# Patient Record
Sex: Female | Born: 1955 | Race: White | Hispanic: No | Marital: Married | Smoking: Never smoker
Health system: Southern US, Community
[De-identification: ages and names within clinical notes are randomized; demographics above are authoritative.]

## PROBLEM LIST (undated history)

## (undated) DIAGNOSIS — I10 Essential (primary) hypertension: Secondary | ICD-10-CM

## (undated) DIAGNOSIS — E785 Hyperlipidemia, unspecified: Secondary | ICD-10-CM

## (undated) DIAGNOSIS — M1990 Unspecified osteoarthritis, unspecified site: Secondary | ICD-10-CM

## (undated) DIAGNOSIS — E1169 Type 2 diabetes mellitus with other specified complication: Secondary | ICD-10-CM

## (undated) DIAGNOSIS — K219 Gastro-esophageal reflux disease without esophagitis: Secondary | ICD-10-CM

## (undated) DIAGNOSIS — F419 Anxiety disorder, unspecified: Secondary | ICD-10-CM

## (undated) DIAGNOSIS — C801 Malignant (primary) neoplasm, unspecified: Secondary | ICD-10-CM

## (undated) DIAGNOSIS — R51 Headache: Secondary | ICD-10-CM

## (undated) DIAGNOSIS — N390 Urinary tract infection, site not specified: Secondary | ICD-10-CM

## (undated) DIAGNOSIS — E119 Type 2 diabetes mellitus without complications: Secondary | ICD-10-CM

## (undated) HISTORY — PX: ABDOMINAL HYSTERECTOMY: SHX81

## (undated) HISTORY — PX: RECTOCELE REPAIR: SHX761

## (undated) HISTORY — PX: KNEE ARTHROSCOPY: SUR90

## (undated) HISTORY — PX: CHOLECYSTECTOMY: SHX55

## (undated) HISTORY — PX: TOOTH EXTRACTION: SUR596

## (undated) HISTORY — PX: ENTEROCELE REPAIR: SHX623

## (undated) HISTORY — PX: TONSILLECTOMY: SUR1361

---
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
## 1987-09-05 HISTORY — PX: CHOLECYSTECTOMY: SHX55

## 2004-06-21 ENCOUNTER — Other Ambulatory Visit: Admission: RE | Admit: 2004-06-21 | Discharge: 2004-06-21 | Payer: Self-pay | Admitting: Gynecology

## 2004-06-29 ENCOUNTER — Encounter: Admission: RE | Admit: 2004-06-29 | Discharge: 2004-06-29 | Payer: Self-pay | Admitting: Gynecology

## 2004-11-14 ENCOUNTER — Encounter: Admission: RE | Admit: 2004-11-14 | Discharge: 2004-11-14 | Payer: Self-pay | Admitting: Orthopedic Surgery

## 2004-11-28 ENCOUNTER — Encounter: Admission: RE | Admit: 2004-11-28 | Discharge: 2004-11-28 | Payer: Self-pay | Admitting: Orthopedic Surgery

## 2005-02-28 ENCOUNTER — Encounter: Admission: RE | Admit: 2005-02-28 | Discharge: 2005-02-28 | Payer: Self-pay | Admitting: Gynecology

## 2005-06-23 ENCOUNTER — Other Ambulatory Visit: Admission: RE | Admit: 2005-06-23 | Discharge: 2005-06-23 | Payer: Self-pay | Admitting: Gynecology

## 2005-07-13 ENCOUNTER — Encounter: Payer: Self-pay | Admitting: Gynecology

## 2005-07-18 ENCOUNTER — Inpatient Hospital Stay (HOSPITAL_COMMUNITY): Admission: AD | Admit: 2005-07-18 | Discharge: 2005-07-19 | Payer: Self-pay | Admitting: Gynecology

## 2005-08-09 ENCOUNTER — Encounter: Admission: RE | Admit: 2005-08-09 | Discharge: 2005-08-09 | Payer: Self-pay | Admitting: Gynecology

## 2008-09-04 HISTORY — PX: COLONOSCOPY: SHX174

## 2009-09-04 DIAGNOSIS — C801 Malignant (primary) neoplasm, unspecified: Secondary | ICD-10-CM

## 2009-09-04 HISTORY — DX: Malignant (primary) neoplasm, unspecified: C80.1

## 2009-09-04 HISTORY — PX: ABDOMINAL HYSTERECTOMY: SHX81

## 2010-06-02 ENCOUNTER — Ambulatory Visit: Admission: RE | Admit: 2010-06-02 | Discharge: 2010-06-02 | Payer: Self-pay | Admitting: Gynecologic Oncology

## 2011-01-20 NOTE — Op Note (Signed)
[name], Lindsey [name] NO.:  [id number]   MEDICAL RECORD NO.:  [id number]          PATIENT TYPE:  AMB   LOCATION:  DAY                          FACILITY:  [facility]   PHYSICIAN:  Gretta Cool, M.D. 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   PROCEDURE:  Enterocele, rectocele repair, colposuspension with levator plate  repair.   SURGEON:  Gretta Cool, M.D.   ASSISTANT:  Almedia Balls. [name], M.D.   ANESTHESIA:  General oral tracheal.   DESCRIPTION OF PROCEDURE:  Under excellent general anesthesia with the  patient prepped and draped in the lithotomy position in [place] stirrups, the  procedure was begun by an incision in the posterior fourchette and then the  vaginal mucosa was undermined from the introitus to the apex of the vagina.  The Allis clamps were placed on the cut edges of the vaginal mucosa. The  mucosa was then dissected by blunt and sharp dissection from the mucosa.  Once all of the defects were identified, the cardinal uterosacral complex  was used to secure the detached vaginal fascia. The fascia was secured with  mattress suture of 2-0 Novofil. The remaining interim between the two  ligaments fascial detachment was repaired with interrupted sutures of #0  Vicryl. The fascia was secured to the base of the cervix posteriorly from  whence it had been torn. At this point, all of the fascial defects were  individually site specific repaired. Next, a suture of #0 Vicryl was used to  plicate the endopelvic fascia from uterosacral complex on the right to the  left. The entire posterior compartment endopelvic fascia was then  plicated  in the midline from the introitus to the apex of the vagina. The perineal  body muscles were then individually plicated with 2-0 Vicryl. The mucosa was  then trimmed and the upper layers of endopelvic fascia and mucosa closed by  subcuticular closure using 2-0 Vicryl all the way to the introitus. The  perineal incision was then closed with a running suture of 2-0 Vicryl.  At  this point, the bladder was filled with approximately 400 mL of irrigation  fluid and [place] suprapubic Cystocath placed and secured with 2-0 Novofil.  At this point, the procedure was terminated without complication. The  patient returned to the recovery room in excellent condition. Her introitus  was generous by digital opening. All of pelvic floor seemed very well  supported.          ______________________________  Gretta Cool, M.D.    CWL/MEDQ  D:  07/18/2005  T:  07/18/2005  Job:  [id number]   cc:   [facility]

## 2011-01-20 NOTE — H&P (Signed)
Lindsey [name], Lindsey [name] NO.:  [id number]   MEDICAL RECORD NO.:  [id number]          PATIENT TYPE:  INP   LOCATION:  9305                          FACILITY:  [facility]   PHYSICIAN:  Gretta Cool, M.D. DATE OF BIRTH:  08/08/1956   DATE OF ADMISSION:  07/18/2005  DATE OF DISCHARGE:                                HISTORY & PHYSICAL   CHIEF COMPLAINT:  Pelvic support problems.   HISTORY OF PRESENT ILLNESS:  A 55 year old, gravida 2, para 2, with a  history of enterocele and rectocele, and fascial detachment with levator  plate weakness, and widening of the genital hiatus.  She reports obstetric  difficulty particularly with her first child, and progressive difficulty  with emptying her bowel, and with bulge of her rectum through the introitus.  She has had progressive worsening of her pelvic organ prolapse.  She has a  predominantly posterior compartment descent and weakness.  She has no  significant difficulties with incontinence.  She is now admitted for  definitive therapy of a grade 3 to 4 rectocele, enterocele, and severe  fascial detachment with levator diastasis.  She is now admitted for  definitive therapy by posterior and enterocele repair, colposuspension  repair of left levator plate tears.   PAST MEDICAL HISTORY:  Usual childhood diseases without sequelae.   MEDICAL ILLNESSES:  None.   PREVIOUS SURGERY:  1.  T&A [facility].  2.  Tubal ligation, 1981.  3.  Cholecystectomy, 1989   ALLERGIES:  ERYTHROMYCIN.   She denies ethanol, tobacco, substance abuse.   FAMILY HISTORY:  Father has type 2 diabetes.  Her maternal grandmother had  lung cancer.  Father has had skin cancers.  No other familial tendencies.  Two sisters living and well.   REVIEW OF SYSTEMS:  HEENT:  Denies symptoms.  CARDIORESPIRATORY:  Denies  asthma, cough, bronchitis, shortness of breath.  GI/GU:  Denies frequency,  urgency, dysuria, change in bowel habits, food intolerance.   She does have  to splint to achieve evacuation of her bowel   PHYSICAL EXAMINATION:  GENERAL:  A well-developed, well-nourished, white  female significantly over ideal weight with BMI of 33.  HEENT:  Pupils equal, react to light and accommodate.  Fundi benign.  Oropharynx clear.  NECK:  Supple without masses or enlargement.  CHEST:  Clear __________  .  BREASTS:  Without masses, nodes, nipple discharge.  Node bearing areas  negative.  HEART:  Regular rhythm without murmur or cardiac enlargement.  ABDOMEN:  Soft with a large panniculus but without mass or organomegaly  palpable.  PELVIC:  External genitalia normal female.  Vagina, widening of the genital  hiatus.  She has evidence of levator plate separation at level I.  Her upper  levators appear fine.  She has severe fascial detachment from the apex of  the vagina to the cervix and descent of the fascia to the lower third of the  vagina.  Her rectocele can be pulled out through the introitus and bulges  there with straining.  Cervix seems reasonably well supported.  Adnexa  clear.  Anterior compartment  is all well-supported.  Rectovaginal exam  confirms.  EXTREMITIES:  Negative.  NEUROLOGIC:  Physiologic.   IMPRESSION:  1.  Pelvic organ prolapse with enterocele/rectocele, grade 3.  2.  Fascial detachment and levator plate separation.  3.  Overweight with BMI 33.  4.  Perimenopausal.  5.  Hypertension on verapamil and Dyazide.   RECOMMENDATIONS:  Definitive therapy as above. The risks, benefits, and  alternatives discussed in detail.  Options and alternatives __________           ______________________________  Gretta Cool, M.D.     CWL/MEDQ  D:  07/19/2005  T:  07/19/2005  Job:  [id number]   cc:   [facility]

## 2012-09-04 HISTORY — PX: ABDOMINAL HYSTERECTOMY: SHX81

## 2013-03-04 DIAGNOSIS — D3910 Neoplasm of uncertain behavior of unspecified ovary: Secondary | ICD-10-CM

## 2013-03-04 DIAGNOSIS — C569 Malignant neoplasm of unspecified ovary: Secondary | ICD-10-CM | POA: Insufficient documentation

## 2013-03-04 HISTORY — DX: Neoplasm of uncertain behavior of unspecified ovary: D39.10

## 2013-03-25 ENCOUNTER — Other Ambulatory Visit: Payer: Self-pay | Admitting: Orthopedic Surgery

## 2013-03-25 MED ORDER — BUPIVACAINE LIPOSOME 1.3 % IJ SUSP: 20.0000 mL | Freq: Once | INTRAMUSCULAR | Status: DC

## 2013-03-25 MED ORDER — DEXAMETHASONE SODIUM PHOSPHATE 10 MG/ML IJ SOLN: 10.0000 mg | Freq: Once | INTRAMUSCULAR | Status: DC

## 2013-03-25 NOTE — Progress Notes (Signed)
Preoperative surgical orders have been place into the Epic hospital system for Lindsey [name] on 03/25/2013, 6:37 PM  by Patrica Duel for surgery on 04/07/2013.  Preop Total Knee orders including Experal, PO Tylenol, and IV Decadron as long as there are no contraindications to the above medications. [name], PA-C

## 2013-03-26 ENCOUNTER — Encounter (HOSPITAL_COMMUNITY): Payer: Self-pay | Admitting: Pharmacy Technician

## 2013-03-31 ENCOUNTER — Other Ambulatory Visit (HOSPITAL_COMMUNITY): Payer: Self-pay | Admitting: *Deleted

## 2013-04-01 ENCOUNTER — Encounter (HOSPITAL_COMMUNITY)
Admission: RE | Admit: 2013-04-01 | Discharge: 2013-04-01 | Disposition: A | Discharge status: Home / Self Care | Payer: BC Managed Care – PPO | Source: Ambulatory Visit | Attending: Orthopedic Surgery | Admitting: Orthopedic Surgery

## 2013-04-01 ENCOUNTER — Encounter (HOSPITAL_COMMUNITY): Payer: Self-pay

## 2013-04-01 ENCOUNTER — Other Ambulatory Visit: Payer: Self-pay | Admitting: Orthopedic Surgery

## 2013-04-01 DIAGNOSIS — Z01810 Encounter for preprocedural cardiovascular examination: Secondary | ICD-10-CM | POA: Insufficient documentation

## 2013-04-01 DIAGNOSIS — R9431 Abnormal electrocardiogram [ECG] [EKG]: Secondary | ICD-10-CM | POA: Insufficient documentation

## 2013-04-01 DIAGNOSIS — Z01812 Encounter for preprocedural laboratory examination: Secondary | ICD-10-CM | POA: Insufficient documentation

## 2013-04-01 HISTORY — DX: Gastro-esophageal reflux disease without esophagitis: K21.9

## 2013-04-01 HISTORY — DX: Unspecified osteoarthritis, unspecified site: M19.90

## 2013-04-01 HISTORY — DX: Type 2 diabetes mellitus without complications: E11.9

## 2013-04-01 HISTORY — DX: Essential (primary) hypertension: I10

## 2013-04-01 HISTORY — DX: Urinary tract infection, site not specified: N39.0

## 2013-04-01 HISTORY — DX: Malignant (primary) neoplasm, unspecified: C80.1

## 2013-04-01 HISTORY — DX: Headache: R51

## 2013-04-01 LAB — COMPREHENSIVE METABOLIC PANEL
ALT: 20 U/L (ref 0–35)
AST: 20 U/L (ref 0–37)
Albumin: 4 g/dL (ref 3.5–5.2)
Alkaline Phosphatase: 94 U/L (ref 39–117)
BUN: 14 mg/dL (ref 6–23)
CO2: 27 mEq/L (ref 19–32)
Calcium: 10.2 mg/dL (ref 8.4–10.5)
Chloride: 98 mEq/L (ref 96–112)
Creatinine, Ser: 0.73 mg/dL (ref 0.50–1.10)
GFR calc Af Amer: >90 mL/min (ref >90)
GFR calc non Af Amer: >90 mL/min (ref >90)
Glucose, Bld: 120 mg/dL — ABNORMAL HIGH (ref 70–99)
Potassium: 3.9 mEq/L (ref 3.5–5.1)
Sodium: 136 mEq/L (ref 135–145)
Total Bilirubin: 0.3 mg/dL (ref 0.3–1.2)
Total Protein: 7.5 g/dL (ref 6.0–8.3)

## 2013-04-01 LAB — CBC
HCT: 41 % (ref 36.0–46.0)
Hemoglobin: 13.6 g/dL (ref 12.0–15.0)
MCH: 27.6 pg (ref 26.0–34.0)
MCHC: 33.2 g/dL (ref 30.0–36.0)
MCV: 83.3 fL (ref 78.0–100.0)
Platelets: 436 10*3/uL — ABNORMAL HIGH (ref 150–400)
RBC: 4.92 MIL/uL (ref 3.87–5.11)
RDW: 13.6 % (ref 11.5–15.5)
WBC: 9.7 10*3/uL (ref 4.0–10.5)

## 2013-04-01 LAB — URINALYSIS, ROUTINE W REFLEX MICROSCOPIC
Bilirubin Urine: NEGATIVE
Glucose, UA: NEGATIVE mg/dL
Hgb urine dipstick: NEGATIVE
Ketones, ur: NEGATIVE mg/dL
Leukocytes, UA: NEGATIVE
Nitrite: NEGATIVE
Protein, ur: NEGATIVE mg/dL
Specific Gravity, Urine: 1.009 (ref 1.005–1.030)
Urobilinogen, UA: 0.2 mg/dL (ref 0.0–1.0)
pH: 6 (ref 5.0–8.0)

## 2013-04-01 LAB — APTT: aPTT: 31 seconds (ref 24–37)

## 2013-04-01 LAB — SURGICAL PCR SCREEN
MRSA, PCR: NEGATIVE
Staphylococcus aureus: NEGATIVE

## 2013-04-01 LAB — PROTIME-INR
INR: 0.92 (ref 0.00–1.49)
Prothrombin Time: 12.2 seconds (ref 11.6–15.2)

## 2013-04-01 NOTE — Progress Notes (Signed)
Chest x ray, EKG 11/13 chart

## 2013-04-01 NOTE — Patient Instructions (Addendum)
20 KARYS MECKLEY  04/01/2013   Your procedure is scheduled on:  04/07/13  MONDAY  Report to [facility] at    0530   AM.  Call this number if you have problems the morning of surgery: [phone]       Remember:   Do not eat food  Or drink :After Midnight. Sunday NIGHT   Take these medicines the morning of surgery with A SIP OF WATER:  PROLISEC,  VERAPAMIL                          DO NOT take any diabetes medicine morning of surgery  .  Contacts, dentures or partial plates can not be worn to surgery  Leave suitcase in the car. After surgery it may be brought to your room.  For patients admitted to the hospital, checkout time is 11:00 AM day of  discharge.             SPECIAL INSTRUCTIONS- SEE [place] PREPARING FOR SURGERY INSTRUCTION SHEET-     DO NOT WEAR JEWELRY, LOTIONS, POWDERS, OR PERFUMES.  WOMEN-- DO NOT SHAVE LEGS OR UNDERARMS FOR 12 HOURS BEFORE SHOWERS. MEN MAY SHAVE FACE.  Patients discharged the day of surgery will not be allowed to drive home. IF going home the day of surgery, you must have a driver and someone to stay with you for the first 24 hours  Name and phone number of your driver:       ADMISSION                                                                 Please read over the following fact sheets that you were given: MRSA Information, Incentive Spirometry Sheet, Blood Transfusion Sheet  Information                                                                                   Effa Yarrow  PST 336  [phone]                 FAILURE TO FOLLOW THESE INSTRUCTIONS MAY RESULT IN  CANCELLATION   OF YOUR SURGERY                                                  Patient Signature _____________________________

## 2013-04-01 NOTE — Progress Notes (Signed)
EKG 11/13 reviewed with report medical history by dr Rica Mast. Ordered repeat EKG today.  Attempted to obtain old EKG from [facility] for review- left message

## 2013-04-06 ENCOUNTER — Other Ambulatory Visit: Payer: Self-pay | Admitting: Orthopedic Surgery

## 2013-04-06 NOTE — H&P (Signed)
Lindsey [name]  DOB: 05-28-1956 Married / Language: English / Race: White Female  Date of Admission:  04/07/2013  Chief Complaint:  Right Knee Pain  History of Present Illness The patient is a 57 year old female who comes in for a preoperative History and Physical. The patient is scheduled for a right total knee arthroplasty to be performed by Dr. Gus Rankin. Aluisio, MD at [facility] on 05/12/2013. The patient is a 57 year old female who presents with knee complaints. The patient was seen for a second opinion. The patient reports right knee symptoms including: pain which began 6 year(s) (or more) ago without any known injury (although she has had multiple falls over the years). Prior to being seen, the patient was previously evaluated by a colleague (at [employer]) 1 year(s) ago. Previous work-up for this problem has included knee x-rays. Past treatment for this problem has included intra-articular injection of corticosteroids (and Supartz). Note for "Knee pain": She had surgery on the knee in 2008 in [place]. She was not able to obtain any records from that. She states the knee is very painful now, which is keeping her from doing the things she needs to do on a daily basis. The patient's knee has gotten progressively worse over time. It is now hurting her at all times. It is limited what she can and cannot do. She is concerned about it giving out. She has had cortisone injections as well as Visco supplement injections with no benefit. She is at a stage now where she needs to do something to get this knee better. She wants to be more active, but the knee is preventing her from doing so. She is ready to proceed with surgery. They have been treated conservatively in the past for the above stated problem and despite conservative measures, they continue to have progressive pain and severe functional limitations and dysfunction. They have failed non-operative management  including home exercise, medications, and injections. It is felt that they would benefit from undergoing total joint replacement. Risks and benefits of the procedure have been discussed with the patient and they elect to proceed with surgery. There are no active contraindications to surgery such as ongoing infection or rapidly progressive neurological disease.   Problem List Primary osteoarthritis of one knee (715.16)   Allergies Sulfanilamide *CHEMICALS*. Hives, Vomiting. Erythromycin *MACROLIDES*. Hives, Nausea. TraMADol HCl *ANALGESICS - OPIOID*. Nausea, Dizziness.   Family History Cerebrovascular Accident. grandfather fathers side Cancer. grandmother mothers side Congestive Heart Failure. grandmother mothers side Chronic Obstructive Lung Disease. grandmother mothers side Diabetes Mellitus. grandfather fathers side Heart Disease. grandmother mothers side, grandfather mothers side, grandmother fathers side and grandfather fathers side Drug / Alcohol Addiction. sister Hypertension. mother and sister Heart disease in female family member before age 74   Social History Illicit drug use. no Exercise. Exercises monthly; does individual sport Marital status. married Living situation. live with spouse Drug/Alcohol Rehab (Currently). no Alcohol use. current drinker; only occasionally per week Current work status. working full time Children. 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 1 Tobacco use. never smoker Previously in rehab. no   Medication History Ambien (10MG  Tablet, Oral) Active. Imitrex (100MG  Tablet, Oral) Active. (prn) Pravastatin Sodium (40MG  Tablet, Oral) Active. MetFORMIN HCl ER (500MG  Tablet ER 24HR, Oral) Active. Verapamil HCl ER (240MG  Tablet ER, Oral) Active. Lisinopril-Hydrochlorothiazide (20-25MG  Tablet, Oral) Active. Omeprazole (20MG   Tablet DR, Oral) Active. Fish Oil (1200MG  Capsule, Oral)  Active. Aspirin EC (81MG  Tablet DR, Oral) Active.   Past Surgical History Tubal Ligation. Date: 4. Gallbladder Surgery. Date: 32. laporoscopic Arthroscopy of Knee. Date: 2008. right Tonsillectomy. Date: 31. Hysterectomy. Date: 2011. complete (non-cancerous) Enterocele / Rectocele Repair. Date: 2006.   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 Past History   Review of Systems General:Present- Night Sweats. Not Present- Chills, Fever, Fatigue, Weight Gain, Weight Loss and Memory Loss. Skin:Not Present- Hives, Itching, Rash, Eczema and Lesions. HEENT:Present- Tinnitus. Not Present- Headache, Double Vision, Visual Loss, Hearing Loss and Dentures. Respiratory:Not Present- Shortness of breath with exertion, Shortness of breath at rest, Allergies, Coughing up blood and Chronic Cough. Cardiovascular:Not Present- Chest Pain, Racing/skipping heartbeats, Difficulty Breathing Lying Down, Murmur, Swelling and Palpitations. Gastrointestinal:Present- Heartburn. Not Present- Bloody Stool, Abdominal Pain, Vomiting, Nausea, Constipation, Diarrhea, Difficulty Swallowing, Jaundice and Loss of appetitie. Female Genitourinary:Present- Urinating at Night. Not Present- Blood in Urine, Urinary frequency, Weak urinary stream, Discharge, Flank Pain, Incontinence, Painful Urination, Urgency and Urinary Retention. Musculoskeletal:Present- Muscle Pain, Joint Pain, Back Pain, Morning Stiffness and Spasms. Not Present- Muscle Weakness and Joint Swelling. Neurological:Not Present- Tremor, Dizziness, Blackout spells, Paralysis, Difficulty with balance and Weakness. Psychiatric:Not Present- Insomnia.   Vitals Weight: 193  lb Height: 65 in Weight was reported by patient. Height was reported by patient. Body Surface Area: 2 m Body Mass Index: 32.12 kg/m Pulse: 92 (Regular) Resp.: 16 (Unlabored) BP: 134/78 (Sitting, Right Arm, Standard)    Physical Exam The physical exam findings are as follows:  Note: Patient is a 57 year old female with continued right knee pain.   General Mental Status - Alert, cooperative and good historian. General Appearance- pleasant. Not in acute distress. Orientation- Oriented X3. Build & Nutrition- Well nourished and Well developed.   Head and Neck Head- normocephalic, atraumatic . Neck Global Assessment- supple. no bruit auscultated on the right and no bruit auscultated on the left.   Eye Vision- Wears corrective lenses. Pupil- Bilateral- Regular and Round. Motion- Bilateral- EOMI.   Chest and Lung Exam Auscultation: Breath sounds:- clear at anterior chest wall and - clear at posterior chest wall. Adventitious sounds:- No Adventitious sounds.   Cardiovascular Auscultation:Rhythm- Regular rate and rhythm. Heart Sounds- S1 WNL and S2 WNL. Murmurs & Other Heart Sounds:Auscultation of the heart reveals - No Murmurs.   Abdomen Palpation/Percussion:Tenderness- Abdomen is non-tender to palpation. Rigidity (guarding)- Abdomen is soft. Auscultation:Auscultation of the abdomen reveals - Bowel sounds normal.   Female Genitourinary Not done, not pertinent to present illness  Musculoskeletal  On exam she is alert and oriented with no apparent distress. Her hips show normal range of motion with no discomfort. The left knee no effusion. Range about 0 to 125 with no tenderness or instability. Right knee no effusion. Varus deformity noted. Range 5 to 120 with moderate crepitus on range of motion. Tenderness medial greater than lateral with no instability noted.  RADIOGRAPHS: Reviewed AP both knees and lateral showing  significant bone on bone arthritis of the medial and patellofemoral compartments of that right knee.  Assessment & Plan Primary osteoarthritis of one knee (715.16) Impression: Right Knee  Note: Plan is for a Right Total Knee Replacement by Dr. Lequita Halt.  Plan is to go home following surgery.  PCP - Dr. Mickey Farber and Gus Height PA-C - Patient has been seen preoperatively and felt to be stable for surgery.  The patient does not have any contraindications and will recieve TXA (tranexamic acid) prior to surgery.  Time Spent ~ 20 minutes  Signed electronically by Lauraine Rinne, III PA-C

## 2013-04-07 ENCOUNTER — Encounter (HOSPITAL_COMMUNITY)
Admission: RE | Disposition: A | Discharge status: Home Health | Payer: Self-pay | Source: Ambulatory Visit | Attending: Orthopedic Surgery

## 2013-04-07 ENCOUNTER — Inpatient Hospital Stay (HOSPITAL_COMMUNITY): Payer: BC Managed Care – PPO | Admitting: Anesthesiology

## 2013-04-07 ENCOUNTER — Inpatient Hospital Stay (HOSPITAL_COMMUNITY)
Admission: RE | Admit: 2013-04-07 | Discharge: 2013-04-09 | DRG: 209 | Disposition: A | Discharge status: Home Health | Payer: BC Managed Care – PPO | Source: Ambulatory Visit | Attending: Orthopedic Surgery | Admitting: Orthopedic Surgery

## 2013-04-07 ENCOUNTER — Encounter (HOSPITAL_COMMUNITY): Payer: Self-pay | Admitting: *Deleted

## 2013-04-07 ENCOUNTER — Encounter (HOSPITAL_COMMUNITY): Payer: Self-pay | Admitting: Anesthesiology

## 2013-04-07 DIAGNOSIS — Z96651 Presence of right artificial knee joint: Secondary | ICD-10-CM

## 2013-04-07 DIAGNOSIS — K219 Gastro-esophageal reflux disease without esophagitis: Secondary | ICD-10-CM | POA: Diagnosis present

## 2013-04-07 DIAGNOSIS — Z6832 Body mass index (BMI) 32.0-32.9, adult: Secondary | ICD-10-CM

## 2013-04-07 DIAGNOSIS — E7800 Pure hypercholesterolemia, unspecified: Secondary | ICD-10-CM | POA: Diagnosis present

## 2013-04-07 DIAGNOSIS — E119 Type 2 diabetes mellitus without complications: Secondary | ICD-10-CM | POA: Diagnosis present

## 2013-04-07 DIAGNOSIS — M1710 Unilateral primary osteoarthritis, unspecified knee: Principal | ICD-10-CM | POA: Diagnosis present

## 2013-04-07 DIAGNOSIS — Z01812 Encounter for preprocedural laboratory examination: Secondary | ICD-10-CM

## 2013-04-07 DIAGNOSIS — I10 Essential (primary) hypertension: Secondary | ICD-10-CM | POA: Diagnosis present

## 2013-04-07 DIAGNOSIS — I152 Hypertension secondary to endocrine disorders: Secondary | ICD-10-CM | POA: Diagnosis present

## 2013-04-07 DIAGNOSIS — E669 Obesity, unspecified: Secondary | ICD-10-CM | POA: Diagnosis present

## 2013-04-07 DIAGNOSIS — D62 Acute posthemorrhagic anemia: Secondary | ICD-10-CM | POA: Diagnosis not present

## 2013-04-07 DIAGNOSIS — E871 Hypo-osmolality and hyponatremia: Secondary | ICD-10-CM | POA: Diagnosis not present

## 2013-04-07 HISTORY — PX: TOTAL KNEE ARTHROPLASTY: SHX125

## 2013-04-07 LAB — GLUCOSE, CAPILLARY
Glucose-Capillary: 125 mg/dL — ABNORMAL HIGH (ref 70–99)
Glucose-Capillary: 114 mg/dL — ABNORMAL HIGH (ref 70–99)
Glucose-Capillary: 115 mg/dL — ABNORMAL HIGH (ref 70–99)
Glucose-Capillary: 157 mg/dL — ABNORMAL HIGH (ref 70–99)
Glucose-Capillary: 195 mg/dL — ABNORMAL HIGH (ref 70–99)

## 2013-04-07 LAB — TYPE AND SCREEN
ABO/RH(D): O NEG
Antibody Screen: NEGATIVE

## 2013-04-07 LAB — ABO/RH: ABO/RH(D): O NEG

## 2013-04-07 SURGERY — ARTHROPLASTY, KNEE, TOTAL
Anesthesia: General | Site: Knee | Laterality: Right | Wound class: Clean

## 2013-04-07 MED ORDER — DOCUSATE SODIUM 100 MG PO CAPS
100.0000 mg | ORAL_CAPSULE | Freq: Two times a day (BID) | ORAL | Status: DC
  Administered 2013-04-07 – 2013-04-09 (×4): 100 mg via ORAL

## 2013-04-07 MED ORDER — MORPHINE SULFATE 2 MG/ML IJ SOLN
1.0000 mg | INTRAMUSCULAR | Status: DC | PRN
  Administered 2013-04-07 (×2): 2 mg via INTRAVENOUS
  Filled 2013-04-07 (×2): qty 1

## 2013-04-07 MED ORDER — PHENOL 1.4 % MT LIQD: 1.0000 | OROMUCOSAL | Status: DC | PRN

## 2013-04-07 MED ORDER — ONDANSETRON HCL 4 MG/2ML IJ SOLN: 4.0000 mg | Freq: Four times a day (QID) | INTRAMUSCULAR | Status: DC | PRN

## 2013-04-07 MED ORDER — METFORMIN HCL 500 MG PO TABS
500.0000 mg | ORAL_TABLET | Freq: Two times a day (BID) | ORAL | Status: DC
  Administered 2013-04-07 – 2013-04-08 (×2): 500 mg via ORAL
  Filled 2013-04-07 (×4): qty 1

## 2013-04-07 MED ORDER — SODIUM CHLORIDE 0.9 % IR SOLN
Status: DC | PRN
  Administered 2013-04-07: 1000 mL

## 2013-04-07 MED ORDER — MIDAZOLAM HCL 5 MG/5ML IJ SOLN
INTRAMUSCULAR | Status: DC | PRN
  Administered 2013-04-07: 2 mg via INTRAVENOUS

## 2013-04-07 MED ORDER — ZOLPIDEM TARTRATE 5 MG PO TABS
5.0000 mg | ORAL_TABLET | Freq: Every evening | ORAL | Status: DC | PRN
  Administered 2013-04-07 – 2013-04-08 (×2): 5 mg via ORAL
  Filled 2013-04-07: qty 1
  Filled 2013-04-07: qty 2

## 2013-04-07 MED ORDER — PROPOFOL INFUSION 10 MG/ML OPTIME
INTRAVENOUS | Status: DC | PRN
  Administered 2013-04-07: 75 ug/kg/min via INTRAVENOUS

## 2013-04-07 MED ORDER — BISACODYL 10 MG RE SUPP: 10.0000 mg | Freq: Every day | RECTAL | Status: DC | PRN

## 2013-04-07 MED ORDER — MENTHOL 3 MG MT LOZG: 1.0000 | LOZENGE | OROMUCOSAL | Status: DC | PRN

## 2013-04-07 MED ORDER — ONDANSETRON HCL 4 MG PO TABS: 4.0000 mg | ORAL_TABLET | Freq: Four times a day (QID) | ORAL | Status: DC | PRN

## 2013-04-07 MED ORDER — METHOCARBAMOL 500 MG PO TABS
500.0000 mg | ORAL_TABLET | Freq: Four times a day (QID) | ORAL | Status: DC | PRN
  Administered 2013-04-07 – 2013-04-09 (×5): 500 mg via ORAL
  Filled 2013-04-07 (×7): qty 1

## 2013-04-07 MED ORDER — METHOCARBAMOL 100 MG/ML IJ SOLN
500.0000 mg | Freq: Four times a day (QID) | INTRAVENOUS | Status: DC | PRN
  Filled 2013-04-07: qty 5

## 2013-04-07 MED ORDER — PROMETHAZINE HCL 25 MG/ML IJ SOLN: 6.2500 mg | INTRAMUSCULAR | Status: DC | PRN

## 2013-04-07 MED ORDER — DEXAMETHASONE 4 MG PO TABS
10.0000 mg | ORAL_TABLET | Freq: Every day | ORAL | Status: AC
  Administered 2013-04-08: 10 mg via ORAL
  Filled 2013-04-07: qty 1

## 2013-04-07 MED ORDER — DEXAMETHASONE SODIUM PHOSPHATE 10 MG/ML IJ SOLN
10.0000 mg | Freq: Once | INTRAMUSCULAR | Status: AC
  Administered 2013-04-07: 10 mg via INTRAVENOUS

## 2013-04-07 MED ORDER — METOCLOPRAMIDE HCL 5 MG PO TABS
5.0000 mg | ORAL_TABLET | Freq: Three times a day (TID) | ORAL | Status: DC | PRN
  Filled 2013-04-07: qty 2

## 2013-04-07 MED ORDER — METOCLOPRAMIDE HCL 5 MG/ML IJ SOLN: 5.0000 mg | Freq: Three times a day (TID) | INTRAMUSCULAR | Status: DC | PRN

## 2013-04-07 MED ORDER — BUPIVACAINE IN DEXTROSE 0.75-8.25 % IT SOLN
INTRATHECAL | Status: DC | PRN
  Administered 2013-04-07: 2 mL via INTRATHECAL

## 2013-04-07 MED ORDER — SIMVASTATIN 20 MG PO TABS: 20.0000 mg | ORAL_TABLET | Freq: Every day | ORAL | Status: DC

## 2013-04-07 MED ORDER — ELETRIPTAN HYDROBROMIDE 40 MG PO TABS
40.0000 mg | ORAL_TABLET | ORAL | Status: DC | PRN
  Filled 2013-04-07: qty 1

## 2013-04-07 MED ORDER — TRANEXAMIC ACID 100 MG/ML IV SOLN
1000.0000 mg | INTRAVENOUS | Status: AC
  Administered 2013-04-07: 1000 mg via INTRAVENOUS
  Filled 2013-04-07: qty 10

## 2013-04-07 MED ORDER — ONDANSETRON HCL 4 MG/2ML IJ SOLN
INTRAMUSCULAR | Status: DC | PRN
  Administered 2013-04-07: 4 mg via INTRAVENOUS

## 2013-04-07 MED ORDER — ACETAMINOPHEN 500 MG PO TABS
1000.0000 mg | ORAL_TABLET | Freq: Four times a day (QID) | ORAL | Status: AC
  Administered 2013-04-07 – 2013-04-08 (×4): 1000 mg via ORAL
  Filled 2013-04-07 (×4): qty 2

## 2013-04-07 MED ORDER — DEXAMETHASONE SODIUM PHOSPHATE 10 MG/ML IJ SOLN
10.0000 mg | Freq: Every day | INTRAMUSCULAR | Status: AC
  Filled 2013-04-07: qty 1

## 2013-04-07 MED ORDER — BUPIVACAINE LIPOSOME 1.3 % IJ SUSP
INTRAMUSCULAR | Status: DC | PRN
  Administered 2013-04-07: 20 mL

## 2013-04-07 MED ORDER — SODIUM CHLORIDE 0.9 % IJ SOLN
INTRAMUSCULAR | Status: DC | PRN
  Administered 2013-04-07: 30 mL

## 2013-04-07 MED ORDER — ATORVASTATIN CALCIUM 10 MG PO TABS
10.0000 mg | ORAL_TABLET | Freq: Every day | ORAL | Status: DC
  Administered 2013-04-07 – 2013-04-08 (×2): 10 mg via ORAL
  Filled 2013-04-07 (×3): qty 1

## 2013-04-07 MED ORDER — OXYCODONE HCL 5 MG PO TABS
5.0000 mg | ORAL_TABLET | ORAL | Status: DC | PRN
  Administered 2013-04-07 – 2013-04-09 (×12): 10 mg via ORAL
  Filled 2013-04-07 (×12): qty 2

## 2013-04-07 MED ORDER — ACETAMINOPHEN 500 MG PO TABS
1000.0000 mg | ORAL_TABLET | Freq: Once | ORAL | Status: AC
  Administered 2013-04-07: 1000 mg via ORAL
  Filled 2013-04-07: qty 2

## 2013-04-07 MED ORDER — SODIUM CHLORIDE 0.9 % IV SOLN: INTRAVENOUS | Status: DC

## 2013-04-07 MED ORDER — LIDOCAINE HCL (CARDIAC) 20 MG/ML IV SOLN
INTRAVENOUS | Status: DC | PRN
  Administered 2013-04-07: 100 mg via INTRAVENOUS

## 2013-04-07 MED ORDER — CEFAZOLIN SODIUM-DEXTROSE 2-3 GM-% IV SOLR
2.0000 g | INTRAVENOUS | Status: AC
  Administered 2013-04-07: 2 g via INTRAVENOUS

## 2013-04-07 MED ORDER — LACTATED RINGERS IV SOLN
INTRAVENOUS | Status: DC | PRN
  Administered 2013-04-07: 08:00:00 via INTRAVENOUS

## 2013-04-07 MED ORDER — BUPIVACAINE LIPOSOME 1.3 % IJ SUSP
20.0000 mL | Freq: Once | INTRAMUSCULAR | Status: DC
  Filled 2013-04-07: qty 20

## 2013-04-07 MED ORDER — STERILE WATER FOR IRRIGATION IR SOLN
Status: DC | PRN
  Administered 2013-04-07: 3000 mL

## 2013-04-07 MED ORDER — CEFAZOLIN SODIUM 1-5 GM-% IV SOLN
1.0000 g | Freq: Four times a day (QID) | INTRAVENOUS | Status: AC
  Administered 2013-04-07 (×2): 1 g via INTRAVENOUS
  Filled 2013-04-07 (×2): qty 50

## 2013-04-07 MED ORDER — POTASSIUM CHLORIDE IN NACL 20-0.9 MEQ/L-% IV SOLN
INTRAVENOUS | Status: DC
  Administered 2013-04-07: 1000 mL via INTRAVENOUS
  Filled 2013-04-07 (×3): qty 1000

## 2013-04-07 MED ORDER — FENTANYL CITRATE 0.05 MG/ML IJ SOLN
INTRAMUSCULAR | Status: DC | PRN
  Administered 2013-04-07: 50 ug via INTRAVENOUS

## 2013-04-07 MED ORDER — BUPIVACAINE HCL 0.25 % IJ SOLN
INTRAMUSCULAR | Status: DC | PRN
  Administered 2013-04-07: 20 mL

## 2013-04-07 MED ORDER — TRAMADOL HCL 50 MG PO TABS: 50.0000 mg | ORAL_TABLET | Freq: Four times a day (QID) | ORAL | Status: DC | PRN

## 2013-04-07 MED ORDER — KETOROLAC TROMETHAMINE 15 MG/ML IJ SOLN
15.0000 mg | Freq: Four times a day (QID) | INTRAMUSCULAR | Status: AC | PRN
  Administered 2013-04-07: 15 mg via INTRAVENOUS
  Filled 2013-04-07: qty 1

## 2013-04-07 MED ORDER — HYDROMORPHONE HCL PF 1 MG/ML IJ SOLN: 0.2500 mg | INTRAMUSCULAR | Status: DC | PRN

## 2013-04-07 MED ORDER — SUMATRIPTAN SUCCINATE 100 MG PO TABS
100.0000 mg | ORAL_TABLET | ORAL | Status: DC | PRN
  Filled 2013-04-07: qty 1

## 2013-04-07 MED ORDER — POLYETHYLENE GLYCOL 3350 17 G PO PACK: 17.0000 g | PACK | Freq: Every day | ORAL | Status: DC | PRN

## 2013-04-07 MED ORDER — VERAPAMIL HCL ER 240 MG PO TBCR
240.0000 mg | EXTENDED_RELEASE_TABLET | Freq: Every day | ORAL | Status: DC
  Administered 2013-04-08 – 2013-04-09 (×2): 240 mg via ORAL
  Filled 2013-04-07 (×4): qty 1

## 2013-04-07 MED ORDER — INSULIN ASPART 100 UNIT/ML ~~LOC~~ SOLN
0.0000 [IU] | Freq: Three times a day (TID) | SUBCUTANEOUS | Status: DC
  Administered 2013-04-08: 2 [IU] via SUBCUTANEOUS
  Administered 2013-04-08 (×2): 3 [IU] via SUBCUTANEOUS
  Administered 2013-04-08: 2 [IU] via SUBCUTANEOUS
  Administered 2013-04-09: 3 [IU] via SUBCUTANEOUS

## 2013-04-07 MED ORDER — RIVAROXABAN 10 MG PO TABS
10.0000 mg | ORAL_TABLET | Freq: Every day | ORAL | Status: DC
  Administered 2013-04-08 – 2013-04-09 (×2): 10 mg via ORAL
  Filled 2013-04-07 (×3): qty 1

## 2013-04-07 MED ORDER — FLEET ENEMA 7-19 GM/118ML RE ENEM: 1.0000 | ENEMA | Freq: Once | RECTAL | Status: AC | PRN

## 2013-04-07 MED ORDER — PANTOPRAZOLE SODIUM 40 MG PO TBEC
40.0000 mg | DELAYED_RELEASE_TABLET | Freq: Every day | ORAL | Status: DC
  Administered 2013-04-07: 40 mg via ORAL
  Filled 2013-04-07 (×2): qty 1

## 2013-04-07 MED ORDER — DIPHENHYDRAMINE HCL 12.5 MG/5ML PO ELIX: 12.5000 mg | ORAL_SOLUTION | ORAL | Status: DC | PRN

## 2013-04-07 MED ORDER — 0.9 % SODIUM CHLORIDE (POUR BTL) OPTIME
TOPICAL | Status: DC | PRN
  Administered 2013-04-07: 1000 mL

## 2013-04-07 SURGICAL SUPPLY — 58 items
BAG SPEC THK2 15X12 ZIP CLS (MISCELLANEOUS) ×1
BAG ZIPLOCK 12X15 (MISCELLANEOUS) ×2 IMPLANT
BANDAGE ELASTIC 6 VELCRO ST LF (GAUZE/BANDAGES/DRESSINGS) ×2 IMPLANT
BANDAGE ESMARK 6X9 LF (GAUZE/BANDAGES/DRESSINGS) ×1 IMPLANT
BLADE SAG 18X100X1.27 (BLADE) ×2 IMPLANT
BLADE SAW SGTL 11.0X1.19X90.0M (BLADE) ×2 IMPLANT
BNDG CMPR 9X6 STRL LF SNTH (GAUZE/BANDAGES/DRESSINGS) ×1
BNDG ESMARK 6X9 LF (GAUZE/BANDAGES/DRESSINGS) ×2
BOWL SMART MIX CTS (DISPOSABLE) ×2 IMPLANT
CEMENT HV SMART SET (Cement) ×4 IMPLANT
CLOTH BEACON ORANGE TIMEOUT ST (SAFETY) ×2 IMPLANT
CUFF TOURN SGL QUICK 34 (TOURNIQUET CUFF) ×2
CUFF TRNQT CYL 34X4X40X1 (TOURNIQUET CUFF) ×1 IMPLANT
DECANTER SPIKE VIAL GLASS SM (MISCELLANEOUS) ×2 IMPLANT
DRAPE EXTREMITY T 121X128X90 (DRAPE) ×2 IMPLANT
DRAPE POUCH INSTRU U-SHP 10X18 (DRAPES) ×2 IMPLANT
DRAPE U-SHAPE 47X51 STRL (DRAPES) ×2 IMPLANT
DRSG ADAPTIC 3X8 NADH LF (GAUZE/BANDAGES/DRESSINGS) ×2 IMPLANT
DRSG EMULSION OIL 3X16 NADH (GAUZE/BANDAGES/DRESSINGS) ×1 IMPLANT
DRSG PAD ABDOMINAL 8X10 ST (GAUZE/BANDAGES/DRESSINGS) ×2 IMPLANT
DURAPREP 26ML APPLICATOR (WOUND CARE) ×2 IMPLANT
ELECT REM PT RETURN 9FT ADLT (ELECTROSURGICAL) ×2
ELECTRODE REM PT RTRN 9FT ADLT (ELECTROSURGICAL) ×1 IMPLANT
EVACUATOR 1/8 PVC DRAIN (DRAIN) ×2 IMPLANT
FACESHIELD LNG OPTICON STERILE (SAFETY) ×10 IMPLANT
GLOVE BIO SURGEON STRL SZ7.5 (GLOVE) IMPLANT
GLOVE BIO SURGEON STRL SZ8 (GLOVE) ×2 IMPLANT
GLOVE BIOGEL PI IND STRL 8 (GLOVE) ×1 IMPLANT
GLOVE BIOGEL PI INDICATOR 8 (GLOVE) ×1
GLOVE SURG SS PI 6.5 STRL IVOR (GLOVE) IMPLANT
GOWN STRL NON-REIN LRG LVL3 (GOWN DISPOSABLE) ×2 IMPLANT
GOWN STRL REIN XL XLG (GOWN DISPOSABLE) IMPLANT
HANDPIECE INTERPULSE COAX TIP (DISPOSABLE) ×2
IMMOBILIZER KNEE 20 (SOFTGOODS) ×4
IMMOBILIZER KNEE 20 THIGH 36 (SOFTGOODS) ×1 IMPLANT
KIT BASIN OR (CUSTOM PROCEDURE TRAY) ×2 IMPLANT
MANIFOLD NEPTUNE II (INSTRUMENTS) ×2 IMPLANT
NDL SAFETY ECLIPSE 18X1.5 (NEEDLE) ×2 IMPLANT
NEEDLE HYPO 18GX1.5 SHARP (NEEDLE) ×4
NS IRRIG 1000ML POUR BTL (IV SOLUTION) ×2 IMPLANT
PACK ICE MAXI GEL EZY WRAP (MISCELLANEOUS) ×1 IMPLANT
PACK TOTAL JOINT (CUSTOM PROCEDURE TRAY) ×2 IMPLANT
PADDING CAST COTTON 6X4 STRL (CAST SUPPLIES) ×5 IMPLANT
POSITIONER SURGICAL ARM (MISCELLANEOUS) ×2 IMPLANT
SET HNDPC FAN SPRY TIP SCT (DISPOSABLE) ×1 IMPLANT
SPONGE GAUZE 4X4 12PLY (GAUZE/BANDAGES/DRESSINGS) ×2 IMPLANT
STRIP CLOSURE SKIN 1/2X4 (GAUZE/BANDAGES/DRESSINGS) ×4 IMPLANT
SUCTION FRAZIER 12FR DISP (SUCTIONS) ×2 IMPLANT
SUT MNCRL AB 4-0 PS2 18 (SUTURE) ×2 IMPLANT
SUT VIC AB 2-0 CT1 27 (SUTURE) ×6
SUT VIC AB 2-0 CT1 TAPERPNT 27 (SUTURE) ×3 IMPLANT
SUT VLOC 180 0 24IN GS25 (SUTURE) ×2 IMPLANT
SYR 20CC LL (SYRINGE) ×2 IMPLANT
SYR 50ML LL SCALE MARK (SYRINGE) ×2 IMPLANT
TOWEL OR 17X26 10 PK STRL BLUE (TOWEL DISPOSABLE) ×4 IMPLANT
TRAY FOLEY CATH 14FRSI W/METER (CATHETERS) ×2 IMPLANT
WATER STERILE IRR 1500ML POUR (IV SOLUTION) ×2 IMPLANT
WRAP KNEE MAXI GEL POST OP (GAUZE/BANDAGES/DRESSINGS) ×2 IMPLANT

## 2013-04-07 NOTE — Op Note (Signed)
Pre-operative diagnosis- Osteoarthritis  Right knee(s)  Post-operative diagnosis- Osteoarthritis Right knee(s)  Procedure-  Right  Total Knee Arthroplasty  Surgeon- Gus Rankin. Troyce Gieske, MD  Assistant- [name], PA-C   Anesthesia-  Spinal EBL-* No blood loss amount entered *  Drains Hemovac  Tourniquet time-  Total Tourniquet Time Documented: Thigh (Right) - 34 minutes Total: Thigh (Right) - 34 minutes    Complications- None  Condition-PACU - hemodynamically stable.   Brief Clinical Note  Lindsey [name] is a 57 y.o. year old female with end stage OA of her right knee with progressively worsening pain and dysfunction. She has constant pain, with activity and at rest and significant functional deficits with difficulties even with ADLs. She has had extensive non-op management including analgesics, injections of cortisone and viscosupplements, and home exercise program, but remains in significant pain with significant dysfunction.Radiographs show bone on bone arthritis medial and patellofemoral. She presents now for right Total Knee Arthroplasty.    Procedure in detail---   The patient is brought into the operating room and positioned supine on the operating table. After successful administration of  Spinal,   a tourniquet is placed high on the  Right thigh(s) and the lower extremity is prepped and draped in the usual sterile fashion. Time out is performed by the operating team and then the  Right lower extremity is wrapped in Esmarch, knee flexed and the tourniquet inflated to 300 mmHg.       A midline incision is made with a ten blade through the subcutaneous tissue to the level of the extensor mechanism. A fresh blade is used to make a medial parapatellar arthrotomy. Soft tissue over the proximal medial tibia is subperiosteally elevated to the joint line with a knife and into the semimembranosus bursa with a Cobb elevator. Soft tissue over the proximal lateral tibia is elevated with  attention being paid to avoiding the patellar tendon on the tibial tubercle. The patella is everted, knee flexed 90 degrees and the ACL and PCL are removed. Findings are bone on bone medial and patellofemoral with large medial osteophytes.        The drill is used to create a starting hole in the distal femur and the canal is thoroughly irrigated with sterile saline to remove the fatty contents. The 5 degree Right  valgus alignment guide is placed into the femoral canal and the distal femoral cutting block is pinned to remove 10 mm off the distal femur. Resection is made with an oscillating saw.      The tibia is subluxed forward and the menisci are removed. The extramedullary alignment guide is placed referencing proximally at the medial aspect of the tibial tubercle and distally along the second metatarsal axis and tibial crest. The block is pinned to remove 2mm off the more deficient medial  side. Resection is made with an oscillating saw. Size 3is the most appropriate size for the tibia and the proximal tibia is prepared with the modular drill and keel punch for that size.      The femoral sizing guide is placed and size 3 is most appropriate. Rotation is marked off the epicondylar axis and confirmed by creating a rectangular flexion gap at 90 degrees. The size 3 cutting block is pinned in this rotation and the anterior, posterior and chamfer cuts are made with the oscillating saw. The intercondylar block is then placed and that cut is made.      Trial size 3 tibial component, trial size 3 posterior  stabilized femur and a 12.5  mm posterior stabilized rotating platform insert trial is placed. Full extension is achieved with excellent varus/valgus and anterior/posterior balance throughout full range of motion. The patella is everted and thickness measured to be 22  mm. Free hand resection is taken to 12 mm, a 35 template is placed, lug holes are drilled, trial patella is placed, and it tracks normally.  Osteophytes are removed off the posterior femur with the trial in place. All trials are removed and the cut bone surfaces prepared with pulsatile lavage. Cement is mixed and once ready for implantation, the size 3 tibial implant, size  3 posterior stabilized femoral component, and the size 35 patella are cemented in place and the patella is held with the clamp. The trial insert is placed and the knee held in full extension. The Exparel (20 ml mixed with 30 ml saline) and .25% Bupivicaine, are injected into the extensor mechanism, posterior capsule, medial and lateral gutters and subcutaneous tissues.  All extruded cement is removed and once the cement is hard the permanent 12.5 mm posterior stabilized rotating platform insert is placed into the tibial tray.      The wound is copiously irrigated with saline solution and the extensor mechanism closed over a hemovac drain with #1 PDS suture. The tourniquet is released for a total tourniquet time of 33  minutes. Flexion against gravity is 140 degrees and the patella tracks normally. Subcutaneous tissue is closed with 2.0 vicryl and subcuticular with running 4.0 Monocryl. The incision is cleaned and dried and steri-strips and a bulky sterile dressing are applied. The limb is placed into a knee immobilizer and the patient is awakened and transported to recovery in stable condition.      Please note that a surgical assistant was a medical necessity for this procedure in order to perform it in a safe and expeditious manner. Surgical assistant was necessary to retract the ligaments and vital neurovascular structures to prevent injury to them and also necessary for proper positioning of the limb to allow for anatomic placement of the prosthesis.   Gus Rankin Lindsey Mellone, MD    04/07/2013, 9:17 AM

## 2013-04-07 NOTE — Evaluation (Signed)
Physical Therapy Evaluation Patient Details Name: Lindsey [name] MRN: [id number] DOB: Jan 04, 1956 Today's Date: 04/07/2013 Time: 4098-1191 PT Time Calculation (min): 13 min  PT Assessment / Plan / Recommendation History of Present Illness  S/P R TKA 8/4  Clinical Impression  On eval POD 0, pt required Min assist for mobility-able to ambulate ~50 feet with RW. Anticipate pt will progress well during stay. Recommend HHPT, RW. Husband will assist at home.     PT Assessment  Patient needs continued PT services    Follow Up Recommendations  Home health PT    Does the patient have the potential to tolerate intense rehabilitation      Barriers to Discharge        Equipment Recommendations  Rolling walker with 5" wheels    Recommendations for Other Services OT consult   Frequency 7X/week    Precautions / Restrictions Precautions Precautions: Knee;Fall Restrictions Weight Bearing Restrictions: No RLE Weight Bearing: Weight bearing as tolerated   Pertinent Vitals/Pain 4/10 R knee.       Mobility  Bed Mobility Bed Mobility: Supine to Sit;Sit to Supine Supine to Sit: 4: Min assist Sit to Supine: 4: Min assist Details for Bed Mobility Assistance: assist for R LE Transfers Transfers: Sit to Stand;Stand to Sit Sit to Stand: 4: Min assist Stand to Sit: 4: Min guard Details for Transfer Assistance: Assist to rise, stabilize. VCs safety, technique, hand placement Ambulation/Gait Ambulation/Gait Assistance: 4: Min guard Ambulation Distance (Feet): 50 Feet Assistive device: Rolling walker Ambulation/Gait Assistance Details: VCs safety, technique, sequence. Gait Pattern: Step-to pattern;Decreased stride length;Antalgic;Decreased step length - right    Exercises     PT Diagnosis: Difficulty walking;Abnormality of gait;Acute pain  PT Problem List: Decreased strength;Decreased range of motion;Decreased activity tolerance;Decreased mobility;Decreased knowledge of use of  DME;Decreased knowledge of precautions;Pain PT Treatment Interventions: DME instruction;Gait training;Stair training;Functional mobility training;Therapeutic activities;Therapeutic exercise;Patient/family education     PT Goals(Current goals can be found in the care plan section) Acute Rehab PT Goals Patient Stated Goal: regain independence. home PT Goal Formulation: With patient/family Time For Goal Achievement: 04/14/13 Potential to Achieve Goals: Good  Visit Information  Last PT Received On: 04/07/13 Assistance Needed: +1 History of Present Illness: S/P R TKA 8/4       Prior Functioning  Home Living Family/patient expects to be discharged to:: Private residence Living Arrangements: Spouse/significant other Available Help at Discharge: Family Type of Home: House Home Access: Stairs to enter Secretary/administrator of Steps: 3-garage Entrance Stairs-Rails: Left Home Layout: One level Home Equipment: None Prior Function Level of Independence: Independent Communication Communication: No difficulties    Cognition  Cognition Arousal/Alertness: Awake/alert Behavior During Therapy: WFL for tasks assessed/performed Overall Cognitive Status: Within Functional Limits for tasks assessed    Extremity/Trunk Assessment Upper Extremity Assessment Upper Extremity Assessment: Overall WFL for tasks assessed Lower Extremity Assessment Lower Extremity Assessment: RLE deficits/detail RLE Deficits / Details: hip flex 3/5, moves ankle well Cervical / Trunk Assessment Cervical / Trunk Assessment: Normal   Balance    End of Session PT - End of Session Activity Tolerance: Patient tolerated treatment well Patient left: in bed;with call bell/phone within reach;with family/visitor present CPM Right Knee CPM Right Knee: Off  GP     Rebeca Alert, MPT Pager: [phone]

## 2013-04-07 NOTE — Anesthesia Procedure Notes (Signed)
Spinal  Patient location during procedure: OR Start time: 04/07/2013 8:20 AM Staffing Anesthesiologist: Azell Der Performed by: anesthesiologist  Preanesthetic Checklist Completed: patient identified, site marked, surgical consent, pre-op evaluation, timeout performed, IV checked, risks and benefits discussed and monitors and equipment checked Spinal Block Patient position: sitting Prep: Betadine Patient monitoring: heart rate, continuous pulse ox and blood pressure Approach: midline Location: L3-4 Injection technique: single-shot Needle Needle type: Sprotte  Needle gauge: 24 G Needle length: 9 cm Additional Notes Expiration date of kit checked and confirmed. Patient tolerated procedure well, without complications. Clear CSF. No paresthesias.

## 2013-04-07 NOTE — Anesthesia Postprocedure Evaluation (Signed)
  Anesthesia Post-op Note  Patient: Lindsey [name]  Procedure(s) Performed: Procedure(s) (LRB): RIGHT TOTAL KNEE ARTHROPLASTY (Right)  Patient Location: PACU  Anesthesia Type: Spinal  Level of Consciousness: awake and alert   Airway and Oxygen Therapy: Patient Spontanous Breathing  Post-op Pain: mild  Post-op Assessment: Post-op Vital signs reviewed, Patient's Cardiovascular Status Stable, Respiratory Function Stable, Patent Airway and No signs of Nausea or vomiting  Last Vitals:  Filed Vitals:   04/07/13 1240  BP: 124/77  Pulse: 82  Temp: 36.6 C  Resp: 14    Post-op Vital Signs: stable   Complications: No apparent anesthesia complications. Spinal receding normally.

## 2013-04-07 NOTE — Transfer of Care (Signed)
Immediate Anesthesia Transfer of Care Note  Patient: Lindsey [name]  Procedure(s) Performed: Procedure(s): RIGHT TOTAL KNEE ARTHROPLASTY (Right)  Patient Location: PACU  Anesthesia Type:Spinal  Level of Consciousness: awake, alert , oriented and patient cooperative  Airway & Oxygen Therapy: Patient Spontanous Breathing and Patient connected to face mask oxygen  Post-op Assessment: Report given to PACU RN and Post -op Vital signs reviewed and stable  Post vital signs: Reviewed and stable  Complications: No apparent anesthesia complications

## 2013-04-07 NOTE — H&P (View-Only) (Signed)
Lindsey [name]  DOB: 06/17/1956 Married / Language: English / Race: White Female  Date of Admission:  04/07/2013  Chief Complaint:  Right Knee Pain  History of Present Illness The patient is a 57 year old female who comes in for a preoperative History and Physical. The patient is scheduled for a right total knee arthroplasty to be performed by Dr. Frank V. Aluisio, MD at [facility] on 05/12/2013. The patient is a 57 year old female who presents with knee complaints. The patient was seen for a second opinion. The patient reports right knee symptoms including: pain which began 6 year(s) (or more) ago without any known injury (although she has had multiple falls over the years). Prior to being seen, the patient was previously evaluated by a colleague (at [facility]) 1 year(s) ago. Previous work-up for this problem has included knee x-rays. Past treatment for this problem has included intra-articular injection of corticosteroids (and Supartz). Note for "Knee pain": She had surgery on the knee in 2008 in [place]. She was not able to obtain any records from that. She states the knee is very painful now, which is keeping her from doing the things she needs to do on a daily basis. The patient's knee has gotten progressively worse over time. It is now hurting her at all times. It is limited what she can and cannot do. She is concerned about it giving out. She has had cortisone injections as well as Visco supplement injections with no benefit. She is at a stage now where she needs to do something to get this knee better. She wants to be more active, but the knee is preventing her from doing so. She is ready to proceed with surgery. They have been treated conservatively in the past for the above stated problem and despite conservative measures, they continue to have progressive pain and severe functional limitations and dysfunction. They have failed non-operative management  including home exercise, medications, and injections. It is felt that they would benefit from undergoing total joint replacement. Risks and benefits of the procedure have been discussed with the patient and they elect to proceed with surgery. There are no active contraindications to surgery such as ongoing infection or rapidly progressive neurological disease.   Problem List Primary osteoarthritis of one knee (715.16)   Allergies Sulfanilamide *CHEMICALS*. Hives, Vomiting. Erythromycin *MACROLIDES*. Hives, Nausea. TraMADol HCl *ANALGESICS - OPIOID*. Nausea, Dizziness.   Family History Cerebrovascular Accident. grandfather fathers side Cancer. grandmother mothers side Congestive Heart Failure. grandmother mothers side Chronic Obstructive Lung Disease. grandmother mothers side Diabetes Mellitus. grandfather fathers side Heart Disease. grandmother mothers side, grandfather mothers side, grandmother fathers side and grandfather fathers side Drug / Alcohol Addiction. sister Hypertension. mother and sister Heart disease in female family member before age 65   Social History Illicit drug use. no Exercise. Exercises monthly; does individual sport Marital status. married Living situation. live with spouse Drug/Alcohol Rehab (Currently). no Alcohol use. current drinker; only occasionally per week Current work status. working full time Children. 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 no Number of flights of stairs before winded. 1 Tobacco use. never smoker Previously in rehab. no   Medication History Ambien (10MG Tablet, Oral) Active. Imitrex (100MG Tablet, Oral) Active. (prn) Pravastatin Sodium (40MG Tablet, Oral) Active. MetFORMIN HCl ER (500MG Tablet ER 24HR, Oral) Active. Verapamil HCl ER (240MG Tablet ER, Oral) Active. Lisinopril-Hydrochlorothiazide (20-25MG Tablet, Oral) Active. Omeprazole (20MG  Tablet DR, Oral) Active. Fish Oil (1200MG Capsule, Oral)   Active. Aspirin EC (81MG Tablet DR, Oral) Active.   Past Surgical History Tubal Ligation. Date: 1991. Gallbladder Surgery. Date: 1989. laporoscopic Arthroscopy of Knee. Date: 2008. right Tonsillectomy. Date: 1975. Hysterectomy. Date: 2011. complete (non-cancerous) Enterocele / Rectocele Repair. Date: 2006.   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 Past History   Review of Systems General:Present- Night Sweats. Not Present- Chills, Fever, Fatigue, Weight Gain, Weight Loss and Memory Loss. Skin:Not Present- Hives, Itching, Rash, Eczema and Lesions. HEENT:Present- Tinnitus. Not Present- Headache, Double Vision, Visual Loss, Hearing Loss and Dentures. Respiratory:Not Present- Shortness of breath with exertion, Shortness of breath at rest, Allergies, Coughing up blood and Chronic Cough. Cardiovascular:Not Present- Chest Pain, Racing/skipping heartbeats, Difficulty Breathing Lying Down, Murmur, Swelling and Palpitations. Gastrointestinal:Present- Heartburn. Not Present- Bloody Stool, Abdominal Pain, Vomiting, Nausea, Constipation, Diarrhea, Difficulty Swallowing, Jaundice and Loss of appetitie. Female Genitourinary:Present- Urinating at Night. Not Present- Blood in Urine, Urinary frequency, Weak urinary stream, Discharge, Flank Pain, Incontinence, Painful Urination, Urgency and Urinary Retention. Musculoskeletal:Present- Muscle Pain, Joint Pain, Back Pain, Morning Stiffness and Spasms. Not Present- Muscle Weakness and Joint Swelling. Neurological:Not Present- Tremor, Dizziness, Blackout spells, Paralysis, Difficulty with balance and Weakness. Psychiatric:Not Present- Insomnia.   Vitals Weight: 193  lb Height: 65 in Weight was reported by patient. Height was reported by patient. Body Surface Area: 2 m Body Mass Index: 32.12 kg/m Pulse: 92 (Regular) Resp.: 16 (Unlabored) BP: 134/78 (Sitting, Right Arm, Standard)    Physical Exam The physical exam findings are as follows:  Note: Patient is a 57 year old female with continued right knee pain.   General Mental Status - Alert, cooperative and good historian. General Appearance- pleasant. Not in acute distress. Orientation- Oriented X3. Build & Nutrition- Well nourished and Well developed.   Head and Neck Head- normocephalic, atraumatic . Neck Global Assessment- supple. no bruit auscultated on the right and no bruit auscultated on the left.   Eye Vision- Wears corrective lenses. Pupil- Bilateral- Regular and Round. Motion- Bilateral- EOMI.   Chest and Lung Exam Auscultation: Breath sounds:- clear at anterior chest wall and - clear at posterior chest wall. Adventitious sounds:- No Adventitious sounds.   Cardiovascular Auscultation:Rhythm- Regular rate and rhythm. Heart Sounds- S1 WNL and S2 WNL. Murmurs & Other Heart Sounds:Auscultation of the heart reveals - No Murmurs.   Abdomen Palpation/Percussion:Tenderness- Abdomen is non-tender to palpation. Rigidity (guarding)- Abdomen is soft. Auscultation:Auscultation of the abdomen reveals - Bowel sounds normal.   Female Genitourinary Not done, not pertinent to present illness  Musculoskeletal  On exam she is alert and oriented with no apparent distress. Her hips show normal range of motion with no discomfort. The left knee no effusion. Range about 0 to 125 with no tenderness or instability. Right knee no effusion. Varus deformity noted. Range 5 to 120 with moderate crepitus on range of motion. Tenderness medial greater than lateral with no instability noted.  RADIOGRAPHS: Reviewed AP both knees and lateral showing  significant bone on bone arthritis of the medial and patellofemoral compartments of that right knee.  Assessment & Plan Primary osteoarthritis of one knee (715.16) Impression: Right Knee  Note: Plan is for a Right Total Knee Replacement by Dr. Aluisio.  Plan is to go home following surgery.  PCP - Dr. Kristen Cox and Andrea Johnson PA-C - Patient has been seen preoperatively and felt to be stable for surgery.    The patient does not have any contraindications and will recieve TXA (tranexamic acid) prior to surgery.  Time Spent ~ 20 minutes  Signed electronically by Carisma Troupe L Job Holtsclaw, III PA-C  

## 2013-04-07 NOTE — Interval H&P Note (Signed)
History and Physical Interval Note:  04/07/2013 6:55 AM  Lindsey [name]  has presented today for surgery, with the diagnosis of OSTEOARTHRITIS RIGHT KNEE   The various methods of treatment have been discussed with the patient and family. After consideration of risks, benefits and other options for treatment, the patient has consented to  Procedure(s): RIGHT TOTAL KNEE ARTHROPLASTY (Right) as a surgical intervention .  The patient's history has been reviewed, patient examined, no change in status, stable for surgery.  I have reviewed the patient's chart and labs.  Questions were answered to the patient's satisfaction.     Loanne Drilling

## 2013-04-07 NOTE — Preoperative (Signed)
Beta Blockers   Reason not to administer Beta Blockers:Not Applicable 

## 2013-04-07 NOTE — Anesthesia Preprocedure Evaluation (Addendum)
Anesthesia Evaluation  Patient identified by MRN, date of birth, ID band Patient awake    Reviewed: Allergy & Precautions, H&P , NPO status , Patient's Chart, lab work & pertinent test results  History of Anesthesia Complications (+) MALIGNANT HYPERTHERMIA  Airway Mallampati: II TM Distance: >3 FB Neck ROM: Full    Dental no notable dental hx.    Pulmonary neg pulmonary ROS,  breath sounds clear to auscultation  Pulmonary exam normal       Cardiovascular Exercise Tolerance: Good hypertension, Pt. on medications negative cardio ROS  Rhythm:Regular Rate:Normal     Neuro/Psych  Headaches, negative psych ROS   GI/Hepatic negative GI ROS, Neg liver ROS, GERD-  Medicated,  Endo/Other  diabetes, Type 2, Oral Hypoglycemic Agents  Renal/GU negative Renal ROS  negative genitourinary   Musculoskeletal negative musculoskeletal ROS (+)   Abdominal (+) + obese,   Peds negative pediatric ROS (+)  Hematology negative hematology ROS (+)   Anesthesia Other Findings   Reproductive/Obstetrics negative OB ROS                          Anesthesia Physical Anesthesia Plan  ASA: III  Anesthesia Plan: General   Post-op Pain Management:    Induction: Intravenous  Airway Management Planned:   Additional Equipment:   Intra-op Plan:   Post-operative Plan: Extubation in OR  Informed Consent: I have reviewed the patients History and Physical, chart, labs and discussed the procedure including the risks, benefits and alternatives for the proposed anesthesia with the patient or authorized representative who has indicated his/her understanding and acceptance.   Dental advisory given  Plan Discussed with: CRNA  Anesthesia Plan Comments: (Discussed general versus spinal. Discussed risks/benefits of spinal including headache, backache, failure, bleeding, infection, and nerve damage. Patient consents to spinal.  Questions answered. Coagulation studies and platelet count acceptable.)        Anesthesia Quick Evaluation

## 2013-04-08 ENCOUNTER — Encounter (HOSPITAL_COMMUNITY): Payer: Self-pay | Admitting: Orthopedic Surgery

## 2013-04-08 DIAGNOSIS — I10 Essential (primary) hypertension: Secondary | ICD-10-CM | POA: Diagnosis present

## 2013-04-08 DIAGNOSIS — K219 Gastro-esophageal reflux disease without esophagitis: Secondary | ICD-10-CM | POA: Diagnosis present

## 2013-04-08 DIAGNOSIS — I152 Hypertension secondary to endocrine disorders: Secondary | ICD-10-CM | POA: Diagnosis present

## 2013-04-08 DIAGNOSIS — D62 Acute posthemorrhagic anemia: Secondary | ICD-10-CM | POA: Diagnosis not present

## 2013-04-08 DIAGNOSIS — E119 Type 2 diabetes mellitus without complications: Secondary | ICD-10-CM | POA: Diagnosis present

## 2013-04-08 LAB — CBC
HCT: 32.5 % — ABNORMAL LOW (ref 36.0–46.0)
Hemoglobin: 10.8 g/dL — ABNORMAL LOW (ref 12.0–15.0)
MCH: 27.6 pg (ref 26.0–34.0)
MCHC: 33.2 g/dL (ref 30.0–36.0)
MCV: 82.9 fL (ref 78.0–100.0)
Platelets: 360 10*3/uL (ref 150–400)
RBC: 3.92 MIL/uL (ref 3.87–5.11)
RDW: 13.7 % (ref 11.5–15.5)
WBC: 14.6 10*3/uL — ABNORMAL HIGH (ref 4.0–10.5)

## 2013-04-08 LAB — BASIC METABOLIC PANEL
BUN: 14 mg/dL (ref 6–23)
CO2: 26 mEq/L (ref 19–32)
Calcium: 8.8 mg/dL (ref 8.4–10.5)
Chloride: 100 mEq/L (ref 96–112)
Creatinine, Ser: 0.77 mg/dL (ref 0.50–1.10)
GFR calc Af Amer: >90 mL/min (ref >90)
GFR calc non Af Amer: >90 mL/min (ref >90)
Glucose, Bld: 156 mg/dL — ABNORMAL HIGH (ref 70–99)
Potassium: 4.1 mEq/L (ref 3.5–5.1)
Sodium: 135 mEq/L (ref 135–145)

## 2013-04-08 LAB — GLUCOSE, CAPILLARY
Glucose-Capillary: 139 mg/dL — ABNORMAL HIGH (ref 70–99)
Glucose-Capillary: 132 mg/dL — ABNORMAL HIGH (ref 70–99)
Glucose-Capillary: 169 mg/dL — ABNORMAL HIGH (ref 70–99)
Glucose-Capillary: 205 mg/dL — ABNORMAL HIGH (ref 70–99)

## 2013-04-08 MED ORDER — OMEPRAZOLE 20 MG PO CPDR
20.0000 mg | DELAYED_RELEASE_CAPSULE | Freq: Every day | ORAL | Status: DC
  Administered 2013-04-08 – 2013-04-09 (×2): 20 mg via ORAL
  Filled 2013-04-08 (×2): qty 1

## 2013-04-08 MED ORDER — NON FORMULARY: 20.0000 mg | Freq: Every day | Status: DC

## 2013-04-08 NOTE — Progress Notes (Signed)
Utilization review completed.  

## 2013-04-08 NOTE — Progress Notes (Signed)
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  Pt OOB in recliner.  Amb in hallway, assisted to BR then performed some TE's due to increased pain level.  Applied ICE.   Follow Up Recommendations  Home health PT     Does the patient have the potential to tolerate intense rehabilitation     Barriers to Discharge        Equipment Recommendations  Rolling walker with 5" wheels    Recommendations for Other Services    Frequency 7X/week   Progress towards PT Goals Progress towards PT goals: Progressing toward goals  Plan      Precautions / Restrictions Precautions Precautions: Knee Precaution Comments: Pt able to perform SLR so no KI needed Restrictions Weight Bearing Restrictions: No RLE Weight Bearing: Weight bearing as tolerated    Pertinent Vitals/Pain C/o 7/10 during knee flex AAROM Pre medicated ICE applied TE's ceased     Mobility  Bed Mobility Bed Mobility: Not assessed Supine to Sit: 5: Supervision;HOB elevated Details for Bed Mobility Assistance: Pt OOB inrecliner Transfers Transfers: Sit to Stand;Stand to Sit Sit to Stand: 5: Supervision;With upper extremity assist;From chair/3-in-1;4: Min guard;From toilet Stand to Sit: 5: Supervision;4: Min guard;To toilet;To chair/3-in-1 Details for Transfer Assistance: increased time and <25% VC's on safety with turns and R LE extension Ambulation/Gait Ambulation/Gait Assistance: 4: Min guard Ambulation Distance (Feet): 84 Feet Assistive device: Rolling walker Ambulation/Gait Assistance Details: <25% VC's on safetywith turns and increased time Gait Pattern: Step-to pattern;Decreased stride length;Antalgic;Decreased step length - right    Exercises   Total Knee Replacement  TE's 10 reps B LE ankle pumps 10 reps knee presses 10 reps heel slides  10 reps SAQ's 10 reps SLR's 10 reps ABD Followed by ICE    PT Goals (current goals can now be found in the care plan section) Acute Rehab PT Goals Patient Stated Goal: regain independence. home  Visit Information  Last PT Received On: 04/08/13 Assistance Needed: +1 History of Present Illness: S/P R TKA 8/4    Subjective Data  Patient Stated Goal: regain independence. home   Cognition  Cognition Arousal/Alertness: Awake/alert Behavior During Therapy: WFL for tasks assessed/performed Overall Cognitive Status: Within Functional Limits for tasks assessed    Balance  Balance Balance Assessed: Yes Dynamic Standing Balance Dynamic Standing - Level of Assistance: 5: Stand by assistance  End of Session PT - End of Session Equipment Utilized During Treatment: Gait belt Activity Tolerance: Patient tolerated treatment well Patient left: in chair;with call bell/phone within reach;with family/visitor present   Felecia Shelling  PTA [facility]  Acute  Rehab Pager      [phone]

## 2013-04-08 NOTE — Progress Notes (Signed)
   Subjective: 1 Day Post-Op Procedure(s) (LRB): RIGHT TOTAL KNEE ARTHROPLASTY (Right) Patient reports pain as mild.   Patient seen in rounds with Dr. Lequita Halt. Husband in room. Patient is well, but has had some minor complaints of pain in the knee, requiring pain medications We will start therapy today.  Plan is to go Home after hospital stay.  Objective: Vital signs in last 24 hours: Temp:  [97.6 F (36.4 C)-98.6 F (37 C)] 97.9 F (36.6 C) (08/05 0920) Pulse Rate:  [57-87] 80 (08/05 0920) Resp:  [9-18] 18 (08/05 0920) BP: (102-127)/(45-78) 123/77 mmHg (08/05 0920) SpO2:  [93 %-100 %] 97 % (08/05 0920) Weight:  [88.905 kg (196 lb)] 88.905 kg (196 lb) (08/04 1145)  Intake/Output from previous day:  Intake/Output Summary (Last 24 hours) at 04/08/13 0951 Last data filed at 04/08/13 0555  Gross per 24 hour  Intake   1390 ml  Output   2490 ml  Net  -1100 ml    Intake/Output this shift:    Labs:  Recent Labs  04/08/13 0443  HGB 10.8*    Recent Labs  04/08/13 0443  WBC 14.6*  RBC 3.92  HCT 32.5*  PLT 360    Recent Labs  04/08/13 0443  NA 135  K 4.1  CL 100  CO2 26  BUN 14  CREATININE 0.77  GLUCOSE 156*  CALCIUM 8.8   No results found for this basename: LABPT, INR,  in the last 72 hours  EXAM General - Patient is Alert, Appropriate and Oriented Extremity - Neurovascular intact Sensation intact distally Dorsiflexion/Plantar flexion intact Dressing - dressing C/D/I Motor Function - intact, moving foot and toes well on exam.  Hemovac pulled without difficulty.  Past Medical History  Diagnosis Date  . Hypertension   . Diabetes mellitus without complication   . Frequent UTI      h/o since hysterectomy/04/01/13 asymptomatic  . GERD (gastroesophageal reflux disease)   . Headache(784.0)     migraines  . 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 Arthritis     Assessment/Plan: 1 Day Post-Op Procedure(s) (LRB): RIGHT TOTAL KNEE ARTHROPLASTY  (Right) Principal Problem:   OA (osteoarthritis) of knee Active Problems:   Postoperative anemia due to acute blood loss   Unspecified essential hypertension - resumed home medication   Diabetes - Glucophage is held temporarily, sliding scale insulin   GERD (gastroesophageal reflux disease) - resumed home medication  Estimated body mass index is 32.62 kg/(m^2) as calculated from the following:   Height as of this encounter: 5\' 5"  (1.651 m).   Weight as of this encounter: 88.905 kg (196 lb). Advance diet Up with therapy Plan for discharge tomorrow Discharge home with home health  DVT Prophylaxis - Xarelto Weight-Bearing as tolerated to right leg No vaccines.  Lindsey [name] 04/08/2013, 9:51 AM

## 2013-04-08 NOTE — Progress Notes (Signed)
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  Pt c/o increased "soreness"  "from the exercises" stated pt.  Assisted OOB to amb in hallway second time then to BR to vois then back to bed.   Follow Up Recommendations  Home health PT     Does the patient have the potential to tolerate intense rehabilitation     Barriers to Discharge        Equipment Recommendations  Rolling walker with 5" wheels    Recommendations for Other Services    Frequency 7X/week   Progress towards PT Goals Progress towards PT goals: Progressing toward goals  Plan      Precautions / Restrictions Precautions Precautions: Knee Precaution Comments: Pt able to perform SLR so no KI needed Restrictions Weight Bearing Restrictions: No RLE Weight Bearing: Weight bearing as tolerated    Pertinent Vitals/Pain C/o "soreness' ICE applied    Mobility  Bed Mobility Bed Mobility: Supine to Sit;Sit to Supine Supine to Sit: 4: Min guard Sit to Supine: 4: Min guard Details for Bed Mobility Assistance: MinGuard assist for R LE in/OOB with increased time Transfers Transfers: Sit to Stand;Stand to Sit Sit to Stand: 5: Supervision;With upper extremity assist;4: Min guard;From toilet;From bed Stand to Sit: 5: Supervision;4: Min guard;To toilet;To bed Details for Transfer Assistance: increased time and <25% VC's on safety with turns and R LE extension Ambulation/Gait Ambulation/Gait Assistance: 4: Min guard;5: Supervision Ambulation Distance (Feet): 95 Feet Assistive device: Rolling walker Ambulation/Gait Assistance Details: increased time with MOD c/o "soreness" Gait Pattern: Step-to pattern;Decreased stride length;Antalgic;Decreased step length - right    PT Goals  (current goals can now be found in the care plan section)    Visit Information  Last PT Received On: 04/08/13 Assistance Needed: +1 History of Present Illness: 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

## 2013-04-08 NOTE — Evaluation (Signed)
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 All education completed and no further OT needs.    OT Assessment  Patient does not need any further OT services    Follow Up Recommendations  No OT follow up;Supervision - Intermittent    Barriers to Discharge      Equipment Recommendations  3 in 1 bedside comode    Recommendations for Other Services    Frequency       Precautions / Restrictions Precautions Precautions: Knee Restrictions Weight Bearing Restrictions: No RLE Weight Bearing: Weight bearing as tolerated   Pertinent Vitals/Pain 3/10 R knee. Reposition; ice    ADL  Eating/Feeding: Independent Where Assessed - Eating/Feeding: Chair Grooming: Wash/dry hands;Supervision/safety Where Assessed - Grooming: Unsupported standing Upper Body Bathing: Chest;Right arm;Left arm;Abdomen;Set up Where Assessed - Upper Body Bathing: Unsupported sitting Lower Body Bathing: Supervision/safety Where Assessed - Lower Body Bathing: Supported sit to stand Upper Body Dressing: Set up Where Assessed - Upper Body Dressing: Unsupported sitting Lower Body Dressing: Supervision/safety Where Assessed - Lower Body Dressing: Supported sit to stand Toilet Transfer: Banker: Raised toilet seat with arms (or 3-in-1 over toilet) Toileting - Clothing Manipulation and Hygiene: Supervision/safety Where Assessed - Engineer, mining and Hygiene: Sit to stand from 3-in-1 or toilet Tub/Shower Transfer: Min guard Equipment Used: Rolling walker ADL Comments: Pt doing very well with all tasks and husband can assist PRN at d/c. Pt agreeable to 3in1 needed for safety. Pt has a built in  shower seat. Discussed placement of 3in1 versus built in seat and if husband needs to help boost up from seat (corner seat).    OT Diagnosis:    OT Problem List:   OT Treatment Interventions:     OT Goals(Current goals can be found in the care plan section) Acute Rehab OT Goals Patient Stated Goal: regain independence. 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       Prior Functioning     Home Living Family/patient expects to be discharged to:: Private residence Living Arrangements: Spouse/significant other Available Help at Discharge: Family Type of Home: House Home Access: Stairs to enter Secretary/administrator of Steps: 3-garage Entrance Stairs-Rails: Left Home Layout: One level Home Equipment: None Prior Function Level of Independence: Independent Communication Communication: No difficulties         Vision/Perception     Cognition  Cognition Arousal/Alertness: Awake/alert Behavior During Therapy: WFL for tasks assessed/performed Overall Cognitive Status: Within Functional Limits for tasks assessed    Extremity/Trunk Assessment Upper Extremity Assessment Upper Extremity Assessment: Overall WFL for tasks assessed     Mobility Bed Mobility Bed Mobility: Supine to Sit Supine to Sit: 5: Supervision;HOB elevated Transfers Transfers: Sit to Stand;Stand to Sit Sit to Stand: 5: Supervision;With upper extremity assist;From bed;From chair/3-in-1 Stand to Sit: 5: Supervision;With upper extremity assist;To chair/3-in-1 Details for Transfer Assistance: verbal cues for hand placement and LE management     Exercise     Balance Balance Balance Assessed: Yes Dynamic Standing Balance Dynamic Standing - Level of Assistance: 5: Stand by assistance   End of Session OT - End of Session Activity Tolerance: Patient tolerated treatment well Patient  left: in chair;with call bell/phone within  reach  GO     Lennox Laity [phone] 04/08/2013, 10:06 AM

## 2013-04-09 DIAGNOSIS — E871 Hypo-osmolality and hyponatremia: Secondary | ICD-10-CM | POA: Diagnosis not present

## 2013-04-09 LAB — CBC
HCT: 33.5 % — ABNORMAL LOW (ref 36.0–46.0)
Hemoglobin: 11 g/dL — ABNORMAL LOW (ref 12.0–15.0)
MCH: 27.5 pg (ref 26.0–34.0)
MCHC: 32.8 g/dL (ref 30.0–36.0)
MCV: 83.8 fL (ref 78.0–100.0)
Platelets: 386 10*3/uL (ref 150–400)
RBC: 4 MIL/uL (ref 3.87–5.11)
RDW: 13.9 % (ref 11.5–15.5)
WBC: 14.3 10*3/uL — ABNORMAL HIGH (ref 4.0–10.5)

## 2013-04-09 LAB — BASIC METABOLIC PANEL
BUN: 12 mg/dL (ref 6–23)
CO2: 27 mEq/L (ref 19–32)
Calcium: 9.2 mg/dL (ref 8.4–10.5)
Chloride: 97 mEq/L (ref 96–112)
Creatinine, Ser: 0.69 mg/dL (ref 0.50–1.10)
GFR calc Af Amer: >90 mL/min (ref >90)
GFR calc non Af Amer: >90 mL/min (ref >90)
Glucose, Bld: 183 mg/dL — ABNORMAL HIGH (ref 70–99)
Potassium: 4.3 mEq/L (ref 3.5–5.1)
Sodium: 132 mEq/L — ABNORMAL LOW (ref 135–145)

## 2013-04-09 LAB — GLUCOSE, CAPILLARY
Glucose-Capillary: 162 mg/dL — ABNORMAL HIGH (ref 70–99)
Glucose-Capillary: 165 mg/dL — ABNORMAL HIGH (ref 70–99)

## 2013-04-09 MED ORDER — TRAMADOL HCL 50 MG PO TABS: 50.0000 mg | ORAL_TABLET | Freq: Four times a day (QID) | ORAL | Status: DC | PRN

## 2013-04-09 MED ORDER — METHOCARBAMOL 500 MG PO TABS: 500.0000 mg | ORAL_TABLET | Freq: Four times a day (QID) | ORAL | Status: DC | PRN

## 2013-04-09 MED ORDER — RIVAROXABAN 10 MG PO TABS: 10.0000 mg | ORAL_TABLET | Freq: Every day | ORAL | Status: DC

## 2013-04-09 MED ORDER — OXYCODONE HCL 5 MG PO TABS: 5.0000 mg | ORAL_TABLET | ORAL | Status: DC | PRN

## 2013-04-09 NOTE — Progress Notes (Signed)
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  Practiced staeps with spouse and amb in hallway.  Performed TKR TE's then applied ICE.    Follow Up Recommendations  Home health PT     Does the patient have the potential to tolerate intense rehabilitation     Barriers to Discharge        Equipment Recommendations  Rolling walker with 5" wheels    Recommendations for Other Services    Frequency 7X/week   Progress towards PT Goals Progress towards PT goals: Progressing toward goals  Plan      Precautions / Restrictions Precautions Precautions: Knee Precaution Comments: Pt able to perform SLR so no KI needed Restrictions Weight Bearing Restrictions: No RLE Weight Bearing: Weight bearing as tolerated    Pertinent Vitals/Pain C/o 6/10 with TE's Applied ICE    Mobility  Bed Mobility Bed Mobility: Not assessed Details for Bed Mobility Assistance: Pt OOB in recliner Transfers Transfers: Sit to Stand;Stand to Sit Sit to Stand: 5: Supervision;From chair/3-in-1 Stand to Sit: 5: Supervision;To chair/3-in-1 Details for Transfer Assistance: increased time and <25% VC's on safety with turns and R LE extension Ambulation/Gait Ambulation/Gait Assistance: 5: Supervision Ambulation Distance (Feet): 84 Feet Assistive device: Rolling walker Ambulation/Gait Assistance Details: increased time and one VC on safety with backward gait Gait Pattern: Step-to pattern;Decreased stride length;Antalgic;Decreased step length - right Gait velocity: decreased Stairs: Yes Stairs Assistance: 4: Min assist Stairs Assistance Details (indicate cue type and reason): performed with spouse Stair Management Technique: One rail  Left;Forwards Number of Stairs: 4     PT Goals (current goals can now be found in the care plan section)    Visit Information  Last PT Received On: 04/09/13 Assistance Needed: +1 History of Present Illness: 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

## 2013-04-09 NOTE — Progress Notes (Signed)
   Subjective: 2 Days Post-Op Procedure(s) (LRB): RIGHT TOTAL KNEE ARTHROPLASTY (Right) Patient reports pain as mild.   Patient seen in rounds with Dr. Lequita Halt. Patient is well, and has had no acute complaints or problems Patient is ready to go home  Objective: Vital signs in last 24 hours: Temp:  [97.6 F (36.4 C)-98.8 F (37.1 C)] 97.7 F (36.5 C) (08/06 0551) Pulse Rate:  [69-80] 69 (08/06 0551) Resp:  [14-18] 16 (08/06 0551) BP: (123-144)/(77-87) 144/87 mmHg (08/06 0551) SpO2:  [94 %-97 %] 94 % (08/06 0551)  Intake/Output from previous day:  Intake/Output Summary (Last 24 hours) at 04/09/13 0758 Last data filed at 04/09/13 4540  Gross per 24 hour  Intake   1200 ml  Output   1500 ml  Net   -300 ml    Intake/Output this shift:    Labs:  Recent Labs  04/08/13 0443 04/09/13 0435  HGB 10.8* 11.0*    Recent Labs  04/08/13 0443 04/09/13 0435  WBC 14.6* 14.3*  RBC 3.92 4.00  HCT 32.5* 33.5*  PLT 360 386    Recent Labs  04/08/13 0443 04/09/13 0435  NA 135 132*  K 4.1 4.3  CL 100 97  CO2 26 27  BUN 14 12  CREATININE 0.77 0.69  GLUCOSE 156* 183*  CALCIUM 8.8 9.2   No results found for this basename: LABPT, INR,  in the last 72 hours  EXAM: General - Patient is Alert, Appropriate and Oriented Extremity - Neurovascular intact Sensation intact distally Dorsiflexion/Plantar flexion intact No cellulitis present Incision - clean, dry, no drainage, healing Motor Function - intact, moving foot and toes well on exam.   Assessment/Plan: 2 Days Post-Op Procedure(s) (LRB): RIGHT TOTAL KNEE ARTHROPLASTY (Right) Procedure(s) (LRB): RIGHT TOTAL KNEE ARTHROPLASTY (Right) Past Medical History  Diagnosis Date  . Hypertension   . Diabetes mellitus without complication   . Frequent UTI      h/o since hysterectomy/04/01/13 asymptomatic  . GERD (gastroesophageal reflux disease)   . Headache(784.0)     migraines  . 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  Arthritis    Principal Problem:   OA (osteoarthritis) of knee Active Problems:   Postoperative anemia due to acute blood loss   Unspecified essential hypertension   Diabetes   GERD (gastroesophageal reflux disease)   Hyponatremia  Estimated body mass index is 32.62 kg/(m^2) as calculated from the following:   Height as of this encounter: 5\' 5"  (1.651 m).   Weight as of this encounter: 88.905 kg (196 lb). Up with therapy Discharge home with home health Diet - Cardiac diet and Diabetic diet Follow up - in 2 weeks Activity - WBAT Disposition - Home Condition Upon Discharge - Good D/C Meds - See DC Summary DVT Prophylaxis - Xarelto  Philemon Riedesel 04/09/2013, 7:58 AM

## 2013-04-09 NOTE — Care Management Note (Signed)
    Page 1 of 2   04/09/2013     11:14:33 AM   CARE MANAGEMENT NOTE 04/09/2013  Patient:  Lindsey [name], Lindsey [name]   Account Number:  [id number]  Date Initiated:  04/08/2013  Documentation initiated by:  Colleen Can  Subjective/Objective Assessment:   dx total rt knee replacemnt    PRE-ARRANGED WITH [facility] FOR hh SERVICES. hhpt WILL START 04/10/2013     Action/Plan:   CM spoke with patient and spouse. Plans are for her to return to [facility] where spouse will be caqregiver. She will need RW and 3n1.   Anticipated DC Date:  04/09/2013   Anticipated DC Plan:  HOME W HOME HEALTH SERVICES      DC Planning Services  CM consult      PAC Choice  DURABLE MEDICAL EQUIPMENT  HOME HEALTH   Choice offered to / List presented to:  C-1 Patient   DME arranged  3-N-1  Levan Hurst      DME agency  [facility]     HH arranged  HH-2 PT      [facility] agency  [facility]   Status of service:  Completed, signed off Medicare Important Message given?   (If response is "NO", the following Medicare IM given date fields will be blank) Date Medicare IM given:   Date Additional Medicare IM given:    Discharge Disposition:  HOME W HOME HEALTH SERVICES  Per UR Regulation:  Reviewed for med. necessity/level of care/duration of stay  If discussed at Long Length of Stay Meetings, dates discussed:    Comments:

## 2013-04-09 NOTE — Discharge Summary (Signed)
Physician Discharge Summary   Patient ID: Lindsey [name] MRN: [id number] DOB/AGE: 01/22/1956 57 y.o.  Admit date: 04/07/2013 Discharge date: 04/09/2013  Primary Diagnosis:  Osteoarthritis Right knee  Admission Diagnoses:  Past Medical History  Diagnosis Date  . Hypertension   . Diabetes mellitus without complication   . Frequent UTI      h/o since hysterectomy/04/01/13 asymptomatic  . GERD (gastroesophageal reflux disease)   . Headache(784.0)     migraines  . 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 Arthritis    Discharge Diagnoses:   Principal Problem:   OA (osteoarthritis) of knee Active Problems:   Postoperative anemia due to acute blood loss   Unspecified essential hypertension   Diabetes   GERD (gastroesophageal reflux disease)   Hyponatremia  Estimated body mass index is 32.62 kg/(m^2) as calculated from the following:   Height as of this encounter: 5\' 5"  (1.651 m).   Weight as of this encounter: 88.905 kg (196 lb).  Procedure:  Procedure(s) (LRB): RIGHT TOTAL KNEE ARTHROPLASTY (Right)   Consults: None  HPI: Lindsey [name] is a 57 y.o. year old female with end stage OA of her right knee with progressively worsening pain and dysfunction. She has constant pain, with activity and at rest and significant functional deficits with difficulties even with ADLs. She has had extensive non-op management including analgesics, injections of cortisone and viscosupplements, and home exercise program, but remains in significant pain with significant dysfunction.Radiographs show bone on bone arthritis medial and patellofemoral. She presents now for right Total Knee Arthroplasty.   Laboratory Data: Admission on 04/07/2013, Discharged on 04/09/2013  Component Date Value Range Status  . ABO/RH(D) 04/07/2013 O NEG   Final  . Antibody Screen 04/07/2013 NEG   Final  . Sample Expiration 04/07/2013 04/10/2013   Final  . Glucose-Capillary 04/07/2013 125* 70 - 99 mg/dL Final  . Comment 1  62/13/0865 Documented in Chart   Final  . ABO/RH(D) 04/07/2013 O NEG   Final  . Glucose-Capillary 04/07/2013 114* 70 - 99 mg/dL Final  . Comment 1 78/46/9629 Documented in Chart   Final  . Comment 2 04/07/2013 Notify RN   Final  . Glucose-Capillary 04/07/2013 115* 70 - 99 mg/dL Final  . Glucose-Capillary 04/07/2013 157* 70 - 99 mg/dL Final  . WBC 52/84/1324 14.6* 4.0 - 10.5 K/uL Final  . RBC 04/08/2013 3.92  3.87 - 5.11 MIL/uL Final  . Hemoglobin 04/08/2013 10.8* 12.0 - 15.0 g/dL Final  . HCT 40/06/2724 32.5* 36.0 - 46.0 % Final  . MCV 04/08/2013 82.9  78.0 - 100.0 fL Final  . MCH 04/08/2013 27.6  26.0 - 34.0 pg Final  . MCHC 04/08/2013 33.2  30.0 - 36.0 g/dL Final  . RDW 36/64/4034 13.7  11.5 - 15.5 % Final  . Platelets 04/08/2013 360  150 - 400 K/uL Final  . Sodium 04/08/2013 135  135 - 145 mEq/L Final  . Potassium 04/08/2013 4.1  3.5 - 5.1 mEq/L Final  . Chloride 04/08/2013 100  96 - 112 mEq/L Final  . CO2 04/08/2013 26  19 - 32 mEq/L Final  . Glucose, Bld 04/08/2013 156* 70 - 99 mg/dL Final  . BUN 74/25/9563 14  6 - 23 mg/dL Final  . Creatinine, Ser 04/08/2013 0.77  0.50 - 1.10 mg/dL Final  . Calcium 87/56/4332 8.8  8.4 - 10.5 mg/dL Final  . GFR calc non Af Amer 04/08/2013 >90  >90 mL/min Final  . GFR calc Af Amer 04/08/2013 >90  >  90 mL/min Final   Comment:                                 The eGFR has been calculated                          using the CKD EPI equation.                          This calculation has not been                          validated in all clinical                          situations.                          eGFR's persistently                          <90 mL/min signify                          possible Chronic Kidney Disease.  . Glucose-Capillary 04/07/2013 195* 70 - 99 mg/dL Final  . Glucose-Capillary 04/08/2013 139* 70 - 99 mg/dL Final  . Comment 1 16/06/9603 Notify RN   Final  . Comment 2 04/08/2013 Documented in Chart   Final  .  Glucose-Capillary 04/08/2013 132* 70 - 99 mg/dL Final  . Comment 1 54/05/8118 Documented in Chart   Final  . Comment 2 04/08/2013 Notify RN   Final  . WBC 04/09/2013 14.3* 4.0 - 10.5 K/uL Final  . RBC 04/09/2013 4.00  3.87 - 5.11 MIL/uL Final  . Hemoglobin 04/09/2013 11.0* 12.0 - 15.0 g/dL Final  . HCT 14/78/2956 33.5* 36.0 - 46.0 % Final  . MCV 04/09/2013 83.8  78.0 - 100.0 fL Final  . MCH 04/09/2013 27.5  26.0 - 34.0 pg Final  . MCHC 04/09/2013 32.8  30.0 - 36.0 g/dL Final  . RDW 21/30/8657 13.9  11.5 - 15.5 % Final  . Platelets 04/09/2013 386  150 - 400 K/uL Final  . Sodium 04/09/2013 132* 135 - 145 mEq/L Final  . Potassium 04/09/2013 4.3  3.5 - 5.1 mEq/L Final  . Chloride 04/09/2013 97  96 - 112 mEq/L Final  . CO2 04/09/2013 27  19 - 32 mEq/L Final  . Glucose, Bld 04/09/2013 183* 70 - 99 mg/dL Final  . BUN 84/69/6295 12  6 - 23 mg/dL Final  . Creatinine, Ser 04/09/2013 0.69  0.50 - 1.10 mg/dL Final  . Calcium 28/41/3244 9.2  8.4 - 10.5 mg/dL Final  . GFR calc non Af Amer 04/09/2013 >90  >90 mL/min Final  . GFR calc Af Amer 04/09/2013 >90  >90 mL/min Final   Comment:                                 The eGFR has been calculated                          using the CKD EPI equation.  This calculation has not been                          validated in all clinical                          situations.                          eGFR's persistently                          <90 mL/min signify                          possible Chronic Kidney Disease.  . Glucose-Capillary 04/08/2013 169* 70 - 99 mg/dL Final  . Comment 1 16/06/9603 Notify RN   Final  . Comment 2 04/08/2013 Documented in Chart   Final  . Glucose-Capillary 04/08/2013 205* 70 - 99 mg/dL Final  . Comment 1 54/05/8118 Notify RN   Final  . Glucose-Capillary 04/09/2013 162* 70 - 99 mg/dL Final  . Comment 1 14/78/2956 Notify RN   Final  . Comment 2 04/09/2013 Documented in Chart   Final  .  Glucose-Capillary 04/09/2013 165* 70 - 99 mg/dL Final  . Comment 1 21/30/8657 Notify RN   Final  . Comment 2 04/09/2013 Documented in Chart   Final  Hospital Outpatient Visit on 04/01/2013  Component Date Value Range Status  . aPTT 04/01/2013 31  24 - 37 seconds Final  . WBC 04/01/2013 9.7  4.0 - 10.5 K/uL Final  . RBC 04/01/2013 4.92  3.87 - 5.11 MIL/uL Final  . Hemoglobin 04/01/2013 13.6  12.0 - 15.0 g/dL Final  . HCT 84/69/6295 41.0  36.0 - 46.0 % Final  . MCV 04/01/2013 83.3  78.0 - 100.0 fL Final  . MCH 04/01/2013 27.6  26.0 - 34.0 pg Final  . MCHC 04/01/2013 33.2  30.0 - 36.0 g/dL Final  . RDW 28/41/3244 13.6  11.5 - 15.5 % Final  . Platelets 04/01/2013 436* 150 - 400 K/uL Final  . Sodium 04/01/2013 136  135 - 145 mEq/L Final  . Potassium 04/01/2013 3.9  3.5 - 5.1 mEq/L Final  . Chloride 04/01/2013 98  96 - 112 mEq/L Final  . CO2 04/01/2013 27  19 - 32 mEq/L Final  . Glucose, Bld 04/01/2013 120* 70 - 99 mg/dL Final  . BUN 09/06/7251 14  6 - 23 mg/dL Final  . Creatinine, Ser 04/01/2013 0.73  0.50 - 1.10 mg/dL Final  . Calcium 66/44/0347 10.2  8.4 - 10.5 mg/dL Final  . Total Protein 04/01/2013 7.5  6.0 - 8.3 g/dL Final  . Albumin 42/59/5638 4.0  3.5 - 5.2 g/dL Final  . AST 75/64/3329 20  0 - 37 U/L Final  . ALT 04/01/2013 20  0 - 35 U/L Final  . Alkaline Phosphatase 04/01/2013 94  39 - 117 U/L Final  . Total Bilirubin 04/01/2013 0.3  0.3 - 1.2 mg/dL Final  . GFR calc non Af Amer 04/01/2013 >90  >90 mL/min Final  . GFR calc Af Amer 04/01/2013 >90  >90 mL/min Final   Comment:                                 The eGFR has  been calculated                          using the CKD EPI equation.                          This calculation has not been                          validated in all clinical                          situations.                          eGFR's persistently                          <90 mL/min signify                          possible Chronic Kidney Disease.  Marland Kitchen  Prothrombin Time 04/01/2013 12.2  11.6 - 15.2 seconds Final  . INR 04/01/2013 0.92  0.00 - 1.49 Final  . Color, Urine 04/01/2013 YELLOW  YELLOW Final  . APPearance 04/01/2013 CLEAR  CLEAR Final  . Specific Gravity, Urine 04/01/2013 1.009  1.005 - 1.030 Final  . pH 04/01/2013 6.0  5.0 - 8.0 Final  . Glucose, UA 04/01/2013 NEGATIVE  NEGATIVE mg/dL Final  . Hgb urine dipstick 04/01/2013 NEGATIVE  NEGATIVE Final  . Bilirubin Urine 04/01/2013 NEGATIVE  NEGATIVE Final  . Ketones, ur 04/01/2013 NEGATIVE  NEGATIVE mg/dL Final  . Protein, ur 78/29/5621 NEGATIVE  NEGATIVE mg/dL Final  . Urobilinogen, UA 04/01/2013 0.2  0.0 - 1.0 mg/dL Final  . Nitrite 30/86/5784 NEGATIVE  NEGATIVE Final  . Leukocytes, UA 04/01/2013 NEGATIVE  NEGATIVE Final   MICROSCOPIC NOT DONE ON URINES WITH NEGATIVE PROTEIN, BLOOD, LEUKOCYTES, NITRITE, OR GLUCOSE <1000 mg/dL.  Marland Kitchen MRSA, PCR 04/01/2013 NEGATIVE  NEGATIVE Final  . Staphylococcus aureus 04/01/2013 NEGATIVE  NEGATIVE Final   Comment:                                 The Xpert SA Assay (FDA                          approved for NASAL specimens                          in patients over 68 years of age),                          is one component of                          a comprehensive surveillance                          program.  Test performance has                          been validated by [employer]  [facility] for patients greater                          than or equal to 14 year old.                          It is not intended                          to diagnose infection nor to                          guide or monitor treatment.     X-Rays:No results found.  EKG: Orders placed during the hospital encounter of 04/01/13  . EKG 12-LEAD  . EKG 12-LEAD     Hospital Course: Lindsey [name] is a 57 y.o. who was admitted to [facility]. They were brought to the operating room on 04/07/2013 and underwent Procedure(s): RIGHT TOTAL  KNEE ARTHROPLASTY.  Patient tolerated the procedure well and was later transferred to the recovery room and then to the orthopaedic floor for postoperative care.  They were given PO and IV analgesics for pain control following their surgery.  They were given 24 hours of postoperative antibiotics of  Anti-infectives   Start     Dose/Rate Route Frequency Ordered Stop   04/07/13 1400  ceFAZolin (ANCEF) IVPB 1 g/50 mL premix     1 g 100 mL/hr over 30 Minutes Intravenous Every 6 hours 04/07/13 1149 04/07/13 2047   04/07/13 0600  ceFAZolin (ANCEF) IVPB 2 g/50 mL premix     2 g 100 mL/hr over 30 Minutes Intravenous On call to O.R. 04/07/13 1610 04/07/13 9604     and started on DVT prophylaxis in the form of Xarelto.   PT and OT were ordered for total joint protocol.  Discharge planning consulted to help with postop disposition and equipment needs.  Patient had a decent night on the evening of surgery.  They started to get up OOB with therapy on day one. Hemovac drain was pulled without difficulty.  Continued to work with therapy into day two.  Dressing was changed on day two and the incision was healing well.   Patient was seen in rounds and was ready to go home.   Discharge Medications: Prior to Admission medications   Medication Sig Start Date End Date Taking? Authorizing Provider  lisinopril-hydrochlorothiazide (PRINZIDE,ZESTORETIC) 20-25 MG per tablet Take 1 tablet by mouth at bedtime.   Yes Historical Provider, MD  metFORMIN (GLUCOPHAGE) 500 MG tablet Take 500 mg by mouth 2 (two) times daily with a meal.   Yes Historical Provider, MD  omeprazole (PRILOSEC) 20 MG capsule Take 20 mg by mouth daily.   Yes Historical Provider, MD  pravastatin (PRAVACHOL) 40 MG tablet Take 40 mg by mouth at bedtime.   Yes Historical Provider, MD  SUMAtriptan (IMITREX) 100 MG tablet Take 100 mg by mouth every 2 (two) hours as needed for migraine.   Yes Historical Provider, MD  verapamil (CALAN-SR) 240 MG CR tablet Take  240 mg by mouth daily with breakfast.    Yes Historical Provider, MD  zolpidem (AMBIEN) 10 MG tablet Take 5-10 mg by mouth at bedtime as needed for sleep.   Yes Historical Provider, MD  eletriptan (RELPAX) 40 MG tablet Take 40 mg by mouth as needed for migraine. One tablet by  mouth at onset of headache. May repeat in 2 hours if headache persists or recurs.    Historical Provider, MD  methocarbamol (ROBAXIN) 500 MG tablet Take 1 tablet (500 mg total) by mouth every 6 (six) hours as needed. 04/09/13   Niylah Hassan, PA-C  oxyCODONE (OXY IR/ROXICODONE) 5 MG immediate release tablet Take 1-2 tablets (5-10 mg total) by mouth every 3 (three) hours as needed. 04/09/13   Rhealynn Myhre Julien Girt, PA-C  rivaroxaban (XARELTO) 10 MG TABS tablet Take 1 tablet (10 mg total) by mouth daily with breakfast. Take Xarelto for two and a half more weeks, then discontinue Xarelto. Once the patient has completed the Xarelto, they may resume the 81 mg Aspirin. 04/09/13   Analysia Dungee, PA-C  traMADol (ULTRAM) 50 MG tablet Take 1-2 tablets (50-100 mg total) by mouth every 6 (six) hours as needed (mild pain). 04/09/13   Mihir Flanigan Julien Girt, PA-C    Diet: Cardiac diet and Diabetic diet Activity:WBAT Follow-up:in 2 weeks Disposition - Home Discharged Condition: good       Discharge Orders   Future Orders Complete By Expires   Call MD / Call 911  As directed    Comments:     If you experience chest pain or shortness of breath, CALL 911 and be transported to the hospital emergency room.  If you develope a fever above 101 F, pus (white drainage) or increased drainage or redness at the wound, or calf pain, call your surgeon's office.   Change dressing  As directed    Comments:     Change dressing daily with sterile 4 x 4 inch gauze dressing and apply TED hose. Do not submerge the incision under water.   Constipation Prevention  As directed    Comments:     Drink plenty of fluids.  Prune juice may be helpful.  You may  use a stool softener, such as Colace (over the counter) 100 mg twice a day.  Use MiraLax (over the counter) for constipation as needed.   Diet - low sodium heart healthy  As directed    Diet Carb Modified  As directed    Discharge instructions  As directed    Comments:     Pick up stool softner and laxative for home. Do not submerge incision under water. May shower. Continue to use ice for pain and swelling from surgery.  Take Xarelto for two and a half more weeks, then discontinue Xarelto. Once the patient has completed the Xarelto, they may resume the 81 mg Aspirin.   Do not put a pillow under the knee. Place it under the heel.  As directed    Do not sit on low chairs, stoools or toilet seats, as it may be difficult to get up from low surfaces  As directed    Driving restrictions  As directed    Comments:     No driving until released by the physician.   Increase activity slowly as tolerated  As directed    Lifting restrictions  As directed    Comments:     No lifting until released by the physician.   Patient may shower  As directed    Comments:     You may shower without a dressing once there is no drainage.  Do not wash over the wound.  If drainage remains, do not shower until drainage stops.   TED hose  As directed    Comments:     Use stockings (TED hose) for 3 weeks on  both leg(s).  You may remove them at night for sleeping.   Weight bearing as tolerated  As directed        Medication List    STOP taking these medications       aspirin EC 81 MG tablet     CALCIUM + D3 PO     Fish Oil 1200 MG Caps      TAKE these medications       eletriptan 40 MG tablet  Commonly known as:  RELPAX  Take 40 mg by mouth as needed for migraine. One tablet by mouth at onset of headache. May repeat in 2 hours if headache persists or recurs.     lisinopril-hydrochlorothiazide 20-25 MG per tablet  Commonly known as:  PRINZIDE,ZESTORETIC  Take 1 tablet by mouth at bedtime.      metFORMIN 500 MG tablet  Commonly known as:  GLUCOPHAGE  Take 500 mg by mouth 2 (two) times daily with a meal.     methocarbamol 500 MG tablet  Commonly known as:  ROBAXIN  Take 1 tablet (500 mg total) by mouth every 6 (six) hours as needed.     omeprazole 20 MG capsule  Commonly known as:  PRILOSEC  Take 20 mg by mouth daily.     oxyCODONE 5 MG immediate release tablet  Commonly known as:  Oxy IR/ROXICODONE  Take 1-2 tablets (5-10 mg total) by mouth every 3 (three) hours as needed.     pravastatin 40 MG tablet  Commonly known as:  PRAVACHOL  Take 40 mg by mouth at bedtime.     rivaroxaban 10 MG Tabs tablet  Commonly known as:  XARELTO  - Take 1 tablet (10 mg total) by mouth daily with breakfast. Take Xarelto for two and a half more weeks, then discontinue Xarelto.  - Once the patient has completed the Xarelto, they may resume the 81 mg Aspirin.     SUMAtriptan 100 MG tablet  Commonly known as:  IMITREX  Take 100 mg by mouth every 2 (two) hours as needed for migraine.     traMADol 50 MG tablet  Commonly known as:  ULTRAM  Take 1-2 tablets (50-100 mg total) by mouth every 6 (six) hours as needed (mild pain).     verapamil 240 MG CR tablet  Commonly known as:  CALAN-SR  Take 240 mg by mouth daily with breakfast.     zolpidem 10 MG tablet  Commonly known as:  AMBIEN  Take 5-10 mg by mouth at bedtime as needed for sleep.       Follow-up Information   Follow up with Loanne Drilling, MD. Schedule an appointment as soon as possible for a visit on 04/22/2013.   Specialty:  Orthopedic Surgery   Contact information:   [address] [place] [address] [phone]       Signed: Patrica Duel 04/17/2013, 9:07 AM

## 2013-04-09 NOTE — Progress Notes (Signed)
[facility] is providing the following services: RW and Commode  If patient discharges after hours, please call [phone].   Renard Hamper [phone] 04/09/2013, 11:23 AM

## 2013-04-09 NOTE — Progress Notes (Signed)
Utilization review completed.  

## 2014-06-18 ENCOUNTER — Other Ambulatory Visit: Payer: Self-pay | Admitting: Orthopedic Surgery

## 2014-06-18 ENCOUNTER — Encounter (HOSPITAL_COMMUNITY): Payer: Self-pay | Admitting: Pharmacy Technician

## 2014-06-18 NOTE — Progress Notes (Signed)
Preoperative surgical orders have been place into the Epic hospital system for Lindsey [name] on 06/18/2014, 12:29 PM  by Mickel Crow for surgery on 07/01/2014.  Preop Knee Scope orders including IV Tylenol and IV Decadron as long as there are no contraindications to the above medications. Lindsey Muslim, PA-C

## 2014-06-18 NOTE — Progress Notes (Signed)
Please put orders in Epic surgery 07-01-14 pre op 06-25-14 Thanks

## 2014-06-25 ENCOUNTER — Encounter (HOSPITAL_COMMUNITY): Payer: Self-pay

## 2014-06-25 ENCOUNTER — Encounter (HOSPITAL_COMMUNITY)
Admission: RE | Admit: 2014-06-25 | Discharge: 2014-06-25 | Disposition: A | Discharge status: Home / Self Care | Payer: BC Managed Care – PPO | Source: Ambulatory Visit | Attending: Orthopedic Surgery | Admitting: Orthopedic Surgery

## 2014-06-25 ENCOUNTER — Ambulatory Visit (HOSPITAL_COMMUNITY)
Admission: RE | Admit: 2014-06-25 | Discharge: 2014-06-25 | Disposition: A | Discharge status: Home / Self Care | Payer: BC Managed Care – PPO | Source: Ambulatory Visit | Attending: Orthopedic Surgery | Admitting: Orthopedic Surgery

## 2014-06-25 DIAGNOSIS — I10 Essential (primary) hypertension: Secondary | ICD-10-CM | POA: Insufficient documentation

## 2014-06-25 DIAGNOSIS — Z01818 Encounter for other preprocedural examination: Secondary | ICD-10-CM | POA: Diagnosis present

## 2014-06-25 LAB — CBC
HCT: 40.2 % (ref 36.0–46.0)
Hemoglobin: 13.4 g/dL (ref 12.0–15.0)
MCH: 27.9 pg (ref 26.0–34.0)
MCHC: 33.3 g/dL (ref 30.0–36.0)
MCV: 83.8 fL (ref 78.0–100.0)
Platelets: 441 10*3/uL — ABNORMAL HIGH (ref 150–400)
RBC: 4.8 MIL/uL (ref 3.87–5.11)
RDW: 14 % (ref 11.5–15.5)
WBC: 7.1 10*3/uL (ref 4.0–10.5)

## 2014-06-25 LAB — BASIC METABOLIC PANEL
Anion gap: 15 (ref 5–15)
BUN: 18 mg/dL (ref 6–23)
CO2: 26 mEq/L (ref 19–32)
Calcium: 10 mg/dL (ref 8.4–10.5)
Chloride: 96 mEq/L (ref 96–112)
Creatinine, Ser: 0.88 mg/dL (ref 0.50–1.10)
GFR calc Af Amer: 83 mL/min — ABNORMAL LOW (ref >90)
GFR calc non Af Amer: 72 mL/min — ABNORMAL LOW (ref >90)
Glucose, Bld: 131 mg/dL — ABNORMAL HIGH (ref 70–99)
Potassium: 4.4 mEq/L (ref 3.7–5.3)
Sodium: 137 mEq/L (ref 137–147)

## 2014-06-25 NOTE — Patient Instructions (Signed)
LEANE LORING  06/25/2014   Your procedure is scheduled on:  07/01/2014    Report to [facility].  Follow the Signs to [place] at  0630      am  Call this number if you have problems the morning of surgery: [phone]   Remember: Eat a good healthy snack prior to bedtime    Do not eat food or drink liquids after midnight.   Take these medicines the morning of surgery with A SIP OF WATER: Prilosec, Calan SR    Do not wear jewelry, make-up or nail polish.  Do not wear lotions, powders, or perfumes.  deodorant.  Do not shave 48 hours prior to surgery.  Do not bring valuables to the hospital.  Contacts, dentures or bridgework may not be worn into surgery.      Patients discharged the day of surgery will not be allowed to drive  home.  Name and phone number of your driver: husband   [facility] - Preparing for Surgery Before surgery, you can play an important role.  Because skin is not sterile, your skin needs to be as free of germs as possible.  You can reduce the number of germs on your skin by washing with CHG (chlorahexidine gluconate) soap before surgery.  CHG is an antiseptic cleaner which kills germs and bonds with the skin to continue killing germs even after washing. Please DO NOT use if you have an allergy to CHG or antibacterial soaps.  If your skin becomes reddened/irritated stop using the CHG and inform your nurse when you arrive at Short Stay. Do not shave (including legs and underarms) for at least 48 hours prior to the first CHG shower.  You may shave your face/neck. Please follow these instructions carefully:  1.  Shower with CHG Soap the night before surgery and the  morning of Surgery.  2.  If you choose to wash your hair, wash your hair first as usual with your  normal  shampoo.  3.  After you shampoo, rinse your hair and body thoroughly to remove the  shampoo.                           4.  Use CHG as you would any other liquid soap.  You can apply  chg directly  to the skin and wash                       Gently with a scrungie or clean washcloth.  5.  Apply the CHG Soap to your body ONLY FROM THE NECK DOWN.   Do not use on face/ open                           Wound or open sores. Avoid contact with eyes, ears mouth and genitals (private parts).                       Wash face,  Genitals (private parts) with your normal soap.             6.  Wash thoroughly, paying special attention to the area where your surgery  will be performed.  7.  Thoroughly rinse your body with warm water from the neck down.  8.  DO NOT shower/wash with your normal soap after using and rinsing off  the CHG Soap.  9.  Pat yourself dry with a clean towel.            10.  Wear clean pajamas.            11.  Place clean sheets on your bed the night of your first shower and do not  sleep with pets. Day of Surgery : Do not apply any lotions/deodorants the morning of surgery.  Please wear clean clothes to the hospital/surgery center.  FAILURE TO FOLLOW THESE INSTRUCTIONS MAY RESULT IN THE CANCELLATION OF YOUR SURGERY PATIENT SIGNATURE_________________________________  NURSE SIGNATURE__________________________________  ________________________________________________________________________   Adam Phenix  An incentive spirometer is a tool that can help keep your lungs clear and active. This tool measures how well you are filling your lungs with each breath. Taking long deep breaths may help reverse or decrease the chance of developing breathing (pulmonary) problems (especially infection) following:  A long period of time when you are unable to move or be active. BEFORE THE PROCEDURE   If the spirometer includes an indicator to show your best effort, your nurse or respiratory therapist will set it to a desired goal.  If possible, sit up straight or lean slightly forward. Try not to slouch.  Hold the incentive spirometer in an upright  position. INSTRUCTIONS FOR USE  1. Sit on the edge of your bed if possible, or sit up as far as you can in bed or on a chair. 2. Hold the incentive spirometer in an upright position. 3. Breathe out normally. 4. Place the mouthpiece in your mouth and seal your lips tightly around it. 5. Breathe in slowly and as deeply as possible, raising the piston or the ball toward the top of the column. 6. Hold your breath for 3-5 seconds or for as long as possible. Allow the piston or ball to fall to the bottom of the column. 7. Remove the mouthpiece from your mouth and breathe out normally. 8. Rest for a few seconds and repeat Steps 1 through 7 at least 10 times every 1-2 hours when you are awake. Take your time and take a few normal breaths between deep breaths. 9. The spirometer may include an indicator to show your best effort. Use the indicator as a goal to work toward during each repetition. 10. After each set of 10 deep breaths, practice coughing to be sure your lungs are clear. If you have an incision (the cut made at the time of surgery), support your incision when coughing by placing a pillow or rolled up towels firmly against it. Once you are able to get out of bed, walk around indoors and cough well. You may stop using the incentive spirometer when instructed by your caregiver.  RISKS AND COMPLICATIONS  Take your time so you do not get dizzy or light-headed.  If you are in pain, you may need to take or ask for pain medication before doing incentive spirometry. It is harder to take a deep breath if you are having pain. AFTER USE  Rest and breathe slowly and easily.  It can be helpful to keep track of a log of your progress. Your caregiver can provide you with a simple table to help with this. If you are using the spirometer at home, follow these instructions: [place] IF:   You are having difficultly using the spirometer.  You have trouble using the spirometer as often as  instructed.  Your pain medication is not giving enough relief while using the spirometer.  You  develop fever of 100.5 F (38.1 C) or higher. SEEK IMMEDIATE MEDICAL CARE IF:   You cough up bloody sputum that had not been present before.  You develop fever of 102 F (38.9 C) or greater.  You develop worsening pain at or near the incision site. MAKE SURE YOU:   Understand these instructions.  Will watch your condition.  Will get help right away if you are not doing well or get worse. Document Released: 01/01/2007 Document Revised: 11/13/2011 Document Reviewed: 03/04/2007 ExitCare Patient Information 2014 Exit[employer], [place].   ________________________________________________________________________    Please read over the following fact sheets that you were given: , coughing and deep breathing exercises, leg exercises

## 2014-06-25 NOTE — Progress Notes (Signed)
CXR and EKG done 06/25/14 in [facility].

## 2014-06-30 DIAGNOSIS — Z96659 Presence of unspecified artificial knee joint: Secondary | ICD-10-CM

## 2014-06-30 DIAGNOSIS — M25869 Other specified joint disorders, unspecified knee: Secondary | ICD-10-CM

## 2014-06-30 NOTE — H&P (Signed)
CC- Lindsey [name] is a 58 y.o. female who presents with right knee pain.  HPI- . Knee Pain: Patient presents with knee pain involving the  right knee. Onset of the symptoms was several months ago. Inciting event: none known. Current symptoms include crepitus sensation and foreign body sensation. Pain is aggravated by going up and down stairs and rising after sitting.  Patient has had prior knee problems. She had a right TKA last year (04/07/13) and did very well until recently when she developed painful popping in the knee consistent with patellar clunk syndrome. Evaluation to date: plain films: normal. Treatment to date: PT which was not very effective.  Past Medical History  Diagnosis Date  . Hypertension   . Diabetes mellitus without complication   . Frequent UTI      h/o since hysterectomy/04/01/13 asymptomatic  . GERD (gastroesophageal reflux disease)   . Headache(784.0)     migraines  . 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 Arthritis     Past Surgical History  Procedure Laterality Date  . Abdominal hysterectomy    . Tonsillectomy    . Rectocele repair    . Enterocele repair    . Cholecystectomy    . Knee arthroscopy Right   . Total knee arthroplasty Right 04/07/2013    Procedure: RIGHT TOTAL KNEE ARTHROPLASTY;  Surgeon: [name], MD;  Location: [facility];  Service: Orthopedics;  Laterality: Right;    Prior to Admission medications   Medication Sig Start Date End Date Taking? Authorizing Provider  aspirin EC 81 MG tablet Take 81 mg by mouth every morning.    Historical Provider, MD  Calcium Carb-Cholecalciferol (CALCIUM 600 + D PO) Take 1 tablet by mouth at bedtime.    Historical Provider, MD  ibuprofen (ADVIL,MOTRIN) 200 MG tablet Take 400 mg by mouth every 8 (eight) hours as needed for mild pain.    Historical Provider, MD  lisinopril-hydrochlorothiazide (PRINZIDE,ZESTORETIC) 20-25 MG per tablet Take 1 tablet by mouth at bedtime.    Historical Provider, MD  metFORMIN  (GLUCOPHAGE) 500 MG tablet Take 500 mg by mouth 2 (two) times daily with a meal.    Historical Provider, MD  Multiple Vitamin (MULTIVITAMIN WITH MINERALS) TABS tablet Take 1 tablet by mouth every morning.    Historical Provider, MD  Omega-3 Fatty Acids (FISH OIL) 1200 MG CAPS Take 1 capsule by mouth 2 (two) times daily.    Historical Provider, MD  omeprazole (PRILOSEC) 20 MG capsule Take 20 mg by mouth every morning.     Historical Provider, MD  pravastatin (PRAVACHOL) 40 MG tablet Take 40 mg by mouth at bedtime.    Historical Provider, MD  SUMAtriptan (IMITREX) 100 MG tablet Take 100 mg by mouth every 2 (two) hours as needed for migraine.    Historical Provider, MD  verapamil (CALAN-SR) 240 MG CR tablet Take 240 mg by mouth daily with breakfast.     Historical Provider, MD  zolpidem (AMBIEN) 10 MG tablet Take 5-10 mg by mouth at bedtime as needed for sleep.    Historical Provider, MD   KNEE EXAM antalgic gait,No  effusion, range of motion 0-125 with crepitus on range of motion  Physical Examination: General appearance - alert, well appearing, and in no distress Mental status - alert, oriented to person, place, and time Chest - clear to auscultation, no wheezes, rales or rhonchi, symmetric air entry Heart - normal rate, regular rhythm, normal S1, S2, no murmurs, rubs, clicks or gallops Abdomen -  soft, nontender, nondistended, no masses or organomegaly Neurological - alert, oriented, normal speech, no focal findings or movement disorder noted   Asessment/Plan--- Right knee hypertrophic synovitis- - Plan right knee arthroscopy with synovectomy. Procedure risks and potential comps discussed with patient who elects to proceed. Goals are decreased pain and increased function with a high likelihood of achieving both

## 2014-07-01 ENCOUNTER — Encounter (HOSPITAL_COMMUNITY)
Admission: RE | Disposition: A | Discharge status: Home / Self Care | Payer: Self-pay | Source: Ambulatory Visit | Attending: Orthopedic Surgery

## 2014-07-01 ENCOUNTER — Encounter (HOSPITAL_COMMUNITY): Payer: Self-pay | Admitting: *Deleted

## 2014-07-01 ENCOUNTER — Encounter (HOSPITAL_COMMUNITY): Payer: BC Managed Care – PPO | Admitting: Certified Registered Nurse Anesthetist

## 2014-07-01 ENCOUNTER — Ambulatory Visit (HOSPITAL_COMMUNITY): Payer: BC Managed Care – PPO | Admitting: Certified Registered Nurse Anesthetist

## 2014-07-01 ENCOUNTER — Ambulatory Visit (HOSPITAL_COMMUNITY)
Admission: RE | Admit: 2014-07-01 | Discharge: 2014-07-01 | Disposition: A | Discharge status: Home / Self Care | Payer: BC Managed Care – PPO | Source: Ambulatory Visit | Attending: Orthopedic Surgery | Admitting: Orthopedic Surgery

## 2014-07-01 DIAGNOSIS — K219 Gastro-esophageal reflux disease without esophagitis: Secondary | ICD-10-CM | POA: Insufficient documentation

## 2014-07-01 DIAGNOSIS — Z96659 Presence of unspecified artificial knee joint: Secondary | ICD-10-CM

## 2014-07-01 DIAGNOSIS — M1990 Unspecified osteoarthritis, unspecified site: Secondary | ICD-10-CM | POA: Insufficient documentation

## 2014-07-01 DIAGNOSIS — M25861 Other specified joint disorders, right knee: Secondary | ICD-10-CM | POA: Insufficient documentation

## 2014-07-01 DIAGNOSIS — M25869 Other specified joint disorders, unspecified knee: Secondary | ICD-10-CM

## 2014-07-01 DIAGNOSIS — Z8543 Personal history of malignant neoplasm of ovary: Secondary | ICD-10-CM | POA: Insufficient documentation

## 2014-07-01 DIAGNOSIS — M67261 Synovial hypertrophy, not elsewhere classified, right lower leg: Secondary | ICD-10-CM | POA: Diagnosis not present

## 2014-07-01 DIAGNOSIS — IMO0001 Reserved for inherently not codable concepts without codable children: Secondary | ICD-10-CM

## 2014-07-01 DIAGNOSIS — I10 Essential (primary) hypertension: Secondary | ICD-10-CM | POA: Insufficient documentation

## 2014-07-01 DIAGNOSIS — E119 Type 2 diabetes mellitus without complications: Secondary | ICD-10-CM | POA: Insufficient documentation

## 2014-07-01 DIAGNOSIS — T8482XD Fibrosis due to internal orthopedic prosthetic devices, implants and grafts, subsequent encounter: Secondary | ICD-10-CM

## 2014-07-01 HISTORY — PX: KNEE ARTHROSCOPY: SHX127

## 2014-07-01 LAB — GLUCOSE, CAPILLARY
Glucose-Capillary: 131 mg/dL — ABNORMAL HIGH (ref 70–99)
Glucose-Capillary: 109 mg/dL — ABNORMAL HIGH (ref 70–99)

## 2014-07-01 SURGERY — ARTHROSCOPY, KNEE
Anesthesia: General | Site: Knee | Laterality: Right

## 2014-07-01 MED ORDER — LACTATED RINGERS IV SOLN
INTRAVENOUS | Status: DC
  Administered 2014-07-01: 08:00:00 via INTRAVENOUS
  Administered 2014-07-01: 1000 mL via INTRAVENOUS

## 2014-07-01 MED ORDER — CEFAZOLIN SODIUM-DEXTROSE 2-3 GM-% IV SOLR
2.0000 g | INTRAVENOUS | Status: AC
  Administered 2014-07-01: 2 g via INTRAVENOUS

## 2014-07-01 MED ORDER — FENTANYL CITRATE 0.05 MG/ML IJ SOLN
INTRAMUSCULAR | Status: AC
  Filled 2014-07-01: qty 5

## 2014-07-01 MED ORDER — MIDAZOLAM HCL 2 MG/2ML IJ SOLN
INTRAMUSCULAR | Status: AC
  Filled 2014-07-01: qty 2

## 2014-07-01 MED ORDER — CHLORHEXIDINE GLUCONATE 4 % EX LIQD
60.0000 mL | Freq: Once | CUTANEOUS | Status: DC
Start: 2014-07-01 — End: 2014-07-01

## 2014-07-01 MED ORDER — METOCLOPRAMIDE HCL 5 MG/ML IJ SOLN
INTRAMUSCULAR | Status: AC
  Filled 2014-07-01: qty 2

## 2014-07-01 MED ORDER — PROPOFOL 10 MG/ML IV BOLUS
INTRAVENOUS | Status: DC | PRN
  Administered 2014-07-01: 200 mg via INTRAVENOUS

## 2014-07-01 MED ORDER — LIDOCAINE HCL (CARDIAC) 20 MG/ML IV SOLN
INTRAVENOUS | Status: DC | PRN
Start: 2014-07-01 — End: 2014-07-01
  Administered 2014-07-01: 75 mg via INTRAVENOUS

## 2014-07-01 MED ORDER — SODIUM CHLORIDE 0.9 % IJ SOLN
INTRAMUSCULAR | Status: AC
  Filled 2014-07-01: qty 10

## 2014-07-01 MED ORDER — ATROPINE SULFATE 0.4 MG/ML IJ SOLN
INTRAMUSCULAR | Status: AC
  Filled 2014-07-01: qty 1

## 2014-07-01 MED ORDER — FENTANYL CITRATE 0.05 MG/ML IJ SOLN
INTRAMUSCULAR | Status: DC | PRN
  Administered 2014-07-01 (×3): 50 ug via INTRAVENOUS

## 2014-07-01 MED ORDER — HYDROMORPHONE HCL 2 MG PO TABS: 2.0000 mg | ORAL_TABLET | ORAL | Status: DC | PRN

## 2014-07-01 MED ORDER — BUPIVACAINE-EPINEPHRINE (PF) 0.25% -1:200000 IJ SOLN
INTRAMUSCULAR | Status: AC
  Filled 2014-07-01: qty 30

## 2014-07-01 MED ORDER — ACETAMINOPHEN 10 MG/ML IV SOLN
INTRAVENOUS | Status: DC | PRN
  Administered 2014-07-01: 1000 mg via INTRAVENOUS

## 2014-07-01 MED ORDER — FENTANYL CITRATE 0.05 MG/ML IJ SOLN
INTRAMUSCULAR | Status: DC
Start: 2014-07-01 — End: 2014-07-01
  Filled 2014-07-01: qty 2

## 2014-07-01 MED ORDER — ONDANSETRON HCL 4 MG/2ML IJ SOLN
INTRAMUSCULAR | Status: AC
  Filled 2014-07-01: qty 2

## 2014-07-01 MED ORDER — MIDAZOLAM HCL 5 MG/5ML IJ SOLN
INTRAMUSCULAR | Status: DC | PRN
  Administered 2014-07-01 (×2): 1 mg via INTRAVENOUS

## 2014-07-01 MED ORDER — FENTANYL CITRATE 0.05 MG/ML IJ SOLN
25.0000 ug | INTRAMUSCULAR | Status: DC | PRN
  Administered 2014-07-01 (×2): 50 ug via INTRAVENOUS

## 2014-07-01 MED ORDER — PROMETHAZINE HCL 25 MG/ML IJ SOLN: 6.2500 mg | INTRAMUSCULAR | Status: DC | PRN

## 2014-07-01 MED ORDER — LACTATED RINGERS IR SOLN
Status: DC | PRN
  Administered 2014-07-01: 9000 mL

## 2014-07-01 MED ORDER — ACETAMINOPHEN 10 MG/ML IV SOLN
1000.0000 mg | Freq: Once | INTRAVENOUS | Status: DC
  Filled 2014-07-01 (×2): qty 100

## 2014-07-01 MED ORDER — SODIUM CHLORIDE 0.9 % IV SOLN: INTRAVENOUS | Status: DC

## 2014-07-01 MED ORDER — LIDOCAINE HCL (CARDIAC) 20 MG/ML IV SOLN
INTRAVENOUS | Status: AC
  Filled 2014-07-01: qty 5

## 2014-07-01 MED ORDER — BUPIVACAINE-EPINEPHRINE 0.25% -1:200000 IJ SOLN
INTRAMUSCULAR | Status: DC | PRN
  Administered 2014-07-01: 20 mL

## 2014-07-01 MED ORDER — EPHEDRINE SULFATE 50 MG/ML IJ SOLN
INTRAMUSCULAR | Status: AC
  Filled 2014-07-01: qty 1

## 2014-07-01 MED ORDER — DEXAMETHASONE SODIUM PHOSPHATE 10 MG/ML IJ SOLN
INTRAMUSCULAR | Status: AC
  Filled 2014-07-01: qty 1

## 2014-07-01 MED ORDER — ONDANSETRON HCL 4 MG/2ML IJ SOLN
INTRAMUSCULAR | Status: DC | PRN
  Administered 2014-07-01: 4 mg via INTRAVENOUS

## 2014-07-01 MED ORDER — DEXAMETHASONE SODIUM PHOSPHATE 10 MG/ML IJ SOLN
10.0000 mg | Freq: Once | INTRAMUSCULAR | Status: AC
  Administered 2014-07-01: 10 mg via INTRAVENOUS

## 2014-07-01 MED ORDER — PROPOFOL 10 MG/ML IV BOLUS
INTRAVENOUS | Status: AC
  Filled 2014-07-01: qty 20

## 2014-07-01 MED ORDER — CEFAZOLIN SODIUM-DEXTROSE 2-3 GM-% IV SOLR
INTRAVENOUS | Status: AC
  Filled 2014-07-01: qty 50

## 2014-07-01 MED ORDER — METHOCARBAMOL 500 MG PO TABS: 500.0000 mg | ORAL_TABLET | Freq: Four times a day (QID) | ORAL | Status: DC

## 2014-07-01 MED ORDER — METOCLOPRAMIDE HCL 5 MG/ML IJ SOLN
INTRAMUSCULAR | Status: DC | PRN
  Administered 2014-07-01: 5 mg via INTRAVENOUS

## 2014-07-01 MED ORDER — HYDROMORPHONE HCL 2 MG PO TABS
2.0000 mg | ORAL_TABLET | Freq: Once | ORAL | Status: AC
Start: 2014-07-01 — End: 2014-07-01
  Administered 2014-07-01: 2 mg via ORAL
  Filled 2014-07-01: qty 1

## 2014-07-01 SURGICAL SUPPLY — 27 items
BANDAGE ELASTIC 6 VELCRO ST LF (GAUZE/BANDAGES/DRESSINGS) ×2 IMPLANT
BLADE 4.2CUDA (BLADE) ×2 IMPLANT
BLADE SURG SZ11 CARB STEEL (BLADE) IMPLANT
CUFF TOURN SGL QUICK 34 (TOURNIQUET CUFF) ×2
CUFF TRNQT CYL 34X4X40X1 (TOURNIQUET CUFF) ×1 IMPLANT
DRAPE U-SHAPE 47X51 STRL (DRAPES) ×2 IMPLANT
DRSG EMULSION OIL 3X3 NADH (GAUZE/BANDAGES/DRESSINGS) ×2 IMPLANT
DRSG PAD ABDOMINAL 8X10 ST (GAUZE/BANDAGES/DRESSINGS) ×2 IMPLANT
DURAPREP 26ML APPLICATOR (WOUND CARE) ×2 IMPLANT
GAUZE SPONGE 4X4 12PLY STRL (GAUZE/BANDAGES/DRESSINGS) ×2 IMPLANT
GLOVE BIO SURGEON STRL SZ8 (GLOVE) ×2 IMPLANT
GLOVE BIOGEL PI IND STRL 8 (GLOVE) ×1 IMPLANT
GLOVE BIOGEL PI INDICATOR 8 (GLOVE) ×1
GOWN STRL REUS W/TWL LRG LVL3 (GOWN DISPOSABLE) ×2 IMPLANT
KIT BASIN OR (CUSTOM PROCEDURE TRAY) ×2 IMPLANT
MANIFOLD NEPTUNE II (INSTRUMENTS) ×2 IMPLANT
PACK ARTHROSCOPY WL (CUSTOM PROCEDURE TRAY) ×2 IMPLANT
PACK ICE MAXI GEL EZY WRAP (MISCELLANEOUS) ×6 IMPLANT
PAD ABD 8X10 STRL (GAUZE/BANDAGES/DRESSINGS) ×1 IMPLANT
PADDING CAST COTTON 6X4 STRL (CAST SUPPLIES) ×3 IMPLANT
POSITIONER SURGICAL ARM (MISCELLANEOUS) ×2 IMPLANT
SET ARTHROSCOPY TUBING (MISCELLANEOUS) ×2
SET ARTHROSCOPY TUBING LN (MISCELLANEOUS) ×1 IMPLANT
SUT ETHILON 4 0 PS 2 18 (SUTURE) ×2 IMPLANT
TOWEL OR 17X26 10 PK STRL BLUE (TOWEL DISPOSABLE) ×2 IMPLANT
WAND 90 DEG TURBOVAC W/CORD (SURGICAL WAND) ×1 IMPLANT
WRAP KNEE MAXI GEL POST OP (GAUZE/BANDAGES/DRESSINGS) ×2 IMPLANT

## 2014-07-01 NOTE — Op Note (Signed)
NAMEADALY, PUDER NO.:  [id number]  MEDICAL RECORD NO.:  [id number]  LOCATION:  [facility]                         FACILITY:  [facility]  PHYSICIAN:  Gaynelle Arabian, M.D.    DATE OF BIRTH:  06-05-1956  DATE OF PROCEDURE:  07/01/2014 DATE OF DISCHARGE:  07/01/2014                              OPERATIVE REPORT   POSTOPERATIVE DIAGNOSIS:  Right knee patellar clunk syndrome.  POSTOPERATIVE DIAGNOSIS:  Right knee patellar clunk syndrome.  PROCEDURE:  Right knee arthroscopy with synovectomy.  SURGEON:  Gaynelle Arabian, MD  ASSISTANT:  No assistant.  ANESTHESIA:  General.  ESTIMATED BLOOD LOSS:  Minimal.  DRAINS:  None.  COMPLICATIONS:  None.  CONDITION:  Stable to recovery.  BRIEF CLINICAL NOTE:  Belissa is a 58 year old female, who had a right total knee arthroplasty done approximately a year ago.  She has done well with the exception over the past few months, developing painful popping in the knee consistent with patellar clunk syndrome.  She presents now for arthroscopy and synovectomy.  PROCEDURE IN DETAIL:  After successful administration of general anesthetic, tourniquet was placed high on the right thigh.  Right lower extremity prepped and draped in usual sterile fashion.  A standard superomedial, inferolateral incisions made.  Inflow cannula passed superomedial.  Camera passed inferolateral.  Arthroscopic visualization proceeds.  There was a large amount of hypertrophic synovial tissue at the junction of the quadriceps tendon in the superior aspect of the patella.  A superolateral portal was created with an 11 blade and then using combination of shaver and the ArthroCare device, this was debrided back to normal tissue.  The medial and lateral gutters were visualized and they appeared normal.  The joint was again inspected.  No other abnormal tissue was noted.  Once completed with the debridement, the suprapatellar pouch was restored.  The arthroscopic  equipment was removed from the lateral portals, which were closed with interrupted 4-0 nylon.  A 20 mL of 0.25% Marcaine with epinephrine was injected through the inflow cannula and now to remove the__________portal, closed with nylon.  Incisions were cleaned and dried and a bulky sterile dressing applied.  She was then awakened and transported to recovery in stable condition.     Gaynelle Arabian, M.D.     FA/MEDQ  D:  07/01/2014  T:  07/01/2014  Job:  [id number]

## 2014-07-01 NOTE — Interval H&P Note (Signed)
History and Physical Interval Note:  07/01/2014 8:23 AM  Lindsey [name]  has presented today for surgery, with the diagnosis of RIGHT KNEE PATELLA CLUNK SYNDROME  The various methods of treatment have been discussed with the patient and family. After consideration of risks, benefits and other options for treatment, the patient has consented to  Procedure(s): RIGHT ARTHROSCOPY KNEE WITH SYNOVECTOMY (Right) as a surgical intervention .  The patient's history has been reviewed, patient examined, no change in status, stable for surgery.  I have reviewed the patient's chart and labs.  Questions were answered to the patient's satisfaction.     [name]

## 2014-07-01 NOTE — Brief Op Note (Signed)
07/01/2014  9:29 AM  PATIENT:  Lindsey [name]  58 y.o. female  PRE-OPERATIVE DIAGNOSIS:  RIGHT KNEE PATELLA CLUNK SYNDROME  POST-OPERATIVE DIAGNOSIS:  RIGHT KNEE PATELLA CLUNK SYNDROME  PROCEDURE:  Procedure(s): RIGHT ARTHROSCOPY KNEE WITH SYNOVECTOMY (Right)  SURGEON:  Surgeon(s) and Role:    * [name], MD - Primary  PHYSICIAN ASSISTANT:   ASSISTANTS: none   ANESTHESIA:   general  EBL:     BLOOD ADMINISTERED:none  DRAINS: none   LOCAL MEDICATIONS USED:  MARCAINE     COUNTS:  YES  TOURNIQUET:   Total Tourniquet Time Documented: Thigh (Right) - 0 minutes Total: Thigh (Right) - 0 minutes   DICTATION: .Other Dictation: Dictation Number [id number]  PLAN OF CARE: Discharge to home after PACU  PATIENT DISPOSITION:  PACU - hemodynamically stable.

## 2014-07-01 NOTE — Discharge Instructions (Signed)

## 2014-07-01 NOTE — Transfer of Care (Signed)
Immediate Anesthesia Transfer of Care Note  Patient: Lindsey [name]  Procedure(s) Performed: Procedure(s): RIGHT ARTHROSCOPY KNEE WITH SYNOVECTOMY (Right)  Patient Location: PACU  Anesthesia Type:General  Level of Consciousness: awake, oriented, patient cooperative, lethargic and responds to stimulation  Airway & Oxygen Therapy: Patient Spontanous Breathing and Patient connected to face mask oxygen  Post-op Assessment: Report given to PACU RN, Post -op Vital signs reviewed and stable and Patient moving all extremities  Post vital signs: Reviewed and stable  Complications: No apparent anesthesia complications

## 2014-07-01 NOTE — Anesthesia Procedure Notes (Signed)
Procedure Name: LMA Insertion Date/Time: 07/01/2014 8:45 AM Performed by: Ofilia Neas Pre-anesthesia Checklist: Patient identified, Timeout performed, Emergency Drugs available, Suction available and Patient being monitored Patient Re-evaluated:Patient Re-evaluated prior to inductionOxygen Delivery Method: Circle system utilized Preoxygenation: Pre-oxygenation with 100% oxygen Intubation Type: IV induction LMA: LMA inserted LMA Size: 4.0 Number of attempts: 1 Placement Confirmation: positive ETCO2 and breath sounds checked- equal and bilateral Tube secured with: Tape Dental Injury: Teeth and Oropharynx as per pre-operative assessment

## 2014-07-01 NOTE — Anesthesia Postprocedure Evaluation (Signed)
  Anesthesia Post-op Note  Patient: Lindsey [name]  Procedure(s) Performed: Procedure(s) (LRB): RIGHT ARTHROSCOPY KNEE WITH SYNOVECTOMY (Right)  Patient Location: PACU  Anesthesia Type: General  Level of Consciousness: awake and alert   Airway and Oxygen Therapy: Patient Spontanous Breathing  Post-op Pain: mild  Post-op Assessment: Post-op Vital signs reviewed, Patient's Cardiovascular Status Stable, Respiratory Function Stable, Patent Airway and No signs of Nausea or vomiting  Last Vitals:  Filed Vitals:   07/01/14 1030  BP: 126/58  Pulse: 65  Temp:   Resp: 13    Post-op Vital Signs: stable   Complications: No apparent anesthesia complications

## 2014-07-01 NOTE — Anesthesia Preprocedure Evaluation (Signed)
Anesthesia Evaluation  Patient identified by MRN, date of birth, ID band Patient awake    Reviewed: Allergy & Precautions, H&P , NPO status , Patient's Chart, lab work & pertinent test results  Airway Mallampati: II  TM Distance: >3 FB Neck ROM: Full    Dental no notable dental hx.    Pulmonary neg pulmonary ROS,  breath sounds clear to auscultation  Pulmonary exam normal       Cardiovascular Exercise Tolerance: Good hypertension, Pt. on medications Rhythm:Regular Rate:Normal     Neuro/Psych  Headaches, negative psych ROS   GI/Hepatic Neg liver ROS, GERD-  Medicated,  Endo/Other  diabetes, Type 2, Oral Hypoglycemic Agents  Renal/GU negative Renal ROS  negative genitourinary   Musculoskeletal  (+) Arthritis -,   Abdominal (+) + obese,   Peds negative pediatric ROS (+)  Hematology  (+) anemia ,   Anesthesia Other Findings   Reproductive/Obstetrics negative OB ROS                             Anesthesia Physical Anesthesia Plan  ASA: III  Anesthesia Plan: General   Post-op Pain Management:    Induction: Intravenous  Airway Management Planned: LMA  Additional Equipment:   Intra-op Plan:   Post-operative Plan: Extubation in OR  Informed Consent: I have reviewed the patients History and Physical, chart, labs and discussed the procedure including the risks, benefits and alternatives for the proposed anesthesia with the patient or authorized representative who has indicated his/her understanding and acceptance.   Dental advisory given  Plan Discussed with: CRNA  Anesthesia Plan Comments:         Anesthesia Quick Evaluation

## 2014-07-02 ENCOUNTER — Encounter (HOSPITAL_COMMUNITY): Payer: Self-pay | Admitting: Orthopedic Surgery

## 2016-02-08 DIAGNOSIS — E119 Type 2 diabetes mellitus without complications: Secondary | ICD-10-CM | POA: Diagnosis not present

## 2016-02-08 DIAGNOSIS — M791 Myalgia: Secondary | ICD-10-CM | POA: Diagnosis not present

## 2016-02-08 DIAGNOSIS — I10 Essential (primary) hypertension: Secondary | ICD-10-CM | POA: Diagnosis not present

## 2016-02-08 DIAGNOSIS — E782 Mixed hyperlipidemia: Secondary | ICD-10-CM | POA: Diagnosis not present

## 2016-02-11 DIAGNOSIS — F5101 Primary insomnia: Secondary | ICD-10-CM | POA: Diagnosis not present

## 2016-02-11 DIAGNOSIS — E782 Mixed hyperlipidemia: Secondary | ICD-10-CM | POA: Diagnosis not present

## 2016-02-11 DIAGNOSIS — E1142 Type 2 diabetes mellitus with diabetic polyneuropathy: Secondary | ICD-10-CM | POA: Diagnosis not present

## 2016-02-11 DIAGNOSIS — I10 Essential (primary) hypertension: Secondary | ICD-10-CM | POA: Diagnosis not present

## 2016-02-14 DIAGNOSIS — M5136 Other intervertebral disc degeneration, lumbar region: Secondary | ICD-10-CM | POA: Diagnosis not present

## 2016-02-14 DIAGNOSIS — M5416 Radiculopathy, lumbar region: Secondary | ICD-10-CM | POA: Diagnosis not present

## 2016-02-14 DIAGNOSIS — M5412 Radiculopathy, cervical region: Secondary | ICD-10-CM | POA: Diagnosis not present

## 2016-02-14 DIAGNOSIS — M7582 Other shoulder lesions, left shoulder: Secondary | ICD-10-CM | POA: Diagnosis not present

## 2016-03-03 DIAGNOSIS — D473 Essential (hemorrhagic) thrombocythemia: Secondary | ICD-10-CM | POA: Diagnosis not present

## 2016-05-17 DIAGNOSIS — E1142 Type 2 diabetes mellitus with diabetic polyneuropathy: Secondary | ICD-10-CM | POA: Diagnosis not present

## 2016-05-17 DIAGNOSIS — I10 Essential (primary) hypertension: Secondary | ICD-10-CM | POA: Diagnosis not present

## 2016-05-17 DIAGNOSIS — D500 Iron deficiency anemia secondary to blood loss (chronic): Secondary | ICD-10-CM | POA: Diagnosis not present

## 2016-05-17 DIAGNOSIS — E782 Mixed hyperlipidemia: Secondary | ICD-10-CM | POA: Diagnosis not present

## 2016-05-23 DIAGNOSIS — E1142 Type 2 diabetes mellitus with diabetic polyneuropathy: Secondary | ICD-10-CM | POA: Diagnosis not present

## 2016-05-23 DIAGNOSIS — F5101 Primary insomnia: Secondary | ICD-10-CM | POA: Diagnosis not present

## 2016-05-23 DIAGNOSIS — E782 Mixed hyperlipidemia: Secondary | ICD-10-CM | POA: Diagnosis not present

## 2016-05-23 DIAGNOSIS — I10 Essential (primary) hypertension: Secondary | ICD-10-CM | POA: Diagnosis not present

## 2016-05-23 DIAGNOSIS — Z23 Encounter for immunization: Secondary | ICD-10-CM | POA: Diagnosis not present

## 2016-06-26 DIAGNOSIS — Z885 Allergy status to narcotic agent status: Secondary | ICD-10-CM | POA: Diagnosis not present

## 2016-06-26 DIAGNOSIS — Z6825 Body mass index (BMI) 25.0-25.9, adult: Secondary | ICD-10-CM | POA: Diagnosis not present

## 2016-06-26 DIAGNOSIS — Z90710 Acquired absence of both cervix and uterus: Secondary | ICD-10-CM | POA: Diagnosis not present

## 2016-06-26 DIAGNOSIS — Z881 Allergy status to other antibiotic agents status: Secondary | ICD-10-CM | POA: Diagnosis not present

## 2016-06-26 DIAGNOSIS — C569 Malignant neoplasm of unspecified ovary: Secondary | ICD-10-CM | POA: Diagnosis not present

## 2016-06-26 DIAGNOSIS — Z882 Allergy status to sulfonamides status: Secondary | ICD-10-CM | POA: Diagnosis not present

## 2016-07-12 IMAGING — CR DG CHEST 2V
2 series · 2 of 2 positions shown · non-contrast
Comparison: Portable chest x-ray of 08/01/2012

CLINICAL DATA: Preop, on medication for hypertension

EXAM:
CHEST  2 VIEW

[w chest pa]
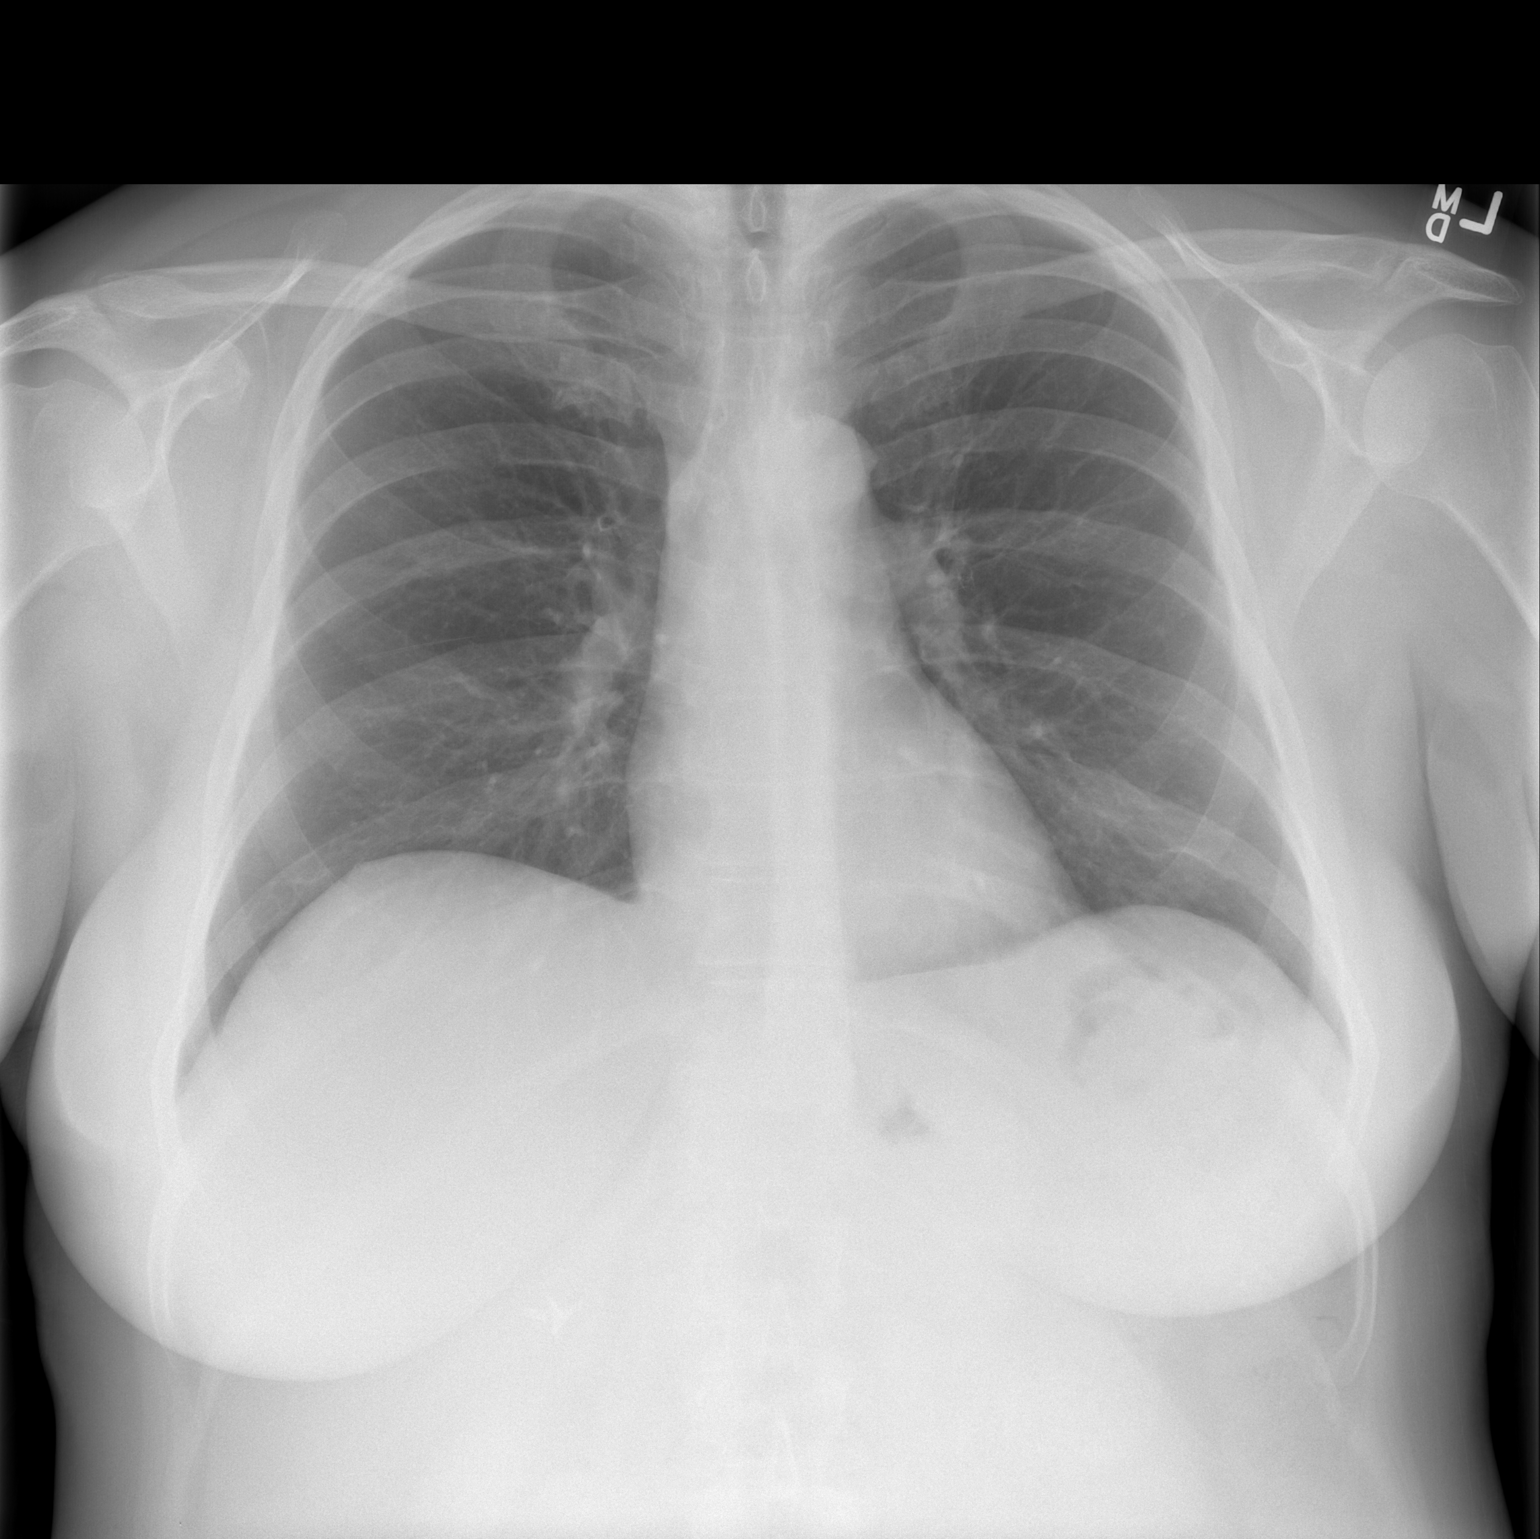

[w chest lat]
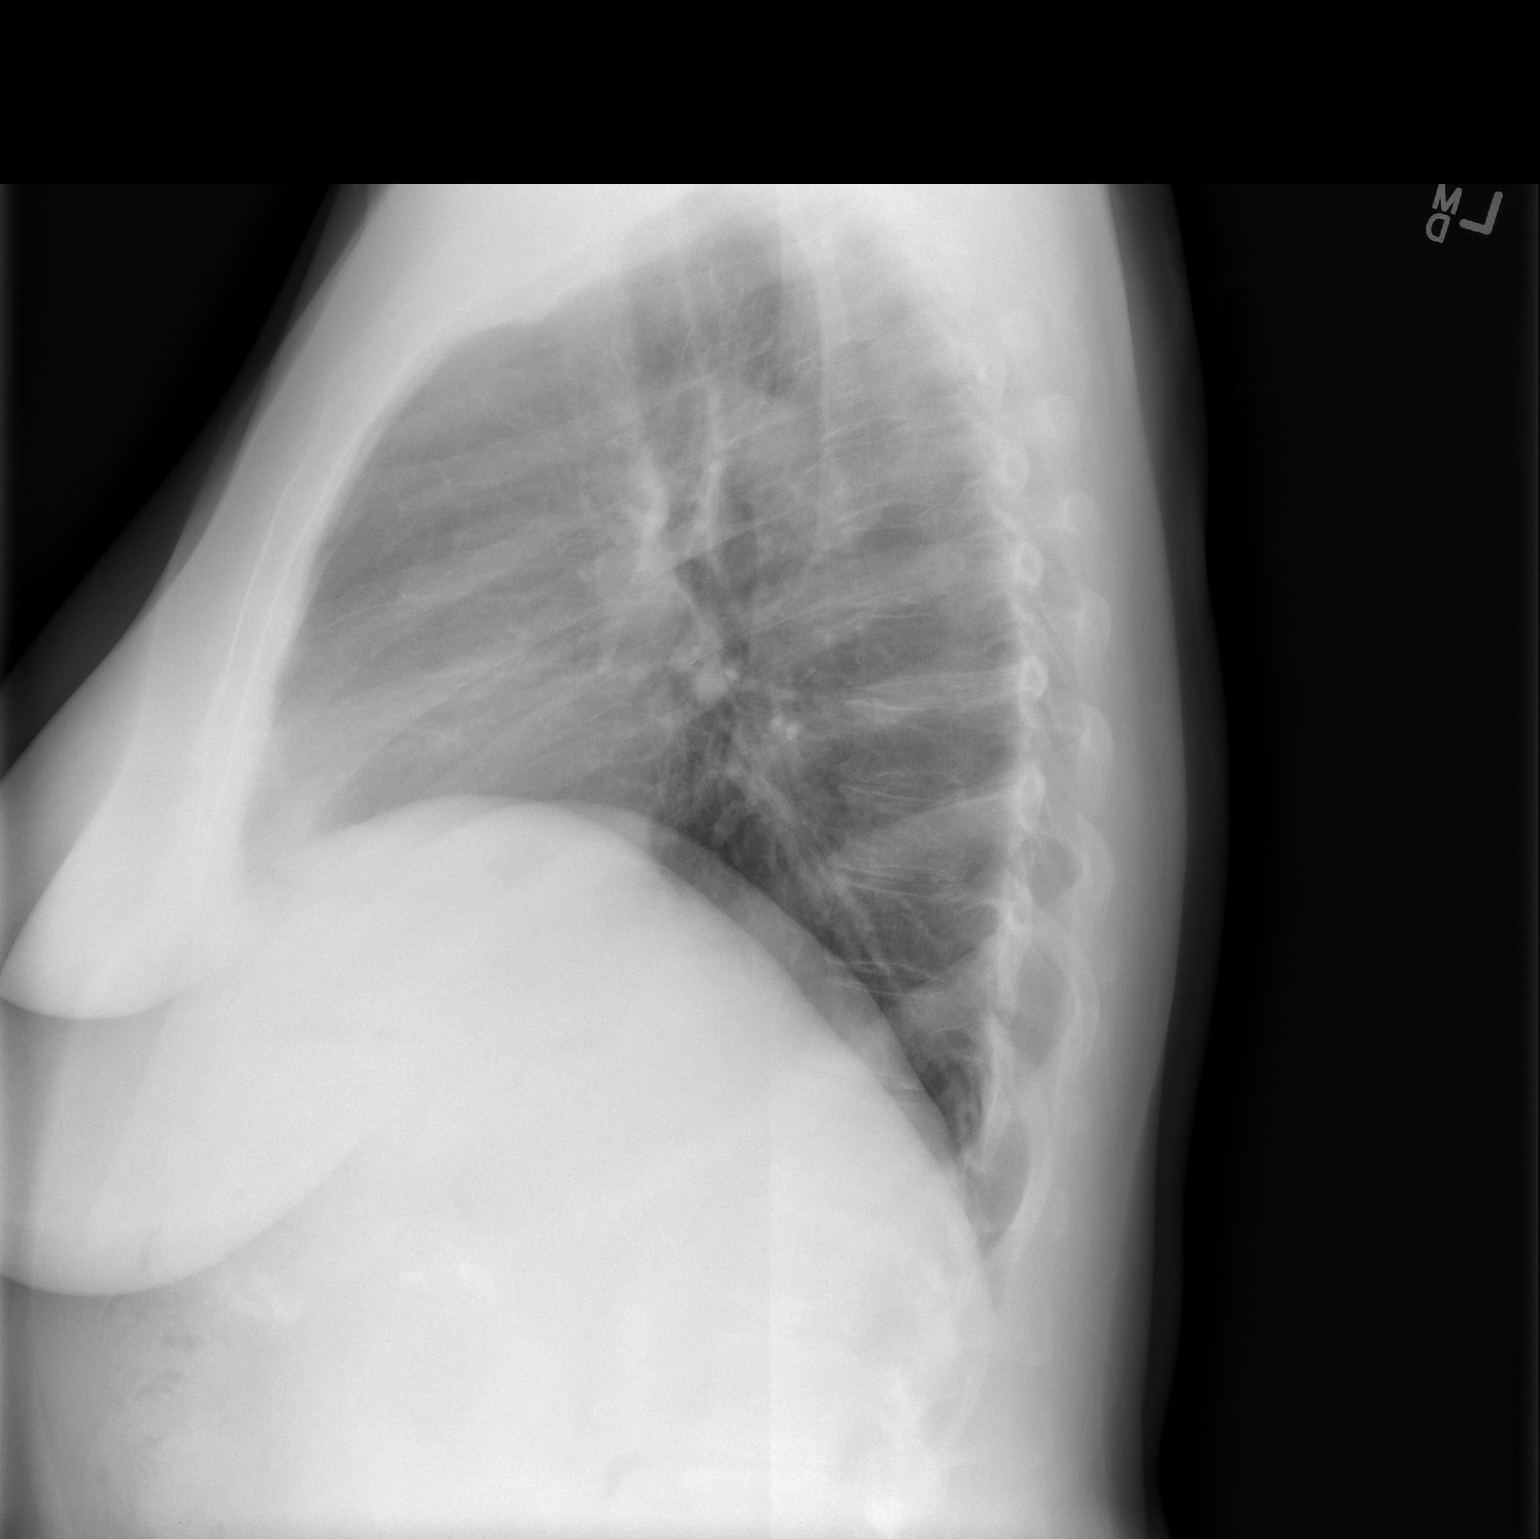

[2 of 2 positions shown; findings below may reference images not displayed]

FINDINGS: No active infiltrate or effusion is seen. Mediastinal and hilar
contours are unremarkable. The heart is within normal limits in
size. No bony abnormality is seen. Surgical clips are noted in the
right upper quadrant from prior cholecystectomy.
IMPRESSION: No active cardiopulmonary disease.

## 2016-07-21 DIAGNOSIS — Z1231 Encounter for screening mammogram for malignant neoplasm of breast: Secondary | ICD-10-CM | POA: Diagnosis not present

## 2016-08-08 DIAGNOSIS — R928 Other abnormal and inconclusive findings on diagnostic imaging of breast: Secondary | ICD-10-CM | POA: Diagnosis not present

## 2016-08-08 DIAGNOSIS — R922 Inconclusive mammogram: Secondary | ICD-10-CM | POA: Diagnosis not present

## 2016-08-21 DIAGNOSIS — D473 Essential (hemorrhagic) thrombocythemia: Secondary | ICD-10-CM | POA: Diagnosis not present

## 2016-09-07 DIAGNOSIS — E782 Mixed hyperlipidemia: Secondary | ICD-10-CM | POA: Diagnosis not present

## 2016-09-07 DIAGNOSIS — Z79899 Other long term (current) drug therapy: Secondary | ICD-10-CM | POA: Diagnosis not present

## 2016-09-07 DIAGNOSIS — E119 Type 2 diabetes mellitus without complications: Secondary | ICD-10-CM | POA: Diagnosis not present

## 2016-09-12 DIAGNOSIS — E1142 Type 2 diabetes mellitus with diabetic polyneuropathy: Secondary | ICD-10-CM | POA: Diagnosis not present

## 2016-09-12 DIAGNOSIS — I10 Essential (primary) hypertension: Secondary | ICD-10-CM | POA: Diagnosis not present

## 2016-09-12 DIAGNOSIS — F5101 Primary insomnia: Secondary | ICD-10-CM | POA: Diagnosis not present

## 2016-09-12 DIAGNOSIS — E782 Mixed hyperlipidemia: Secondary | ICD-10-CM | POA: Diagnosis not present

## 2016-10-02 DIAGNOSIS — J111 Influenza due to unidentified influenza virus with other respiratory manifestations: Secondary | ICD-10-CM | POA: Diagnosis not present

## 2017-01-24 DIAGNOSIS — R300 Dysuria: Secondary | ICD-10-CM | POA: Diagnosis not present

## 2017-01-24 DIAGNOSIS — D3910 Neoplasm of uncertain behavior of unspecified ovary: Secondary | ICD-10-CM | POA: Diagnosis not present

## 2017-01-24 DIAGNOSIS — Z01419 Encounter for gynecological examination (general) (routine) without abnormal findings: Secondary | ICD-10-CM | POA: Diagnosis not present

## 2017-02-27 DIAGNOSIS — D473 Essential (hemorrhagic) thrombocythemia: Secondary | ICD-10-CM | POA: Diagnosis not present

## 2017-02-27 DIAGNOSIS — E119 Type 2 diabetes mellitus without complications: Secondary | ICD-10-CM | POA: Diagnosis not present

## 2017-02-27 DIAGNOSIS — E782 Mixed hyperlipidemia: Secondary | ICD-10-CM | POA: Diagnosis not present

## 2017-02-27 DIAGNOSIS — I10 Essential (primary) hypertension: Secondary | ICD-10-CM | POA: Diagnosis not present

## 2017-03-01 DIAGNOSIS — E782 Mixed hyperlipidemia: Secondary | ICD-10-CM | POA: Diagnosis not present

## 2017-03-01 DIAGNOSIS — I10 Essential (primary) hypertension: Secondary | ICD-10-CM | POA: Diagnosis not present

## 2017-03-01 DIAGNOSIS — G43009 Migraine without aura, not intractable, without status migrainosus: Secondary | ICD-10-CM | POA: Diagnosis not present

## 2017-03-01 DIAGNOSIS — E119 Type 2 diabetes mellitus without complications: Secondary | ICD-10-CM | POA: Diagnosis not present

## 2017-07-17 DIAGNOSIS — M25561 Pain in right knee: Secondary | ICD-10-CM | POA: Diagnosis not present

## 2017-07-17 DIAGNOSIS — M61461 Other calcification of muscle, right lower leg: Secondary | ICD-10-CM | POA: Diagnosis not present

## 2017-07-17 DIAGNOSIS — Z96651 Presence of right artificial knee joint: Secondary | ICD-10-CM | POA: Diagnosis not present

## 2017-08-15 DIAGNOSIS — Z0001 Encounter for general adult medical examination with abnormal findings: Secondary | ICD-10-CM | POA: Diagnosis not present

## 2017-08-15 DIAGNOSIS — F320 Major depressive disorder, single episode, mild: Secondary | ICD-10-CM | POA: Diagnosis not present

## 2017-08-15 DIAGNOSIS — Z6827 Body mass index (BMI) 27.0-27.9, adult: Secondary | ICD-10-CM | POA: Diagnosis not present

## 2017-08-22 DIAGNOSIS — E119 Type 2 diabetes mellitus without complications: Secondary | ICD-10-CM | POA: Diagnosis not present

## 2017-08-22 DIAGNOSIS — I10 Essential (primary) hypertension: Secondary | ICD-10-CM | POA: Diagnosis not present

## 2017-08-22 DIAGNOSIS — E782 Mixed hyperlipidemia: Secondary | ICD-10-CM | POA: Diagnosis not present

## 2017-08-24 DIAGNOSIS — I10 Essential (primary) hypertension: Secondary | ICD-10-CM | POA: Diagnosis not present

## 2017-08-24 DIAGNOSIS — G43009 Migraine without aura, not intractable, without status migrainosus: Secondary | ICD-10-CM | POA: Diagnosis not present

## 2017-08-24 DIAGNOSIS — E782 Mixed hyperlipidemia: Secondary | ICD-10-CM | POA: Diagnosis not present

## 2017-08-24 DIAGNOSIS — E119 Type 2 diabetes mellitus without complications: Secondary | ICD-10-CM | POA: Diagnosis not present

## 2017-09-11 DIAGNOSIS — Z1231 Encounter for screening mammogram for malignant neoplasm of breast: Secondary | ICD-10-CM | POA: Diagnosis not present

## 2017-09-27 DIAGNOSIS — R922 Inconclusive mammogram: Secondary | ICD-10-CM | POA: Diagnosis not present

## 2017-09-27 DIAGNOSIS — R928 Other abnormal and inconclusive findings on diagnostic imaging of breast: Secondary | ICD-10-CM | POA: Diagnosis not present

## 2017-09-28 DIAGNOSIS — L03019 Cellulitis of unspecified finger: Secondary | ICD-10-CM | POA: Diagnosis not present

## 2017-10-16 DIAGNOSIS — L601 Onycholysis: Secondary | ICD-10-CM | POA: Diagnosis not present

## 2017-10-16 DIAGNOSIS — B351 Tinea unguium: Secondary | ICD-10-CM | POA: Diagnosis not present

## 2017-10-16 DIAGNOSIS — L820 Inflamed seborrheic keratosis: Secondary | ICD-10-CM | POA: Diagnosis not present

## 2017-11-14 DIAGNOSIS — N3001 Acute cystitis with hematuria: Secondary | ICD-10-CM | POA: Diagnosis not present

## 2017-11-27 DIAGNOSIS — B351 Tinea unguium: Secondary | ICD-10-CM | POA: Diagnosis not present

## 2018-01-10 DIAGNOSIS — E782 Mixed hyperlipidemia: Secondary | ICD-10-CM | POA: Diagnosis not present

## 2018-01-10 DIAGNOSIS — C561 Malignant neoplasm of right ovary: Secondary | ICD-10-CM | POA: Diagnosis not present

## 2018-01-10 DIAGNOSIS — D751 Secondary polycythemia: Secondary | ICD-10-CM | POA: Diagnosis not present

## 2018-01-10 DIAGNOSIS — I10 Essential (primary) hypertension: Secondary | ICD-10-CM | POA: Diagnosis not present

## 2018-01-10 DIAGNOSIS — E119 Type 2 diabetes mellitus without complications: Secondary | ICD-10-CM | POA: Diagnosis not present

## 2018-01-15 DIAGNOSIS — G43009 Migraine without aura, not intractable, without status migrainosus: Secondary | ICD-10-CM | POA: Diagnosis not present

## 2018-01-15 DIAGNOSIS — I10 Essential (primary) hypertension: Secondary | ICD-10-CM | POA: Diagnosis not present

## 2018-01-15 DIAGNOSIS — E782 Mixed hyperlipidemia: Secondary | ICD-10-CM | POA: Diagnosis not present

## 2018-01-15 DIAGNOSIS — E119 Type 2 diabetes mellitus without complications: Secondary | ICD-10-CM | POA: Diagnosis not present

## 2018-03-19 DIAGNOSIS — B351 Tinea unguium: Secondary | ICD-10-CM | POA: Diagnosis not present

## 2018-03-25 DIAGNOSIS — J189 Pneumonia, unspecified organism: Secondary | ICD-10-CM | POA: Diagnosis not present

## 2018-03-25 DIAGNOSIS — N3001 Acute cystitis with hematuria: Secondary | ICD-10-CM | POA: Diagnosis not present

## 2018-05-16 DIAGNOSIS — E782 Mixed hyperlipidemia: Secondary | ICD-10-CM | POA: Diagnosis not present

## 2018-05-16 DIAGNOSIS — E119 Type 2 diabetes mellitus without complications: Secondary | ICD-10-CM | POA: Diagnosis not present

## 2018-05-16 DIAGNOSIS — I10 Essential (primary) hypertension: Secondary | ICD-10-CM | POA: Diagnosis not present

## 2018-05-21 DIAGNOSIS — Z23 Encounter for immunization: Secondary | ICD-10-CM | POA: Diagnosis not present

## 2018-05-21 DIAGNOSIS — G43009 Migraine without aura, not intractable, without status migrainosus: Secondary | ICD-10-CM | POA: Diagnosis not present

## 2018-05-21 DIAGNOSIS — E782 Mixed hyperlipidemia: Secondary | ICD-10-CM | POA: Diagnosis not present

## 2018-05-21 DIAGNOSIS — I10 Essential (primary) hypertension: Secondary | ICD-10-CM | POA: Diagnosis not present

## 2018-05-21 DIAGNOSIS — E1169 Type 2 diabetes mellitus with other specified complication: Secondary | ICD-10-CM | POA: Diagnosis not present

## 2018-07-23 DIAGNOSIS — B351 Tinea unguium: Secondary | ICD-10-CM | POA: Diagnosis not present

## 2018-08-20 DIAGNOSIS — Z1231 Encounter for screening mammogram for malignant neoplasm of breast: Secondary | ICD-10-CM | POA: Diagnosis not present

## 2018-08-20 DIAGNOSIS — Z1211 Encounter for screening for malignant neoplasm of colon: Secondary | ICD-10-CM | POA: Diagnosis not present

## 2018-08-20 DIAGNOSIS — E663 Overweight: Secondary | ICD-10-CM | POA: Diagnosis not present

## 2018-08-20 DIAGNOSIS — Z0000 Encounter for general adult medical examination without abnormal findings: Secondary | ICD-10-CM | POA: Diagnosis not present

## 2018-09-08 DIAGNOSIS — M542 Cervicalgia: Secondary | ICD-10-CM | POA: Diagnosis not present

## 2018-09-09 DIAGNOSIS — Z1212 Encounter for screening for malignant neoplasm of rectum: Secondary | ICD-10-CM | POA: Diagnosis not present

## 2018-09-09 DIAGNOSIS — Z1211 Encounter for screening for malignant neoplasm of colon: Secondary | ICD-10-CM | POA: Diagnosis not present

## 2018-09-10 LAB — COLOGUARD: Cologuard: NEGATIVE

## 2018-09-23 DIAGNOSIS — E1169 Type 2 diabetes mellitus with other specified complication: Secondary | ICD-10-CM | POA: Diagnosis not present

## 2018-09-23 DIAGNOSIS — E782 Mixed hyperlipidemia: Secondary | ICD-10-CM | POA: Diagnosis not present

## 2018-09-25 DIAGNOSIS — Z1231 Encounter for screening mammogram for malignant neoplasm of breast: Secondary | ICD-10-CM | POA: Diagnosis not present

## 2018-09-27 DIAGNOSIS — I10 Essential (primary) hypertension: Secondary | ICD-10-CM | POA: Diagnosis not present

## 2018-09-27 DIAGNOSIS — K5730 Diverticulosis of large intestine without perforation or abscess without bleeding: Secondary | ICD-10-CM | POA: Diagnosis not present

## 2018-09-27 DIAGNOSIS — G43009 Migraine without aura, not intractable, without status migrainosus: Secondary | ICD-10-CM | POA: Diagnosis not present

## 2018-09-27 DIAGNOSIS — R1906 Epigastric swelling, mass or lump: Secondary | ICD-10-CM | POA: Diagnosis not present

## 2018-09-27 DIAGNOSIS — E1121 Type 2 diabetes mellitus with diabetic nephropathy: Secondary | ICD-10-CM | POA: Diagnosis not present

## 2018-09-27 DIAGNOSIS — E782 Mixed hyperlipidemia: Secondary | ICD-10-CM | POA: Diagnosis not present

## 2019-01-07 DIAGNOSIS — E782 Mixed hyperlipidemia: Secondary | ICD-10-CM | POA: Diagnosis not present

## 2019-01-07 DIAGNOSIS — E1121 Type 2 diabetes mellitus with diabetic nephropathy: Secondary | ICD-10-CM | POA: Diagnosis not present

## 2019-01-07 DIAGNOSIS — I10 Essential (primary) hypertension: Secondary | ICD-10-CM | POA: Diagnosis not present

## 2019-01-07 DIAGNOSIS — F411 Generalized anxiety disorder: Secondary | ICD-10-CM | POA: Diagnosis not present

## 2019-01-21 DIAGNOSIS — N3000 Acute cystitis without hematuria: Secondary | ICD-10-CM | POA: Diagnosis not present

## 2019-01-29 DIAGNOSIS — F411 Generalized anxiety disorder: Secondary | ICD-10-CM | POA: Diagnosis not present

## 2019-01-29 DIAGNOSIS — G43009 Migraine without aura, not intractable, without status migrainosus: Secondary | ICD-10-CM | POA: Diagnosis not present

## 2019-03-24 DIAGNOSIS — Z5321 Procedure and treatment not carried out due to patient leaving prior to being seen by health care provider: Secondary | ICD-10-CM | POA: Diagnosis not present

## 2019-03-24 DIAGNOSIS — X58XXXA Exposure to other specified factors, initial encounter: Secondary | ICD-10-CM | POA: Diagnosis not present

## 2019-03-24 DIAGNOSIS — T7840XA Allergy, unspecified, initial encounter: Secondary | ICD-10-CM | POA: Diagnosis not present

## 2019-04-07 DIAGNOSIS — I10 Essential (primary) hypertension: Secondary | ICD-10-CM | POA: Diagnosis not present

## 2019-04-07 DIAGNOSIS — E1169 Type 2 diabetes mellitus with other specified complication: Secondary | ICD-10-CM | POA: Diagnosis not present

## 2019-04-07 DIAGNOSIS — E782 Mixed hyperlipidemia: Secondary | ICD-10-CM | POA: Diagnosis not present

## 2019-04-10 DIAGNOSIS — I10 Essential (primary) hypertension: Secondary | ICD-10-CM | POA: Diagnosis not present

## 2019-04-10 DIAGNOSIS — F411 Generalized anxiety disorder: Secondary | ICD-10-CM | POA: Diagnosis not present

## 2019-04-10 DIAGNOSIS — E1121 Type 2 diabetes mellitus with diabetic nephropathy: Secondary | ICD-10-CM | POA: Diagnosis not present

## 2019-04-10 DIAGNOSIS — E782 Mixed hyperlipidemia: Secondary | ICD-10-CM | POA: Diagnosis not present

## 2019-07-14 DIAGNOSIS — E1121 Type 2 diabetes mellitus with diabetic nephropathy: Secondary | ICD-10-CM | POA: Diagnosis not present

## 2019-07-14 DIAGNOSIS — E1142 Type 2 diabetes mellitus with diabetic polyneuropathy: Secondary | ICD-10-CM | POA: Diagnosis not present

## 2019-07-14 DIAGNOSIS — I10 Essential (primary) hypertension: Secondary | ICD-10-CM | POA: Diagnosis not present

## 2019-07-14 DIAGNOSIS — E782 Mixed hyperlipidemia: Secondary | ICD-10-CM | POA: Diagnosis not present

## 2019-07-17 DIAGNOSIS — R10817 Generalized abdominal tenderness: Secondary | ICD-10-CM | POA: Diagnosis not present

## 2019-07-17 DIAGNOSIS — G43019 Migraine without aura, intractable, without status migrainosus: Secondary | ICD-10-CM | POA: Diagnosis not present

## 2019-07-17 DIAGNOSIS — R3129 Other microscopic hematuria: Secondary | ICD-10-CM | POA: Diagnosis not present

## 2019-07-17 DIAGNOSIS — M545 Low back pain: Secondary | ICD-10-CM | POA: Diagnosis not present

## 2019-07-17 DIAGNOSIS — E1121 Type 2 diabetes mellitus with diabetic nephropathy: Secondary | ICD-10-CM | POA: Diagnosis not present

## 2019-07-17 DIAGNOSIS — I10 Essential (primary) hypertension: Secondary | ICD-10-CM | POA: Diagnosis not present

## 2019-07-17 DIAGNOSIS — N3001 Acute cystitis with hematuria: Secondary | ICD-10-CM | POA: Diagnosis not present

## 2019-07-29 ENCOUNTER — Telehealth: Payer: Self-pay

## 2019-07-29 ENCOUNTER — Ambulatory Visit: Payer: BC Managed Care – PPO | Admitting: Neurology

## 2019-07-29 ENCOUNTER — Other Ambulatory Visit: Payer: Self-pay

## 2019-07-29 ENCOUNTER — Encounter: Payer: Self-pay | Admitting: Neurology

## 2019-07-29 VITALS — BP 166/101 | HR 62 | Ht 65.0 in | Wt 186.0 lb

## 2019-07-29 DIAGNOSIS — G43019 Migraine without aura, intractable, without status migrainosus: Secondary | ICD-10-CM

## 2019-07-29 DIAGNOSIS — G4489 Other headache syndrome: Secondary | ICD-10-CM | POA: Diagnosis not present

## 2019-07-29 MED ORDER — AIMOVIG 140 MG/ML ~~LOC~~ SOAJ: 140.0000 mg | SUBCUTANEOUS | 5 refills | Status: DC

## 2019-07-29 NOTE — Progress Notes (Addendum)
Subjective:    Patient ID: Lindsey [name] is a 63 y.o. female.  HPI    Star Age, MD, PhD [facility] [address], [address] [place], [place] [address]  Dear Dr. Tobie Poet,   I saw your patient, Lindsey [name], upon your kind request in my neurologic clinic today for initial consultation of her recurrent headaches.  The patient is unaccompanied today.  As you know, Lindsey [name] is a 63 year old right-handed woman with an underlying medical history of arthritis, status post right total knee replacement in 2014, hypertension, reflux disease, anxiety, hyperlipidemia, reflux disease, recurrent UTI, diabetes, and borderline obesity, who reports a longstanding history of migraine headaches, essentially since her 37s.  She has had multiple medication intolerances, reports that she is sensitive to medications, her headaches are currently worse because she has not been able to take the Aimovig injections.  She was on Aimovig for a year and had exceedingly good results with it but lately, her insurance has not covered it and it is too expensive for her.  She has recently tried Topamax and had significant side effects, including changes in her taste and a drugged feeling.  In the past, she was on verapamil, which did not help.  She Also recalls trying amitriptyline/Elavil, and it was not Effective or she may have had a reaction to it.  She generally speaking is quite sensitive to medications, she has tried sertraline, Celexa and Effexor, all caused side effects and sometimes even worsening of her headaches.  She had recent blood work through your office, was started on medication for cholesterol, states that her LDL was elevated and her A1c was above 7.  She has a eye examination typically once a year and is up-to-date with her eye exam, she has prescription eyeglasses.  Her daughter has developed migraines, her son has no migraines.  She lives with her husband, she currently does not  drink any caffeine, she is a non-smoker and drinks alcohol infrequently, maybe once a month.  She tries to hydrate well, triggers for her headaches are typically stress, dehydration and feeling of neck tension, she has not noticed any food triggers.  She works as an Scientist, water quality.  She has never had a brain MRI.  She denies any snoring or nocturia or morning headaches.  Her headaches are typically one-sided, typically left-sided and also in the left neck area, throbbing, associated with nausea but typically no vomiting.  She has typically no visual aura.  She does get sensitive to light, current headache frequency is at least 2 or 3/week, lasting for several hours.  She takes Imitrex, she finds it helpful when she takes it early on.  When she was on Aimovig she hardly had to take the Imitrex.  Unfortunately, while the Imitrex is very effective in aborting her migraine, she has sleepiness from it.  She does not like to take it. I reviewed your office note from 07/17/2019, which you kindly included.  She has been on generic Imitrex 100 mg strength as needed.  For migraine prevention, she has been on Topamax which was recently discontinued, prior to that she had tried verapamil.  She was given samples for Aimovig at her appointment with your office recently.  She denies any one-sided weakness or numbness or tingling or droopy face or slurring of speech.   Addendum, 07/30/2019: I reviewed patient's laboratory test results from 07/14/2019: CBC with differential was unremarkable, CMP showed glucose level of 139, BUN 19, creatinine 0.96,  otherwise unremarkable, lipid panel showed total cholesterol of 154, triglycerides 261, LDL 67, A1c was 7.4, TSH 2.3.  Her Past Medical History Is Significant For: Past Medical History:  Diagnosis Date  . Arthritis   . 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 Diabetes mellitus without complication ([place])   . Frequent UTI     h/o since hysterectomy/04/01/13 asymptomatic  .  GERD (gastroesophageal reflux disease)   . Headache(784.0)    migraines  . Hypertension     Her Past Surgical History Is Significant For: Past Surgical History:  Procedure Laterality Date  . ABDOMINAL HYSTERECTOMY    . CHOLECYSTECTOMY    . ENTEROCELE REPAIR    . KNEE ARTHROSCOPY Right   . KNEE ARTHROSCOPY Right 07/01/2014   Procedure: RIGHT ARTHROSCOPY KNEE WITH SYNOVECTOMY;  Surgeon: [name], MD;  Location: [facility];  Service: Orthopedics;  Laterality: Right;  . RECTOCELE REPAIR    . TONSILLECTOMY    . TOTAL KNEE ARTHROPLASTY Right 04/07/2013   Procedure: RIGHT TOTAL KNEE ARTHROPLASTY;  Surgeon: [name], MD;  Location: [facility];  Service: Orthopedics;  Laterality: Right;    Her Family History Is Significant For: History reviewed. No pertinent family history.  Her Social History Is Significant For: Social History   Socioeconomic History  . Marital status: Married    Spouse name: Not on file  . Number of children: Not on file  . Years of education: Not on file  . Highest education level: Not on file  Occupational History  . Not on file  Social Needs  . Financial resource strain: Not on file  . Food insecurity    Worry: Not on file    Inability: Not on file  . Transportation needs    Medical: Not on file    Non-medical: Not on file  Tobacco Use  . Smoking status: Never Smoker  . Smokeless tobacco: Never Used  Substance and Sexual Activity  . Alcohol use: No  . Drug use: No  . Sexual activity: Not on file  Lifestyle  . Physical activity    Days per week: Not on file    Minutes per session: Not on file  . Stress: Not on file  Relationships  . Social Herbalist on phone: Not on file    Gets together: Not on file    Attends religious service: Not on file    Active member of club or organization: Not on file    Attends meetings of clubs or organizations: Not on file    Relationship status: Not on file  Other Topics Concern  . Not on file   Social History Narrative  . Not on file    Her Allergies Are:  Allergies  Allergen Reactions  . Amoxicillin   . Hydrocodone Other (See Comments)    Achy all over   . Erythromycin Hives and Rash  . Sulfa Antibiotics Rash    fever  . Tramadol Nausea Only and Palpitations  :   Her Current Medications Are:  Outpatient Encounter Medications as of 07/29/2019  Medication Sig  . aspirin EC 81 MG tablet Take 81 mg by mouth every morning.  . Calcium Carb-Cholecalciferol (CALCIUM 600 + D PO) Take 1 tablet by mouth at bedtime.  Marland Kitchen escitalopram (LEXAPRO) 10 MG tablet Take 10 mg by mouth daily.  Marland Kitchen ibuprofen (ADVIL,MOTRIN) 200 MG tablet Take 400 mg by mouth every 8 (eight) hours as needed for mild pain.  Marland Kitchen icosapent  Ethyl (VASCEPA) 1 g capsule Take 2 g by mouth 2 (two) times daily.  Marland Kitchen lisinopril-hydrochlorothiazide (PRINZIDE,ZESTORETIC) 20-25 MG per tablet Take 1 tablet by mouth at bedtime.  Marland Kitchen LORazepam (ATIVAN) 0.5 MG tablet Take 0.5 mg by mouth daily as needed for anxiety.  . metFORMIN (GLUCOPHAGE) 500 MG tablet Take 500 mg by mouth 2 (two) times daily with a meal.  . omeprazole (PRILOSEC) 20 MG capsule Take 20 mg by mouth every morning.   . pravastatin (PRAVACHOL) 40 MG tablet Take 40 mg by mouth at bedtime.  . Semaglutide (RYBELSUS) 3 MG TABS Take 3 mg by mouth daily.  . SUMAtriptan (IMITREX) 100 MG tablet Take 100 mg by mouth every 2 (two) hours as needed for migraine.  Marland Kitchen zolpidem (AMBIEN) 10 MG tablet Take 5-10 mg by mouth at bedtime as needed for sleep.  Eduard Roux (AIMOVIG) 140 MG/ML SOAJ Inject 140 mg into the skin every 30 (thirty) days.  . [DISCONTINUED] HYDROmorphone (DILAUDID) 2 MG tablet Take 1-2 tablets (2-4 mg total) by mouth every 4 (four) hours as needed for severe pain.  . [DISCONTINUED] methocarbamol (ROBAXIN) 500 MG tablet Take 1 tablet (500 mg total) by mouth 4 (four) times daily. As needed for muscle spasm  . [DISCONTINUED] Multiple Vitamin (MULTIVITAMIN WITH  MINERALS) TABS tablet Take 1 tablet by mouth every morning.  . [DISCONTINUED] Omega-3 Fatty Acids (FISH OIL) 1200 MG CAPS Take 1 capsule by mouth 2 (two) times daily.  . [DISCONTINUED] verapamil (CALAN-SR) 240 MG CR tablet Take 240 mg by mouth daily with breakfast.    No facility-administered encounter medications on file as of 07/29/2019.   :  Review of Systems:  Out of a complete 14 point review of systems, all are reviewed and negative with the exception of these symptoms as listed below:  Review of Systems  Neurological:       Pt presents today to discuss her migraines. Pt gets 2-3 migraines at least per week that last over 4 hours. She has associated N/V, light and sound sensitivity. She has tried and failed topamax, verapamil, and imitrex. Pt was started on aimovig and it eliminated her migraines but her insurance no longer covers it and wants her to consult with neurology. Pt has not had any head imaging. Pt has never had a sleep study and does not endorse snoring.  Epworth Sleepiness Scale 0= would never doze 1= slight chance of dozing 2= moderate chance of dozing 3= high chance of dozing  Sitting and reading: 1 Watching TV: 1 Sitting inactive in a public place (ex. Theater or meeting): 0 As a passenger in a car for an hour without a break: 0 Lying down to rest in the afternoon: 1 Sitting and talking to someone: 0 Sitting quietly after lunch (no alcohol): 0 In a car, while stopped in traffic: 0 Total: 3     Objective:  Neurological Exam  Physical Exam Physical Examination:   Vitals:   07/29/19 1515  BP: (!) 166/101  Pulse: 62    General Examination: The patient is a very pleasant 63 y.o. female in no acute distress. She appears well-developed and well-nourished and well groomed.   HEENT: Normocephalic, atraumatic, pupils are equal, round and reactive to light and accommodation. Funduscopic exam is normal with sharp disc margins noted. Extraocular tracking is good  without limitation to gaze excursion or nystagmus noted. Normal smooth pursuit is noted. Hearing is grossly intact. Face is symmetric with normal facial animation and normal facial sensation. Speech is  clear with no dysarthria noted. There is no hypophonia. There is no lip, neck/head, jaw or voice tremor. Neck is supple with full range of passive and active motion. There are no carotid bruits on auscultation. Oropharynx exam reveals: moderate mouth dryness, adequate dental hygiene and mild airway crowding. Tongue protrudes centrally and palate elevates symmetrically.   Chest: Clear to auscultation without wheezing, rhonchi or crackles noted.  Heart: S1+S2+0, regular and normal without murmurs, rubs or gallops noted.   Abdomen: Soft, non-tender and non-distended with normal bowel sounds appreciated on auscultation.  Extremities: There is no pitting edema in the distal lower extremities bilaterally. Pedal pulses are intact.  Skin: Warm and dry without trophic changes noted.  Musculoskeletal: exam reveals no obvious joint deformities, tenderness or joint swelling or erythema.   Neurologically:  Mental status: The patient is awake, alert and oriented in all 4 spheres. Her immediate and remote memory, attention, language skills and fund of knowledge are appropriate. There is no evidence of aphasia, agnosia, apraxia or anomia. Speech is clear with normal prosody and enunciation. Thought process is linear. Mood is normal and affect is normal.  Cranial nerves II - XII are as described above under HEENT exam. In addition: shoulder shrug is normal with equal shoulder height noted. Motor exam: Normal bulk, strength and tone is noted. There is no drift, tremor or rebound. Romberg is negative. Reflexes are 2+ throughout. Babinski: Toes are flexor bilaterally. Fine motor skills and coordination: intact with normal finger taps, normal hand movements, normal rapid alternating patting, normal foot taps and normal  foot agility.  Cerebellar testing: No dysmetria or intention tremor on finger to nose testing. Heel to shin is unremarkable bilaterally. There is no truncal or gait ataxia.  Sensory exam: intact to light touch, vibration, temperature sense in the upper and lower extremities.  Gait, station and balance: She stands easily. No veering to one side is noted. No leaning to one side is noted. Posture is age-appropriate and stance is narrow based. Gait shows normal stride length and normal pace. No problems turning are noted.   Assessment and Plan:   In summary, Lindsey [name] is a very pleasant 63 y.o.-year old female with an underlying medical history of arthritis, status post right total knee replacement in 2014, hypertension, reflux disease, anxiety, hyperlipidemia, reflux disease, recurrent UTI, diabetes, and borderline obesity, who Presents for evaluation of her headaches.  Her history is in keeping with intractable migraines without aura, without status migrainosus.  She has tried and failed multiple preventative medications including SSRI and SNRI type antidepressants including sertraline, Celexa, Effexor and also recalls trying Elavil.  She has tried verapamil in the past and more recently Topamax, most of these medications cause side effects including increase in her migraines even.  She had very good results with Aimovig for about a year and unfortunately was not able to fill it any longer because of her insurance.  We will resubmit to her insurance as she has a Good reason to use one of the newer injectable medications, she has had multiple traditional preventative medication trials and has a history of medication sensitivities.  Neurological exam is nonfocal, nevertheless, since she has never had a brain MRI I recommend that we have at least a baseline brain MRI on file.  I will order it. She is agreeable, but Would prefer to have it in [place].  She can continue with as needed use of Imitrex, her  prescription is up-to-date.  She may even do well  with one of the newer oral abortive medication such as Ubrelvy or Nurtec. I offered him prescription for as needed use of Zofran for nausea but she declined it.  She tries to get by with the least amount of medication.  She is encouraged to stay well-hydrated and well rested, history is not suggestive of any sleep apnea. We will plan to see her back routinely in 3 months, sooner if needed.  We will submit for insurance authorization for her Aimovig today.  I answered all her questions today and she was in agreement. Thank you very much for allowing me to participate in the care of this nice patient. If I can be of any further assistance to you please do not hesitate to call me at [phone].  Sincerely,   Star Age, MD, PhD

## 2019-07-29 NOTE — Telephone Encounter (Signed)
I completed PA for aimovig via covermymeds. Sent to [place].  Key: [id number].  PA was approved. [facility] notified.  Request Reference Number: [id number]. AIMOVIG INJ 140MG /ML is approved through 01/26/2020. For further questions, call [phone].

## 2019-07-29 NOTE — Patient Instructions (Addendum)
You have tried and failed multiple preventative medications for your longstanding history of migraines.  You had good success with Aimovig which is good to know, I would like for [place] to try to prescribe it again.  If needed, I would be happy to write an appeal letter to your insurance.  Since you have never had a brain MRI, I would like to order a brain MRI with and without contrast.  We will call your primary care physician's office regarding your recent blood test results as you have to have normal kidney function to be able to take an MRI with contrast. Your neurological exam is normal thankfully. We may be able to consider Botox injections if needed down the road for migraine prevention.

## 2019-07-30 NOTE — Telephone Encounter (Signed)
I called pt. I advised her that aimovig was approved by insurance. She may not be able to get aimovig thru [facility] and may need to use a larger retail pharmacy. She will let us know. Pt verbalized understanding and appreciation.

## 2019-08-11 ENCOUNTER — Telehealth: Payer: Self-pay | Admitting: Neurology

## 2019-08-11 NOTE — Telephone Encounter (Signed)
See Dr. Guadelupe Sabin office note: She reviewed pt's labs and creatinine was included: "07/30/2019: I reviewed patient's laboratory test results from 07/14/2019: CBC with differential was unremarkable, CMP showed glucose level of 139, BUN 19, creatinine 0.96, otherwise unremarkable, lipid panel showed total cholesterol of 154, triglycerides 261, LDL 67, A1c was 7.4, TSH 2.3."

## 2019-08-11 NOTE — Telephone Encounter (Signed)
Patient is scheduled for her MRI 08/13/2019 arrive at 10:15 am .  Dr. Rexene Alberts will you order Creatine labs  Please ? Thanks Hinton Dyer

## 2019-08-12 NOTE — Telephone Encounter (Signed)
[place] called to get orders faxed over with lab orders. Please follow up.  [phone] CB#[phone] dept

## 2019-08-12 NOTE — Telephone Encounter (Signed)
Dinkins, Cyril Mourning, RN to Me     11:24 AM Note   See Dr. Guadelupe Sabin office note: She reviewed pt's labs and creatinine was included: "07/30/2019:I reviewed patient's laboratory test results from 07/14/2019: CBC with differential was unremarkable, CMP showed glucose level of 139, BUN 19, creatinine 0.96, otherwise unremarkable, lipid panel showed total cholesterol of 154, triglycerides 261, LDL 67, A1c was 7.4, TSH 2.3."

## 2019-08-12 NOTE — Telephone Encounter (Signed)
Lindsey [name] will you also fax her creatine labs with the orders Dr. Rexene Alberts signed for MRI . Patient's apt 08/13/2019 Thanks Hinton Dyer

## 2019-08-12 NOTE — Telephone Encounter (Signed)
I faxed the information below to the number provided and the MRI order. DW

## 2019-08-13 ENCOUNTER — Encounter: Payer: Self-pay | Admitting: Neurology

## 2019-08-13 DIAGNOSIS — G43909 Migraine, unspecified, not intractable, without status migrainosus: Secondary | ICD-10-CM | POA: Diagnosis not present

## 2019-08-13 DIAGNOSIS — G43019 Migraine without aura, intractable, without status migrainosus: Secondary | ICD-10-CM | POA: Diagnosis not present

## 2019-08-14 ENCOUNTER — Telehealth: Payer: Self-pay

## 2019-08-14 NOTE — Telephone Encounter (Signed)
Received MRI results from [facility]. Will give to Dr. Rexene Alberts for review.

## 2019-08-18 NOTE — Telephone Encounter (Signed)
I received patient's brain MRI report, she had a brain MRI without contrast through [facility] in [place] on August 11, 2019: Impression: No cause of headache identified.  Normal study for age, with a few punctate foci of T2 and flair signal in the hemispheric white matter, often seen at this age.  Please call patient and advise her that her recent brain MRI without contrast showed no acute findings and overall showed age-appropriate findings.

## 2019-08-19 NOTE — Telephone Encounter (Signed)
I called pt and advised her of the MRI results. Pt verbalized understanding of results. Pt had no questions at this time but was encouraged to call back if questions arise.

## 2019-10-13 ENCOUNTER — Other Ambulatory Visit: Payer: Self-pay

## 2019-10-13 DIAGNOSIS — I10 Essential (primary) hypertension: Secondary | ICD-10-CM

## 2019-10-13 DIAGNOSIS — E782 Mixed hyperlipidemia: Secondary | ICD-10-CM

## 2019-10-13 DIAGNOSIS — E1142 Type 2 diabetes mellitus with diabetic polyneuropathy: Secondary | ICD-10-CM

## 2019-10-14 ENCOUNTER — Ambulatory Visit: Payer: BLUE CROSS/BLUE SHIELD

## 2019-10-15 ENCOUNTER — Other Ambulatory Visit: Payer: Self-pay

## 2019-10-15 ENCOUNTER — Ambulatory Visit: Payer: BC Managed Care – PPO

## 2019-10-16 LAB — CBC WITH DIFFERENTIAL/PLATELET
Basophils Absolute: 0 10*3/uL (ref 0.0–0.2)
Basos: 1 %
EOS (ABSOLUTE): 0.1 10*3/uL (ref 0.0–0.4)
Eos: 2 %
Hematocrit: 42.1 % (ref 34.0–46.6)
Hemoglobin: 14 g/dL (ref 11.1–15.9)
Immature Grans (Abs): 0 10*3/uL (ref 0.0–0.1)
Immature Granulocytes: 0 %
Lymphocytes Absolute: 1.6 10*3/uL (ref 0.7–3.1)
Lymphs: 28 %
MCH: 28.5 pg (ref 26.6–33.0)
MCHC: 33.3 g/dL (ref 31.5–35.7)
MCV: 86 fL (ref 79–97)
Monocytes Absolute: 0.5 10*3/uL (ref 0.1–0.9)
Monocytes: 8 %
Neutrophils Absolute: 3.4 10*3/uL (ref 1.4–7.0)
Neutrophils: 61 %
Platelets: 448 10*3/uL (ref 150–450)
RBC: 4.91 x10E6/uL (ref 3.77–5.28)
RDW: 13.5 % (ref 11.7–15.4)
WBC: 5.6 10*3/uL (ref 3.4–10.8)

## 2019-10-16 LAB — LIPID PANEL
Chol/HDL Ratio: 2.9 ratio (ref 0.0–4.4)
Cholesterol, Total: 132 mg/dL (ref 100–199)
HDL: 46 mg/dL (ref >39)
LDL Chol Calc (NIH): 60 mg/dL (ref 0–99)
Triglycerides: 150 mg/dL — ABNORMAL HIGH (ref 0–149)
VLDL Cholesterol Cal: 26 mg/dL (ref 5–40)

## 2019-10-16 LAB — COMPREHENSIVE METABOLIC PANEL
ALT: 22 IU/L (ref 0–32)
AST: 20 IU/L (ref 0–40)
Albumin/Globulin Ratio: 1.8 (ref 1.2–2.2)
Albumin: 4.6 g/dL (ref 3.8–4.8)
Alkaline Phosphatase: 74 IU/L (ref 39–117)
BUN/Creatinine Ratio: 18 (ref 12–28)
BUN: 18 mg/dL (ref 8–27)
Bilirubin Total: 0.5 mg/dL (ref 0.0–1.2)
CO2: 22 mmol/L (ref 20–29)
Calcium: 10.1 mg/dL (ref 8.7–10.3)
Chloride: 99 mmol/L (ref 96–106)
Creatinine, Ser: 0.98 mg/dL (ref 0.57–1.00)
GFR calc Af Amer: 71 mL/min/{1.73_m2} (ref >59)
GFR calc non Af Amer: 62 mL/min/{1.73_m2} (ref >59)
Globulin, Total: 2.6 g/dL (ref 1.5–4.5)
Glucose: 133 mg/dL — ABNORMAL HIGH (ref 65–99)
Potassium: 4.8 mmol/L (ref 3.5–5.2)
Sodium: 138 mmol/L (ref 134–144)
Total Protein: 7.2 g/dL (ref 6.0–8.5)

## 2019-10-16 LAB — HEMOGLOBIN A1C
Est. average glucose Bld gHb Est-mCnc: 154 mg/dL
Hgb A1c MFr Bld: 7 % — ABNORMAL HIGH (ref 4.8–5.6)

## 2019-10-16 LAB — CARDIOVASCULAR RISK ASSESSMENT

## 2019-10-20 ENCOUNTER — Telehealth: Payer: Self-pay | Admitting: Neurology

## 2019-10-20 ENCOUNTER — Other Ambulatory Visit: Payer: Self-pay

## 2019-10-20 ENCOUNTER — Ambulatory Visit: Payer: BC Managed Care – PPO | Admitting: Family Medicine

## 2019-10-20 ENCOUNTER — Other Ambulatory Visit: Payer: Self-pay | Admitting: Physician Assistant

## 2019-10-20 ENCOUNTER — Other Ambulatory Visit: Payer: Self-pay | Admitting: Family Medicine

## 2019-10-20 VITALS — BP 132/78 | HR 64 | Temp 96.4°F | Resp 16 | Ht 64.0 in | Wt 179.4 lb

## 2019-10-20 DIAGNOSIS — I10 Essential (primary) hypertension: Secondary | ICD-10-CM | POA: Insufficient documentation

## 2019-10-20 DIAGNOSIS — G43009 Migraine without aura, not intractable, without status migrainosus: Secondary | ICD-10-CM

## 2019-10-20 DIAGNOSIS — E1169 Type 2 diabetes mellitus with other specified complication: Secondary | ICD-10-CM | POA: Insufficient documentation

## 2019-10-20 DIAGNOSIS — E782 Mixed hyperlipidemia: Secondary | ICD-10-CM | POA: Diagnosis not present

## 2019-10-20 DIAGNOSIS — Z1231 Encounter for screening mammogram for malignant neoplasm of breast: Secondary | ICD-10-CM

## 2019-10-20 DIAGNOSIS — Z6830 Body mass index (BMI) 30.0-30.9, adult: Secondary | ICD-10-CM

## 2019-10-20 DIAGNOSIS — K219 Gastro-esophageal reflux disease without esophagitis: Secondary | ICD-10-CM | POA: Diagnosis not present

## 2019-10-20 DIAGNOSIS — E785 Hyperlipidemia, unspecified: Secondary | ICD-10-CM

## 2019-10-20 NOTE — Telephone Encounter (Signed)
Your rx 

## 2019-10-20 NOTE — Patient Instructions (Signed)
Migraine without aura and without status migrainosus, not intractable Well controlled.  No changes to medicines.  Continue to work on eating a healthy diet and exercise.  Labs reviewed.   Gastroesophageal reflux disease without esophagitis Well controlled.  No changes to medicines. omperazole. Continue to work on eating a healthy diet and exercise.    Dyslipidemia associated with type 2 diabetes mellitus ([place]) Well controlled. Improved.  No changes to medicines.  Continue to work on eating a healthy diet and exercise.  Labs reviewed.    Visit for screening mammogram Order mammogram.  Mixed hyperlipidemia Well controlled.  No changes to medicines.  Continue to work on eating a healthy diet and exercise.  Labs reviewed.

## 2019-10-20 NOTE — Assessment & Plan Note (Signed)
Well controlled.  °No changes to medicines.  °Continue to work on eating a healthy diet and exercise.  °Labs reviewed.  °

## 2019-10-20 NOTE — Assessment & Plan Note (Addendum)
Well controlled. Improved.  No changes to medicines.  Continue to work on eating a healthy diet and exercise.  Labs reviewed.

## 2019-10-20 NOTE — Telephone Encounter (Signed)
I called patient per RN request regarding rescheduling 2/24 appointment. Patient agreed to move her appt later that afternoon, however, she has concerns of whether she needs this appointment or not. Patient states that between our office and her PCP, she has tolerated Aimovig very well with no issues for a year. She believes that she does not need to come in since she is doing well on this medication. Patient agreed to doing a MyChart visit at 2pm on 2/24 if Dr. Rexene Alberts wants her to keep this appointment, because travel was also a concern for her. Please advise.

## 2019-10-20 NOTE — Progress Notes (Signed)
Subjective:  Patient ID: Lindsey [name], female    DOB: 07-11-1956  Age: 64 y.o. MRN: [id number]  Chief Complaint  Patient presents with  . Hyperlipidemia  . Diabetes  . Hypertension    HPI  The patient presents with history of (type 2) diabetes mellitus without complications. Patient was diagnosed in 2010. Compliance with treatment has been good; the patient takes medication as directed , maintains a diabetic diet (56 day diet - restarted and has lost 10 lbs.) and an exercise regimen, follows up as directed, and is keeping a glucose diary. Sugars run 140s. Patient specifically denies associated symptoms, including blurred vision, fatigue, polydipsia, polyphagia and polyuria . Patient denies hypoglycemia. In regard to preventative care, the patient performs foot self-exams daily and last ophthalmology exam was in 2020. Patient is currently on metformin 500 g one twice a day. Mixed hyperlipidemia  Pt presents with hyperlipidemia. Patient was diagnosed in 2010.  Compliance with treatment has been good; The patient is compliant with medications, maintains a low cholesterol diet, follows up as directed, and maintains an exercise regimen. The patient denies experiencing any hypercholesterolemia related symptoms. Patient is currently on pravastin.  Hypertension: Patient here for follow-up of hypertension. Cardiovascular risk factors: diabetes mellitus and dyslipidemia. Use of agents associated with hypertension: lisinopril/hctz. History of target organ damage: none.    Social Hx   Social History   Socioeconomic History  . Marital status: Married    Spouse name: Not on file  . Number of children: Not on file  . Years of education: Not on file  . Highest education level: Not on file  Occupational History  . Not on file  Tobacco Use  . Smoking status: Never Smoker  . Smokeless tobacco: Never Used  Substance and Sexual Activity  . Alcohol use: No  . Drug use: No  . Sexual activity: Not on  file  Other Topics Concern  . Not on file  Social History Narrative  . Not on file   Social Determinants of Health   Financial Resource Strain:   . Difficulty of Paying Living Expenses: Not on file  Food Insecurity:   . Worried About Charity fundraiser in the Last Year: Not on file  . Ran Out of Food in the Last Year: Not on file  Transportation Needs:   . Lack of Transportation (Medical): Not on file  . Lack of Transportation (Non-Medical): Not on file  Physical Activity:   . Days of Exercise per Week: Not on file  . Minutes of Exercise per Session: Not on file  Stress:   . Feeling of Stress : Not on file  Social Connections:   . Frequency of Communication with Friends and Family: Not on file  . Frequency of Social Gatherings with Friends and Family: Not on file  . Attends Religious Services: Not on file  . Active Member of Clubs or Organizations: Not on file  . Attends Archivist Meetings: Not on file  . Marital Status: Not on file   Past Medical History:  Diagnosis Date  . Arthritis   . 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 Diabetes mellitus without complication ([place])   . Frequent UTI     h/o since hysterectomy/04/01/13 asymptomatic  . GERD (gastroesophageal reflux disease)   . Headache(784.0)    migraines  . Hypertension    The patient's family history was reviewed and unchanged.   Review of Systems  Constitutional: Negative for chills, fatigue and  fever.  HENT: Negative for congestion, ear pain and sore throat.   Respiratory: Negative for cough and shortness of breath.   Cardiovascular: Negative for chest pain.  Gastrointestinal: Negative for abdominal pain, constipation, diarrhea, nausea and vomiting.  Endocrine: Negative for polydipsia, polyphagia and polyuria.  Genitourinary: Negative for dysuria and urgency.  Musculoskeletal: Negative for arthralgias and myalgias.  Neurological: Positive for headaches. Negative for dizziness.       Migraines  well controlled on Aimovig. Saw neurology who performed a ct scan of brain. Per patient report was normal.   Psychiatric/Behavioral: Negative for dysphoric mood. The patient is not nervous/anxious.      Objective:  BP 132/78 (BP Location: Left Arm, Patient Position: Sitting, Cuff Size: Normal)   Pulse 64   Temp (!) 96.4 F (35.8 C)   Resp 16   Ht 5\' 4"  (1.626 m)   Wt 179 lb 6.4 oz (81.4 kg)   BMI 30.79 kg/m   BP/Weight 10/20/2019 07/29/2019 [id number]  Systolic BP Q000111Q XX123456 [id number]  Diastolic BP 78 99991111 66  Wt. (Lbs) 179.4 186 195  BMI 30.79 30.95 32.45    Physical Exam Vitals reviewed.  Constitutional:      General: She is not in acute distress.    Appearance: Normal appearance. She is obese.  HENT:     Right Ear: Tympanic membrane and ear canal normal.     Left Ear: Tympanic membrane and ear canal normal.     Nose: Nose normal. No congestion or rhinorrhea.  Eyes:     Conjunctiva/sclera: Conjunctivae normal.  Neck:     Thyroid: No thyroid mass.  Cardiovascular:     Rate and Rhythm: Normal rate and regular rhythm.     Pulses: Normal pulses.     Heart sounds: No murmur.  Pulmonary:     Effort: Pulmonary effort is normal.     Breath sounds: Normal breath sounds.  Abdominal:     General: Bowel sounds are normal.     Palpations: Abdomen is soft. There is no mass.     Tenderness: There is no abdominal tenderness.  Musculoskeletal:        General: Normal range of motion.  Lymphadenopathy:     Cervical: No cervical adenopathy.  Skin:    General: Skin is warm and dry.  Neurological:     Mental Status: She is alert and oriented to person, place, and time.     Cranial Nerves: No cranial nerve deficit.  Psychiatric:        Mood and Affect: Mood normal.        Behavior: Behavior normal.     Lab Results  Component Value Date   WBC 5.6 10/15/2019   HGB 14.0 10/15/2019   HCT 42.1 10/15/2019   PLT 448 10/15/2019   GLUCOSE 133 (H) 10/15/2019   CHOL 132 10/15/2019   TRIG  150 (H) 10/15/2019   HDL 46 10/15/2019   LDLCALC 60 10/15/2019   ALT 22 10/15/2019   AST 20 10/15/2019   NA 138 10/15/2019   K 4.8 10/15/2019   CL 99 10/15/2019   CREATININE 0.98 10/15/2019   BUN 18 10/15/2019   CO2 22 10/15/2019   INR 0.92 04/01/2013   HGBA1C 7.0 (H) 10/15/2019    Assessment & Plan:   1. Dyslipidemia associated with type 2 diabetes mellitus ([place]) 2. Mixed hyperlipidemia 3. Essential hypertension, benign 4. Gastroesophageal reflux disease without esophagitis 5. Visit for screening mammogram 6. Migraine without aura and without status migrainosus, not  intractable 7. BMI 30.0-30.9,adult  AN INDIVIDUALIZED CARE PLAN: was established or reinforced today.   The patient's chronic medical issues are well controlled.  Continue current medications. Continue 56 day diet as it is working well for her. Continue regular exercise. Labs reviewed.    Return in about 3 months (around 01/17/2020).  A CLINICAL SUMMARY including a written plan identify barriers to care unique to individual due to social or financial issues and help create solutions together. and a patient's and the patient's families understanding of their medical issues and care needs   [place] [phone]

## 2019-10-20 NOTE — Telephone Encounter (Signed)
Please call patient back.  Her appointment can be moved out to May or later if she likes. I am glad to hear that she has done well with the Aimovig injections.  She also had a benign MRI brain in December 2020.  Please offer her a follow-up appointment down the road.

## 2019-10-20 NOTE — Assessment & Plan Note (Signed)
Order mammogram °

## 2019-10-20 NOTE — Telephone Encounter (Signed)
I have reached out to the pt and she was agreeable to pushing appt futher out.  I have scheduled for 04/20/2020.  Pt will call if her migraines change.

## 2019-10-20 NOTE — Assessment & Plan Note (Signed)
Well controlled.  No changes to medicines. omperazole. Continue to work on eating a healthy diet and exercise.

## 2019-10-26 ENCOUNTER — Encounter: Payer: Self-pay | Admitting: Family Medicine

## 2019-10-26 DIAGNOSIS — Z6830 Body mass index (BMI) 30.0-30.9, adult: Secondary | ICD-10-CM | POA: Insufficient documentation

## 2019-10-29 ENCOUNTER — Telehealth: Payer: BC Managed Care – PPO | Admitting: Neurology

## 2019-10-29 ENCOUNTER — Ambulatory Visit: Payer: BC Managed Care – PPO | Admitting: Neurology

## 2019-11-21 ENCOUNTER — Other Ambulatory Visit: Payer: Self-pay | Admitting: Family Medicine

## 2019-12-30 ENCOUNTER — Telehealth: Payer: Self-pay

## 2019-12-30 NOTE — Telephone Encounter (Signed)
PA for Aimovig has been submitted to cover my meds. (Key: [id number]) Waiting on response.

## 2019-12-31 NOTE — Telephone Encounter (Signed)
PA approved This request has received a Favorable outcome.  Please note any additional information provided by [facility] at the bottom of your screen.  You will also receive a faxed copy of the determination. PA valid from 12/30/19-12/29/20.

## 2020-01-13 ENCOUNTER — Other Ambulatory Visit: Payer: BC Managed Care – PPO

## 2020-01-13 ENCOUNTER — Other Ambulatory Visit: Payer: Self-pay | Admitting: Family Medicine

## 2020-01-13 ENCOUNTER — Other Ambulatory Visit: Payer: Self-pay

## 2020-01-13 DIAGNOSIS — Z1231 Encounter for screening mammogram for malignant neoplasm of breast: Secondary | ICD-10-CM | POA: Diagnosis not present

## 2020-01-13 DIAGNOSIS — E782 Mixed hyperlipidemia: Secondary | ICD-10-CM

## 2020-01-13 DIAGNOSIS — E1169 Type 2 diabetes mellitus with other specified complication: Secondary | ICD-10-CM

## 2020-01-13 DIAGNOSIS — I10 Essential (primary) hypertension: Secondary | ICD-10-CM

## 2020-01-13 DIAGNOSIS — E785 Hyperlipidemia, unspecified: Secondary | ICD-10-CM

## 2020-01-14 ENCOUNTER — Other Ambulatory Visit: Payer: Self-pay | Admitting: Family Medicine

## 2020-01-14 LAB — CBC WITH DIFFERENTIAL/PLATELET
Basophils Absolute: 0.1 10*3/uL (ref 0.0–0.2)
Basos: 1 %
EOS (ABSOLUTE): 0.1 10*3/uL (ref 0.0–0.4)
Eos: 2 %
Hematocrit: 43.1 % (ref 34.0–46.6)
Hemoglobin: 14.2 g/dL (ref 11.1–15.9)
Immature Grans (Abs): 0 10*3/uL (ref 0.0–0.1)
Immature Granulocytes: 0 %
Lymphocytes Absolute: 1.7 10*3/uL (ref 0.7–3.1)
Lymphs: 28 %
MCH: 28.1 pg (ref 26.6–33.0)
MCHC: 32.9 g/dL (ref 31.5–35.7)
MCV: 85 fL (ref 79–97)
Monocytes Absolute: 0.5 10*3/uL (ref 0.1–0.9)
Monocytes: 8 %
Neutrophils Absolute: 3.7 10*3/uL (ref 1.4–7.0)
Neutrophils: 61 %
Platelets: 443 10*3/uL (ref 150–450)
RBC: 5.06 x10E6/uL (ref 3.77–5.28)
RDW: 13.5 % (ref 11.7–15.4)
WBC: 6.1 10*3/uL (ref 3.4–10.8)

## 2020-01-14 LAB — COMPREHENSIVE METABOLIC PANEL
ALT: 18 IU/L (ref 0–32)
AST: 21 IU/L (ref 0–40)
Albumin/Globulin Ratio: 1.9 (ref 1.2–2.2)
Albumin: 4.7 g/dL (ref 3.8–4.8)
Alkaline Phosphatase: 83 IU/L (ref 39–117)
BUN/Creatinine Ratio: 18 (ref 12–28)
BUN: 16 mg/dL (ref 8–27)
Bilirubin Total: 0.5 mg/dL (ref 0.0–1.2)
CO2: 25 mmol/L (ref 20–29)
Calcium: 10.3 mg/dL (ref 8.7–10.3)
Chloride: 97 mmol/L (ref 96–106)
Creatinine, Ser: 0.89 mg/dL (ref 0.57–1.00)
GFR calc Af Amer: 80 mL/min/{1.73_m2} (ref >59)
GFR calc non Af Amer: 70 mL/min/{1.73_m2} (ref >59)
Globulin, Total: 2.5 g/dL (ref 1.5–4.5)
Glucose: 127 mg/dL — ABNORMAL HIGH (ref 65–99)
Potassium: 4.7 mmol/L (ref 3.5–5.2)
Sodium: 137 mmol/L (ref 134–144)
Total Protein: 7.2 g/dL (ref 6.0–8.5)

## 2020-01-14 LAB — HEMOGLOBIN A1C
Est. average glucose Bld gHb Est-mCnc: 163 mg/dL
Hgb A1c MFr Bld: 7.3 % — ABNORMAL HIGH (ref 4.8–5.6)

## 2020-01-14 LAB — LIPID PANEL
Chol/HDL Ratio: 3.1 ratio (ref 0.0–4.4)
Cholesterol, Total: 161 mg/dL (ref 100–199)
HDL: 52 mg/dL (ref >39)
LDL Chol Calc (NIH): 76 mg/dL (ref 0–99)
Triglycerides: 200 mg/dL — ABNORMAL HIGH (ref 0–149)
VLDL Cholesterol Cal: 33 mg/dL (ref 5–40)

## 2020-01-14 LAB — CARDIOVASCULAR RISK ASSESSMENT

## 2020-01-14 NOTE — Progress Notes (Signed)
Subjective:  Patient ID: Lindsey [name], female    DOB: 1956-08-10  Age: 64 y.o. MRN: [id number]  Chief Complaint  Patient presents with  . Diabetes  . Hypertension    HPI Patient is a 64 year old white female who presents for follow-up of diabetes, hypertension, hyperlipidemia, and migraines.  Currently her sugars are running 1 30-1 50.  She checks her feet daily.  She has been eating healthy for the most part although she feels she can do better.  She is not exercising.  She is compliant with taking her medications.  Her eye exam is set up for July 2021.  Patient is currently taking pravastatin for her cholesterol.  She takes lisinopril hydrochlorothiazide for her blood pressure.  For diabetes she is currently taking Metformin. For her migraines she takes Aimovig once a month and Imitrex as needed.  She has had some increase in her migraines she feels like due to her neck spasms.  She is requesting some treatment for the neck spasm she gets.  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  Patient is also concerned she has a bladder infection.  She is having dysuria and polyuria for the last 24 hours.  In the past when she occasionally gets the Cipro usually helps.  Past Medical History:  Diagnosis Date  . Arthritis   . 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 Diabetes mellitus without complication ([place])   . Frequent UTI     h/o since hysterectomy/04/01/13 asymptomatic  . GERD (gastroesophageal reflux disease)   . Headache(784.0)    migraines  . Hypertension    Past Surgical History:  Procedure Laterality Date  . ABDOMINAL HYSTERECTOMY    . CHOLECYSTECTOMY    . ENTEROCELE REPAIR    . KNEE ARTHROSCOPY Right   . KNEE ARTHROSCOPY Right 07/01/2014   Procedure: RIGHT ARTHROSCOPY KNEE WITH SYNOVECTOMY;  Surgeon: [name], MD;  Location: [facility];  Service: Orthopedics;  Laterality: Right;  . RECTOCELE REPAIR    . TONSILLECTOMY    . TOTAL  KNEE ARTHROPLASTY Right 04/07/2013   Procedure: RIGHT TOTAL KNEE ARTHROPLASTY;  Surgeon: [name], MD;  Location: [facility];  Service: Orthopedics;  Laterality: Right;    History reviewed. No pertinent family history. Social History   Socioeconomic History  . Marital status: Married    Spouse name: Not on file  . Number of children: Not on file  . Years of education: Not on file  . Highest education level: Not on file  Occupational History  . Not on file  Tobacco Use  . Smoking status: Never Smoker  . Smokeless tobacco: Never Used  Substance and Sexual Activity  . Alcohol use: No  . Drug use: No  . Sexual activity: Not on file  Other Topics Concern  . Not on file  Social History Narrative  . Not on file   Social Determinants of Health   Financial Resource Strain:   . Difficulty of Paying Living Expenses:   Food Insecurity:   . Worried About Charity fundraiser in the Last Year:   . Arboriculturist in the Last Year:   Transportation Needs:   . Film/video editor (Medical):   Marland Kitchen Lack of Transportation (Non-Medical):   Physical Activity:   . Days of Exercise per Week:   . Minutes of Exercise per Session:   Stress:   . Feeling of Stress :  Social Connections:   . Frequency of Communication with Friends and Family:   . Frequency of Social Gatherings with Friends and Family:   . Attends Religious Services:   . Active Member of Clubs or Organizations:   . Attends Archivist Meetings:   Marland Kitchen Marital Status:     Review of Systems  Constitutional: Negative for chills, fatigue and fever.  HENT: Positive for ear pain. Negative for congestion, rhinorrhea and sore throat.   Respiratory: Negative for cough and shortness of breath.   Cardiovascular: Negative for chest pain and leg swelling.  Gastrointestinal: Negative for abdominal pain, constipation, diarrhea, nausea and vomiting.  Genitourinary: Positive for dysuria, frequency and urgency.  Musculoskeletal:  Positive for neck pain (tightness of neck). Negative for back pain and myalgias.  Neurological: Positive for headaches (aimovig has helped and uses imitrix for breakthorugh. ). Negative for dizziness, weakness and light-headedness.  Psychiatric/Behavioral: Negative for dysphoric mood. The patient is not nervous/anxious.      Objective:  BP 140/70   Pulse 84   Temp (!) 97.1 F (36.2 C)   Resp 18   Ht 5\' 4"  (1.626 m)   Wt 179 lb 3.2 oz (81.3 kg)   BMI 30.76 kg/m   BP/Weight 01/19/2020 10/20/2019 Q000111Q  Systolic BP XX123456 Q000111Q XX123456  Diastolic BP 70 78 99991111  Wt. (Lbs) 179.2 179.4 186  BMI 30.76 30.79 30.95    Physical Exam Vitals reviewed.  Constitutional:      Appearance: Normal appearance. She is normal weight.  Neck:     Vascular: No carotid bruit.  Cardiovascular:     Rate and Rhythm: Normal rate and regular rhythm.     Pulses: Normal pulses.     Heart sounds: Normal heart sounds.  Pulmonary:     Effort: Pulmonary effort is normal. No respiratory distress.     Breath sounds: Normal breath sounds.  Abdominal:     General: Abdomen is flat. Bowel sounds are normal.     Palpations: Abdomen is soft.     Tenderness: There is no abdominal tenderness.  Musculoskeletal:     Cervical back: Rigidity present.  Neurological:     Mental Status: She is alert and oriented to person, place, and time.  Psychiatric:        Mood and Affect: Mood normal.        Behavior: Behavior normal.     Diabetic Foot Exam - Simple   Simple Foot Form Diabetic Foot exam was performed with the following findings: Yes 01/19/2020  8:34 AM  Visual Inspection No deformities, no ulcerations, no other skin breakdown bilaterally: Yes Sensation Testing Intact to touch and monofilament testing bilaterally: Yes Pulse Check Posterior Tibialis and Dorsalis pulse intact bilaterally: Yes Comments      Lab Results  Component Value Date   WBC 6.1 01/13/2020   HGB 14.2 01/13/2020   HCT 43.1 01/13/2020    PLT 443 01/13/2020   GLUCOSE 127 (H) 01/13/2020   CHOL 161 01/13/2020   TRIG 200 (H) 01/13/2020   HDL 52 01/13/2020   LDLCALC 76 01/13/2020   ALT 18 01/13/2020   AST 21 01/13/2020   NA 137 01/13/2020   K 4.7 01/13/2020   CL 97 01/13/2020   CREATININE 0.89 01/13/2020   BUN 16 01/13/2020   CO2 25 01/13/2020   INR 0.92 04/01/2013   HGBA1C 7.3 (H) 01/13/2020   MICROALBUR 80 01/19/2020      Assessment & Plan:   1. Essential hypertension, benign The  current medical regimen is effective;  continue present plan and medications.  2.  Dyslipidemia associated with type 2 diabetes mellitus ([place]) Control: fairly good. Recommend check sugars fasting daily. Recommend check feet daily. Recommend annual eye exams. Medicines: no changes at this time. Continue to work on eating a healthy diet and exercise.  Labs drawn today.   - POCT UA - Microalbumin 80.   3. Acute cystitis without hematuria Rx for cipro sent. - POCT urinalysis dipstick - Urine Culture  4. Hyperlipidemia -  Well controlled.  No changes to medicines.  Continue to work on eating a healthy diet and exercise.  Labs reviewed today. .  5. Primary insomnia The current medical regimen is effective;  continue present plan and medications.  6. Anxiety Refill of lorazepam sent. Pt uses sparingly.   7. Obesity, Class I, BMI 30-34.9 Control: stable  Meds ordered this encounter  Medications  . ciprofloxacin (CIPRO) 500 MG tablet    Sig: Take 1 tablet (500 mg total) by mouth 2 (two) times daily.    Dispense:  14 tablet    Refill:  0  . cyclobenzaprine (FLEXERIL) 5 MG tablet    Sig: Take 1 tablet (5 mg total) by mouth 3 (three) times daily as needed for muscle spasms.    Dispense:  60 tablet    Refill:  1  . LORazepam (ATIVAN) 0.5 MG tablet    Sig: Take 1 tablet (0.5 mg total) by mouth daily as needed for anxiety.    Dispense:  30 tablet    Refill:  2    Orders Placed This Encounter  Procedures  . Urine  Culture  . POCT UA - Microalbumin  . POCT urinalysis dipstick     Follow-up: Return in about 3 months (around 04/20/2020) for fasting.  An After Visit Summary was printed and given to the patient.  Rochel Brome [facility] [phone]

## 2020-01-19 ENCOUNTER — Encounter: Payer: Self-pay | Admitting: Family Medicine

## 2020-01-19 ENCOUNTER — Ambulatory Visit: Payer: BC Managed Care – PPO | Admitting: Family Medicine

## 2020-01-19 ENCOUNTER — Other Ambulatory Visit: Payer: Self-pay

## 2020-01-19 VITALS — BP 140/70 | HR 84 | Temp 97.1°F | Resp 18 | Ht 64.0 in | Wt 179.2 lb

## 2020-01-19 DIAGNOSIS — E1169 Type 2 diabetes mellitus with other specified complication: Secondary | ICD-10-CM | POA: Diagnosis not present

## 2020-01-19 DIAGNOSIS — N3000 Acute cystitis without hematuria: Secondary | ICD-10-CM | POA: Diagnosis not present

## 2020-01-19 DIAGNOSIS — E782 Mixed hyperlipidemia: Secondary | ICD-10-CM | POA: Diagnosis not present

## 2020-01-19 DIAGNOSIS — E669 Obesity, unspecified: Secondary | ICD-10-CM | POA: Insufficient documentation

## 2020-01-19 DIAGNOSIS — I10 Essential (primary) hypertension: Secondary | ICD-10-CM | POA: Diagnosis not present

## 2020-01-19 DIAGNOSIS — E66811 Obesity, class 1: Secondary | ICD-10-CM | POA: Insufficient documentation

## 2020-01-19 DIAGNOSIS — R300 Dysuria: Secondary | ICD-10-CM | POA: Diagnosis not present

## 2020-01-19 DIAGNOSIS — F419 Anxiety disorder, unspecified: Secondary | ICD-10-CM | POA: Insufficient documentation

## 2020-01-19 DIAGNOSIS — F5101 Primary insomnia: Secondary | ICD-10-CM | POA: Insufficient documentation

## 2020-01-19 LAB — POCT URINALYSIS DIPSTICK
Bilirubin, UA: NEGATIVE
Glucose, UA: NEGATIVE
Ketones, UA: NEGATIVE
Nitrite, UA: NEGATIVE
Protein, UA: POSITIVE — AB
Spec Grav, UA: 1.025 (ref 1.010–1.025)
Urobilinogen, UA: 0.2 E.U./dL
pH, UA: 6 (ref 5.0–8.0)

## 2020-01-19 LAB — POCT UA - MICROALBUMIN: Microalbumin Ur, POC: 80 mg/L

## 2020-01-19 MED ORDER — CIPROFLOXACIN HCL 500 MG PO TABS
500.0000 mg | ORAL_TABLET | Freq: Two times a day (BID) | ORAL | 0 refills | Status: DC
Start: 2020-01-19 — End: 2020-04-20

## 2020-01-19 MED ORDER — LORAZEPAM 0.5 MG PO TABS: 0.5000 mg | ORAL_TABLET | Freq: Every day | ORAL | 2 refills | Status: DC | PRN

## 2020-01-19 MED ORDER — CYCLOBENZAPRINE HCL 5 MG PO TABS
5.0000 mg | ORAL_TABLET | Freq: Three times a day (TID) | ORAL | 1 refills | Status: DC | PRN
Start: 2020-01-19 — End: 2020-04-20

## 2020-01-21 LAB — URINE CULTURE

## 2020-02-11 ENCOUNTER — Telehealth: Payer: Self-pay

## 2020-02-11 DIAGNOSIS — N390 Urinary tract infection, site not specified: Secondary | ICD-10-CM | POA: Diagnosis not present

## 2020-02-11 DIAGNOSIS — R300 Dysuria: Secondary | ICD-10-CM | POA: Diagnosis not present

## 2020-02-11 NOTE — Telephone Encounter (Signed)
Lindsey [name] called lvm stating she finished the antibiotic rx'd for her UTI, she felt good for a day and then noticed blood in her urine. She wanted you to be aware that she went ahead and scheduled an appt with Dr. Ara Kussmaul office for this Friday in the a.m.

## 2020-02-23 ENCOUNTER — Other Ambulatory Visit: Payer: Self-pay | Admitting: Family Medicine

## 2020-03-10 ENCOUNTER — Encounter: Payer: Self-pay | Admitting: Family Medicine

## 2020-03-12 ENCOUNTER — Other Ambulatory Visit: Payer: Self-pay | Admitting: *Deleted

## 2020-03-12 ENCOUNTER — Other Ambulatory Visit: Payer: Self-pay | Admitting: Neurology

## 2020-03-12 DIAGNOSIS — G43019 Migraine without aura, intractable, without status migrainosus: Secondary | ICD-10-CM

## 2020-03-12 MED ORDER — AIMOVIG 140 MG/ML ~~LOC~~ SOAJ: SUBCUTANEOUS | 5 refills | Status: DC

## 2020-03-15 ENCOUNTER — Telehealth: Payer: Self-pay

## 2020-03-15 NOTE — Telephone Encounter (Signed)
Patient aware to wait until f/u appt.

## 2020-03-15 NOTE — Telephone Encounter (Signed)
Wait for follow up appointment lp

## 2020-03-15 NOTE — Telephone Encounter (Signed)
Patient called lvm stating she has been having issues with her. FBS (ranges 145-170's) she stated that during a visit it was suggested that she may need to add another diabetes medication. Reported that she has been reluctant however she is willing to start one now or samples of one. If you prefer she can also wait until her f/u appt next month.

## 2020-04-15 ENCOUNTER — Other Ambulatory Visit: Payer: BC Managed Care – PPO

## 2020-04-15 ENCOUNTER — Other Ambulatory Visit: Payer: Self-pay

## 2020-04-15 DIAGNOSIS — I10 Essential (primary) hypertension: Secondary | ICD-10-CM

## 2020-04-15 DIAGNOSIS — E1169 Type 2 diabetes mellitus with other specified complication: Secondary | ICD-10-CM | POA: Diagnosis not present

## 2020-04-15 DIAGNOSIS — E785 Hyperlipidemia, unspecified: Secondary | ICD-10-CM

## 2020-04-15 DIAGNOSIS — E119 Type 2 diabetes mellitus without complications: Secondary | ICD-10-CM

## 2020-04-16 LAB — CBC WITH DIFFERENTIAL/PLATELET
Basophils Absolute: 0 10*3/uL (ref 0.0–0.2)
Basos: 1 %
EOS (ABSOLUTE): 0.1 10*3/uL (ref 0.0–0.4)
Eos: 2 %
Hematocrit: 42.6 % (ref 34.0–46.6)
Hemoglobin: 13.7 g/dL (ref 11.1–15.9)
Immature Grans (Abs): 0 10*3/uL (ref 0.0–0.1)
Immature Granulocytes: 0 %
Lymphocytes Absolute: 1.9 10*3/uL (ref 0.7–3.1)
Lymphs: 29 %
MCH: 28.1 pg (ref 26.6–33.0)
MCHC: 32.2 g/dL (ref 31.5–35.7)
MCV: 88 fL (ref 79–97)
Monocytes Absolute: 0.7 10*3/uL (ref 0.1–0.9)
Monocytes: 10 %
Neutrophils Absolute: 4 10*3/uL (ref 1.4–7.0)
Neutrophils: 58 %
Platelets: 388 10*3/uL (ref 150–450)
RBC: 4.87 x10E6/uL (ref 3.77–5.28)
RDW: 13.6 % (ref 11.7–15.4)
WBC: 6.7 10*3/uL (ref 3.4–10.8)

## 2020-04-16 LAB — HEMOGLOBIN A1C
Est. average glucose Bld gHb Est-mCnc: 160 mg/dL
Hgb A1c MFr Bld: 7.2 % — ABNORMAL HIGH (ref 4.8–5.6)

## 2020-04-16 LAB — COMPREHENSIVE METABOLIC PANEL
ALT: 15 IU/L (ref 0–32)
AST: 16 IU/L (ref 0–40)
Albumin/Globulin Ratio: 1.7 (ref 1.2–2.2)
Albumin: 4.5 g/dL (ref 3.8–4.8)
Alkaline Phosphatase: 92 IU/L (ref 48–121)
BUN/Creatinine Ratio: 22 (ref 12–28)
BUN: 20 mg/dL (ref 8–27)
Bilirubin Total: 0.5 mg/dL (ref 0.0–1.2)
CO2: 22 mmol/L (ref 20–29)
Calcium: 10.4 mg/dL — ABNORMAL HIGH (ref 8.7–10.3)
Chloride: 97 mmol/L (ref 96–106)
Creatinine, Ser: 0.92 mg/dL (ref 0.57–1.00)
GFR calc Af Amer: 77 mL/min/{1.73_m2} (ref >59)
GFR calc non Af Amer: 67 mL/min/{1.73_m2} (ref >59)
Globulin, Total: 2.7 g/dL (ref 1.5–4.5)
Glucose: 139 mg/dL — ABNORMAL HIGH (ref 65–99)
Potassium: 4.3 mmol/L (ref 3.5–5.2)
Sodium: 136 mmol/L (ref 134–144)
Total Protein: 7.2 g/dL (ref 6.0–8.5)

## 2020-04-16 LAB — LIPID PANEL
Chol/HDL Ratio: 4 ratio (ref 0.0–4.4)
Cholesterol, Total: 167 mg/dL (ref 100–199)
HDL: 42 mg/dL (ref >39)
LDL Chol Calc (NIH): 78 mg/dL (ref 0–99)
Triglycerides: 289 mg/dL — ABNORMAL HIGH (ref 0–149)
VLDL Cholesterol Cal: 47 mg/dL — ABNORMAL HIGH (ref 5–40)

## 2020-04-16 LAB — CARDIOVASCULAR RISK ASSESSMENT

## 2020-04-20 ENCOUNTER — Encounter: Payer: Self-pay | Admitting: Neurology

## 2020-04-20 ENCOUNTER — Ambulatory Visit: Payer: BC Managed Care – PPO | Admitting: Neurology

## 2020-04-20 ENCOUNTER — Telehealth: Payer: Self-pay

## 2020-04-20 VITALS — BP 122/78 | HR 65 | Ht 64.0 in | Wt 186.0 lb

## 2020-04-20 DIAGNOSIS — G43019 Migraine without aura, intractable, without status migrainosus: Secondary | ICD-10-CM | POA: Diagnosis not present

## 2020-04-20 DIAGNOSIS — G4489 Other headache syndrome: Secondary | ICD-10-CM | POA: Diagnosis not present

## 2020-04-20 MED ORDER — AJOVY 225 MG/1.5ML ~~LOC~~ SOAJ: 225.0000 mg | SUBCUTANEOUS | 3 refills | Status: DC

## 2020-04-20 MED ORDER — TIZANIDINE HCL 2 MG PO CAPS: 2.0000 mg | ORAL_CAPSULE | Freq: Every day | ORAL | 1 refills | Status: DC | PRN

## 2020-04-20 NOTE — Progress Notes (Signed)
Subjective:    Patient ID: Lindsey [name] is a 64 y.o. female.  HPI     Interim history:   Lindsey [name] is a 64 year old right-handed woman with an underlying medical history of arthritis, status post right total knee replacement in 2014, hypertension, reflux disease, anxiety, hyperlipidemia, reflux disease, recurrent UTI, diabetes, and borderline obesity, who presents for follow-up consultation of her migraine headaches.  The patient is unaccompanied today.  I first met her on 07/29/2019 at the request of her primary care physician, at which time she reported that she had done well with Aimovig but recently had a denial through her insurance.  I suggested we resubmit for Aimovig approval and we were able to get her the prescription and have her restart Aimovig.  She was also on Imitrex as needed.  Today, 04/20/2020: She reports that she did not take her Aimovig injection earlier this month as in the past month she had increase in her migraine frequency and felt that maybe the Aimovig is no longer effective for her.  She did not have a reaction to it.  She did not have any telltale triggers other than general increase in stress.  She has used Flexeril as needed for muscle tension and tension related headaches but has had some side effects which are mood related from it.  She reports that a day or 2 after she takes the Flexeril she has mood irritability.  She has been using the Imitrex and had to use more of it last month.  Her eye exam is up-to-date. She sleeps fairly well.  Denies snoring, morning headaches or nocturia.  The patient's allergies, current medications, family history, past medical history, past social history, past surgical history and problem list were reviewed and updated as appropriate.    Previously:   07/29/19: (She) reports a longstanding history of migraine headaches, essentially since her 11s.  She has had multiple medication intolerances, reports that she is sensitive to  medications, her headaches are currently worse because she has not been able to take the Aimovig injections.  She was on Aimovig for a year and had exceedingly good results with it but lately, her insurance has not covered it and it is too expensive for her.  She has recently tried Topamax and had significant side effects, including changes in her taste and a drugged feeling.  In the past, she was on verapamil, which did not help.  She Also recalls trying amitriptyline/Elavil, and it was not Effective or she may have had a reaction to it.  She generally speaking is quite sensitive to medications, she has tried sertraline, Celexa and Effexor, all caused side effects and sometimes even worsening of her headaches.  She had recent blood work through your office, was started on medication for cholesterol, states that her LDL was elevated and her A1c was above 7.  She has a eye examination typically once a year and is up-to-date with her eye exam, she has prescription eyeglasses.  Her daughter has developed migraines, her son has no migraines.  She lives with her husband, she currently does not drink any caffeine, she is a non-smoker and drinks alcohol infrequently, maybe once a month.  She tries to hydrate well, triggers for her headaches are typically stress, dehydration and feeling of neck tension, she has not noticed any food triggers.  She works as an Scientist, water quality.  She has never had a brain MRI.  She denies any snoring or nocturia or morning headaches.  Her  headaches are typically one-sided, typically left-sided and also in the left neck area, throbbing, associated with nausea but typically no vomiting.  She has typically no visual aura.  She does get sensitive to light, current headache frequency is at least 2 or 3/week, lasting for several hours.  She takes Imitrex, she finds it helpful when she takes it early on.  When she was on Aimovig she hardly had to take the Imitrex.  Unfortunately, while the Imitrex  is very effective in aborting her migraine, she has sleepiness from it.  She does not like to take it. I reviewed your office note from 07/17/2019, which you kindly included.  She has been on generic Imitrex 100 mg strength as needed.  For migraine prevention, she has been on Topamax which was recently discontinued, prior to that she had tried verapamil.  She was given samples for Aimovig at her appointment with your office recently.  She denies any one-sided weakness or numbness or tingling or droopy face or slurring of speech.    Addendum, 07/30/2019: I reviewed patient's laboratory test results from 07/14/2019: CBC with differential was unremarkable, CMP showed glucose level of 139, BUN 19, creatinine 0.96, otherwise unremarkable, lipid panel showed total cholesterol of 154, triglycerides 261, LDL 67, A1c was 7.4, TSH 2.3.  Her Past Medical History Is Significant For: Past Medical History:  Diagnosis Date  . Arthritis   . 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 Diabetes mellitus without complication ([place])   . Frequent UTI     h/o since hysterectomy/04/01/13 asymptomatic  . GERD (gastroesophageal reflux disease)   . Headache(784.0)    migraines  . Hypertension     Her Past Surgical History Is Significant For: Past Surgical History:  Procedure Laterality Date  . ABDOMINAL HYSTERECTOMY    . CHOLECYSTECTOMY    . ENTEROCELE REPAIR    . KNEE ARTHROSCOPY Right   . KNEE ARTHROSCOPY Right 07/01/2014   Procedure: RIGHT ARTHROSCOPY KNEE WITH SYNOVECTOMY;  Surgeon: [name], MD;  Location: [facility];  Service: Orthopedics;  Laterality: Right;  . RECTOCELE REPAIR    . TONSILLECTOMY    . TOTAL KNEE ARTHROPLASTY Right 04/07/2013   Procedure: RIGHT TOTAL KNEE ARTHROPLASTY;  Surgeon: [name], MD;  Location: [facility];  Service: Orthopedics;  Laterality: Right;    Her Family History Is Significant For: No family history on file.  Her Social History Is Significant For: Social History    Socioeconomic History  . Marital status: Married    Spouse name: Not on file  . Number of children: Not on file  . Years of education: Not on file  . Highest education level: Not on file  Occupational History  . Not on file  Tobacco Use  . Smoking status: Never Smoker  . Smokeless tobacco: Never Used  Substance and Sexual Activity  . Alcohol use: No  . Drug use: No  . Sexual activity: Not on file  Other Topics Concern  . Not on file  Social History Narrative  . Not on file   Social Determinants of Health   Financial Resource Strain:   . Difficulty of Paying Living Expenses:   Food Insecurity:   . Worried About Charity fundraiser in the Last Year:   . Arboriculturist in the Last Year:   Transportation Needs:   . Film/video editor (Medical):   Marland Kitchen Lack of Transportation (Non-Medical):   Physical Activity:   . Days of Exercise  per Week:   . Minutes of Exercise per Session:   Stress:   . Feeling of Stress :   Social Connections:   . Frequency of Communication with Friends and Family:   . Frequency of Social Gatherings with Friends and Family:   . Attends Religious Services:   . Active Member of Clubs or Organizations:   . Attends Archivist Meetings:   Marland Kitchen Marital Status:     Her Allergies Are:  Allergies  Allergen Reactions  . Amoxicillin Hives  . Citalopram Other (See Comments)    headache  . Erythromycin Hives and Rash  . Hydrocodone Other (See Comments)    Achy all over   . Sertraline Other (See Comments)    irritability  . Sulfa Antibiotics Rash    fever  . Tramadol Nausea Only and Palpitations  . Trazodone And Nefazodone Other (See Comments)    Insomnia   . Venlafaxine Other (See Comments)    migrane   :   Her Current Medications Are:  Outpatient Encounter Medications as of 04/20/2020  Medication Sig  . aspirin EC 81 MG tablet Take 81 mg by mouth every morning.  . Calcium Carb-Cholecalciferol (CALCIUM 600 + D PO) Take 1 tablet by  mouth at bedtime.  Marland Kitchen lisinopril-hydrochlorothiazide (PRINZIDE,ZESTORETIC) 20-25 MG per tablet Take 1 tablet by mouth at bedtime.  Marland Kitchen LORazepam (ATIVAN) 0.5 MG tablet Take 1 tablet (0.5 mg total) by mouth daily as needed for anxiety.  . metFORMIN (GLUCOPHAGE) 500 MG tablet TAKE 1 TABLET BY MOUTH  TWICE DAILY WITH MORNING   AND EVENING MEALS  . omeprazole (PRILOSEC) 20 MG capsule TAKE 1 CAPSULE BY MOUTH ONCE DAILY BEFORE A MEAL  . pravastatin (PRAVACHOL) 10 MG tablet TAKE 1 TABLET BY MOUTH ONCE DAILY  . SUMAtriptan (IMITREX) 100 MG tablet Take 100 mg by mouth every 2 (two) hours as needed for migraine.  Marland Kitchen zolpidem (AMBIEN CR) 12.5 MG CR tablet TAKE 1 TABLET BY MOUTH AT BEDTIME  . Erenumab-aooe (AIMOVIG) 140 MG/ML SOAJ INJECT 1 ML BY SUBCUTANEOUS ROUTE ONCE A MONTH (Patient not taking: Reported on 04/20/2020)  . [DISCONTINUED] ciprofloxacin (CIPRO) 500 MG tablet Take 1 tablet (500 mg total) by mouth 2 (two) times daily.  . [DISCONTINUED] cyclobenzaprine (FLEXERIL) 5 MG tablet Take 1 tablet (5 mg total) by mouth 3 (three) times daily as needed for muscle spasms.   No facility-administered encounter medications on file as of 04/20/2020.  :  Review of Systems:  Out of a complete 14 point review of systems, all are reviewed and negative with the exception of these symptoms as listed below: Review of Systems  Neurological:       Pt here for f/u on migraines. She reports last month she had multiple h/a and stopped the aimovig. Since stopping h/a have improved. Would like to discuss other options.     Objective:  Neurological Exam  Physical Exam Physical Examination:   Vitals:   04/20/20 0936  BP: 122/78  Pulse: 65  SpO2: 96%    General Examination: The patient is a very pleasant 64 y.o. female in no acute distress. She appears well-developed and well-nourished and well groomed.   HEENT: Normocephalic, atraumatic, pupils are equal, round and reactive to light. Extraocular tracking is good  without limitation to gaze excursion or nystagmus noted. Normal smooth pursuit is noted. Hearing is grossly intact. Face is symmetric with normal facial animation and normal facial sensation. Speech is clear with no dysarthria noted. There is no  hypophonia. There is no lip, neck/head, jaw or voice tremor. Neck is supple with full range of passive and active motion. There are no carotid bruits on auscultation. Oropharynx exam reveals: moderate mouth dryness, adequate dental hygiene and mild airway crowding. Tongue protrudes centrally and palate elevates symmetrically.   Chest: Clear to auscultation without wheezing, rhonchi or crackles noted.  Heart: S1+S2+0, regular and normal without murmurs, rubs or gallops noted.   Abdomen: Soft, non-tender and non-distended with normal bowel sounds appreciated on auscultation.  Extremities: There is no pitting edema in the distal lower extremities bilaterally. Pedal pulses are intact.  Skin: Warm and dry without trophic changes noted.  Musculoskeletal: exam reveals no obvious joint deformities, tenderness or joint swelling or erythema.   Neurologically:  Mental status: The patient is awake, alert and oriented in all 4 spheres. Her immediate and remote memory, attention, language skills and fund of knowledge are appropriate. There is no evidence of aphasia, agnosia, apraxia or anomia. Speech is clear with normal prosody and enunciation. Thought process is linear. Mood is normal and affect is normal.  Cranial nerves II - XII are as described above under HEENT exam. In addition: shoulder shrug is normal with equal shoulder height noted. Motor exam: Normal bulk, strength and tone is noted. There is no drift, tremor or rebound. Romberg is negative. Fine motor skills and coordination: grossly intact.  Cerebellar testing: No dysmetria or intention tremor. There is no truncal or gait ataxia.  Sensory exam: intact to light touch in the upper and lower  extremities.  Gait, station and balance: She stands easily. No veering to one side is noted. No leaning to one side is noted. Posture is age-appropriate and stance is narrow based. Gait shows normal stride length and normal pace. No problems turning are noted.   Assessment and Plan:   In summary, Lindsey [name] is a very pleasant 64 year old female with an underlying medical history of arthritis, status post right total knee replacement in 2014, hypertension, reflux disease, anxiety, hyperlipidemia, reflux disease, recurrent UTI, diabetes, and borderline obesity, who presents for follow-up consultation of her migraine headaches.  She has tried and failed multiple preventative medications including SSRI and SNRI type antidepressants including sertraline, Celexa, Effexor and also recalls trying Elavil.  She has tried verapamil in the past and more recently Topamax, most of these medications cause side effects including increase in her migraines even.  She eventually was able to get back on Aimovig and had good results for nearly 2 years.  In the past month she has had increase in her migraine frequency and feels that the Aimovig has not been effective.  She is advised that sometimes we can successfully switch to another injectable preventative in the same family as Aimovig.  To that end, I recommend that we switch her to Ajovy injections.  She is agreeable to this approach.  For tension headaches she has tried Flexeril as needed but reports side effects from it.  I suggested a trial of low-dose generic Zanaflex.  She is advised that it is potentially sedating and could affect her driving.  She reports that she uses as needed medication very cautiously.  She does find the Imitrex still effective.  Down the road, we could consider Ubrelvy or Nurtec as needed.  She does have multiple intolerances and allergies to medications including sulfa allergy.  We have to keep in mind her sensitivity to medications.  For  now, she will continue with as needed use of Imitrex,  try as needed Zanaflex and hopefully can switch to Ajovy injections in the next few days or weeks.  She is advised to follow-up in about 3 months, she requests a virtual appointment, she is advised to have a video visit with one of our nurse practitioners.  She had a brain MRI in December last year which was benign thankfully.  Physical exam and neurological exam are nonfocal which is reassuring.  I answered all her questions today and she was in agreement with the plan. I spent 30 minutes in total face-to-face time and in reviewing records during pre-charting, more than 50% of which was spent in counseling and coordination of care, reviewing test results, reviewing medications and treatment regimen and/or in discussing or reviewing the diagnosis of migraines, the prognosis and treatment options. Pertinent laboratory and imaging test results that were available during this visit with the patient were reviewed by me and considered in my medical decision making (see chart for details).

## 2020-04-20 NOTE — Patient Instructions (Addendum)
You have tried Aimovig injections and while it worked well for you for several months, it is possible that it is no longer as effective.  I believe you may have better results with Ajovy 225 mg/1.5 ml, it is also 1 injection subcutaneously every 30 days.  Potential side effects include: Palpitations (as in fast heartbeat), hives, itching, rash, hoarseness, irritation at the injection site, joint pain and stiffness or joint swelling, swelling of the eyelids, face, lips, hands or feet, chest tightness, trouble breathing or swallowing.  Some side effects are mild and go away as your body gets used to the new medication.    If you have any serious side effects such as bleeding or blistering or discoloration of your skin, hives, significant itching or a rash, please seek immediate medical attention by going to the ER or calling 911.   Injection site reactions include itching, redness, pain at the injection site and Rare side effects include: Anaphylaxis (a severe allergic reaction), angioedema (severe swelling including around mouth and tongue).  I am hopeful that you will tolerate it just as well as the Aimovig.  For your muscle tension related headaches I suggest a trial of Zanaflex, generic name is tizanidine, 2 mg strength, take 1 pill daily as needed for muscle tension. Please be mindful of that it can cause drowsiness or sleepiness and may affect your driving.   We can consider as needed Nurtec or Ubrelvy down the road for acute migraine management. For now, you can continue with your Imitrex as needed.

## 2020-04-20 NOTE — Telephone Encounter (Signed)
PA for ajovy has been initiated and sent to [facility] via cover my meds. Key: [id number] - PA Case ID: [id number]

## 2020-04-21 ENCOUNTER — Other Ambulatory Visit: Payer: Self-pay

## 2020-04-21 ENCOUNTER — Ambulatory Visit: Payer: BC Managed Care – PPO | Admitting: Family Medicine

## 2020-04-21 ENCOUNTER — Encounter: Payer: Self-pay | Admitting: Family Medicine

## 2020-04-21 VITALS — BP 136/78 | HR 84 | Temp 97.3°F | Resp 16 | Ht 64.0 in | Wt 183.6 lb

## 2020-04-21 DIAGNOSIS — J018 Other acute sinusitis: Secondary | ICD-10-CM

## 2020-04-21 DIAGNOSIS — J019 Acute sinusitis, unspecified: Secondary | ICD-10-CM | POA: Insufficient documentation

## 2020-04-21 DIAGNOSIS — E785 Hyperlipidemia, unspecified: Secondary | ICD-10-CM | POA: Diagnosis not present

## 2020-04-21 DIAGNOSIS — E1169 Type 2 diabetes mellitus with other specified complication: Secondary | ICD-10-CM | POA: Diagnosis not present

## 2020-04-21 DIAGNOSIS — Z23 Encounter for immunization: Secondary | ICD-10-CM

## 2020-04-21 DIAGNOSIS — E782 Mixed hyperlipidemia: Secondary | ICD-10-CM

## 2020-04-21 DIAGNOSIS — I10 Essential (primary) hypertension: Secondary | ICD-10-CM | POA: Diagnosis not present

## 2020-04-21 DIAGNOSIS — G43009 Migraine without aura, not intractable, without status migrainosus: Secondary | ICD-10-CM

## 2020-04-21 LAB — POCT UA - MICROALBUMIN: Microalbumin Ur, POC: 30 mg/L

## 2020-04-21 MED ORDER — ICOSAPENT ETHYL 1 G PO CAPS: 2.0000 g | ORAL_CAPSULE | Freq: Two times a day (BID) | ORAL | 1 refills | Status: DC

## 2020-04-21 MED ORDER — SYNJARDY XR 12.5-1000 MG PO TB24: 2.0000 | ORAL_TABLET | Freq: Every day | ORAL | 1 refills | Status: DC

## 2020-04-21 MED ORDER — CEFDINIR 300 MG PO CAPS: 300.0000 mg | ORAL_CAPSULE | Freq: Two times a day (BID) | ORAL | 0 refills | Status: DC

## 2020-04-21 MED ORDER — CEFDINIR 300 MG PO CAPS
300.0000 mg | ORAL_CAPSULE | Freq: Two times a day (BID) | ORAL | 0 refills | Status: DC
Start: 2020-04-21 — End: 2020-04-21

## 2020-04-21 NOTE — Progress Notes (Signed)
Subjective:  Patient ID: Lindsey [name], female    DOB: 08-04-1956  Age: 64 y.o. MRN: [id number]  Chief Complaint  Patient presents with  . Hyperlipidemia  . Hypertension    HPI Patient is a 64 year old white female who presents for follow-up of diabetes, hypertension, hyperlipidemia, and migraines.   Diabetes: Currently her sugars are running 135-175.  She checks her feet daily.  She has been eating healthy for the most part although she feels she can do better.  She is not exercising.  She is compliant with taking her medications.  Her eye exam is set up for July 2021.  For diabetes she is currently taking Metformin.  Hyperlipidemia: Patient is currently taking pravastatin for her cholesterol.    Hypertension: She takes lisinopril hydrochlorothiazide for her blood pressure.  BP at home is 120s-80s.  For her migraines she takes Aimovig once a month and Imitrex as needed.  Aimovig is not working quite as well and her insurance will no longer pay for it. She is seeing neurologist.   Past Medical History:  Diagnosis Date  . Arthritis   . 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 Diabetes mellitus without complication ([place])   . Frequent UTI     h/o since hysterectomy/04/01/13 asymptomatic  . GERD (gastroesophageal reflux disease)   . Headache(784.0)    migraines  . Hypertension    Past Surgical History:  Procedure Laterality Date  . ABDOMINAL HYSTERECTOMY    . CHOLECYSTECTOMY    . ENTEROCELE REPAIR    . KNEE ARTHROSCOPY Right   . KNEE ARTHROSCOPY Right 07/01/2014   Procedure: RIGHT ARTHROSCOPY KNEE WITH SYNOVECTOMY;  Surgeon: [name], MD;  Location: [facility];  Service: Orthopedics;  Laterality: Right;  . RECTOCELE REPAIR    . TONSILLECTOMY    . TOTAL KNEE ARTHROPLASTY Right 04/07/2013   Procedure: RIGHT TOTAL KNEE ARTHROPLASTY;  Surgeon: [name], MD;  Location: [facility];  Service: Orthopedics;  Laterality: Right;    No family history on file. Social History    Socioeconomic History  . Marital status: Married    Spouse name: Not on file  . Number of children: Not on file  . Years of education: Not on file  . Highest education level: Not on file  Occupational History  . Not on file  Tobacco Use  . Smoking status: Never Smoker  . Smokeless tobacco: Never Used  Substance and Sexual Activity  . Alcohol use: No  . Drug use: No  . Sexual activity: Not on file  Other Topics Concern  . Not on file  Social History Narrative  . Not on file   Social Determinants of Health   Financial Resource Strain:   . Difficulty of Paying Living Expenses: Not on file  Food Insecurity:   . Worried About Charity fundraiser in the Last Year: Not on file  . Ran Out of Food in the Last Year: Not on file  Transportation Needs:   . Lack of Transportation (Medical): Not on file  . Lack of Transportation (Non-Medical): Not on file  Physical Activity:   . Days of Exercise per Week: Not on file  . Minutes of Exercise per Session: Not on file  Stress:   . Feeling of Stress : Not on file  Social Connections:   . Frequency of Communication with Friends and Family: Not on file  . Frequency of Social Gatherings with Friends and Family: Not on file  . Attends  Religious Services: Not on file  . Active Member of Clubs or Organizations: Not on file  . Attends Archivist Meetings: Not on file  . Marital Status: Not on file    Review of Systems  Constitutional: Negative for chills, fatigue and fever.  HENT: Positive for ear pain (left outer ear) and sore throat. Negative for congestion and rhinorrhea.   Respiratory: Negative for cough and shortness of breath.   Cardiovascular: Negative for chest pain, palpitations and leg swelling.  Gastrointestinal: Negative for abdominal pain, constipation, diarrhea, nausea and vomiting.  Genitourinary: Negative for dysuria, frequency and urgency.  Musculoskeletal: Positive for neck pain (tightness of neck). Negative  for back pain and myalgias.  Neurological: Positive for headaches (aimovig not working as well, medication changed yesterday per neurology ). Negative for dizziness, weakness and light-headedness.  Psychiatric/Behavioral: Negative for dysphoric mood. The patient is not nervous/anxious.      Objective:  BP 136/78   Pulse 84   Temp (!) 97.3 F (36.3 C)   Resp 16   Ht 5\' 4"  (1.626 m)   Wt 183 lb 9.6 oz (83.3 kg)   BMI 31.51 kg/m   BP/Weight 04/21/2020 04/20/2020 0/78/6754  Systolic BP 492 010 071  Diastolic BP 78 78 70  Wt. (Lbs) 183.6 186 179.2  BMI 31.51 31.93 30.76    Physical Exam Vitals reviewed.  Constitutional:      Appearance: Normal appearance. She is normal weight.  Neck:     Vascular: No carotid bruit.  Cardiovascular:     Rate and Rhythm: Normal rate and regular rhythm.     Pulses: Normal pulses.     Heart sounds: Normal heart sounds.  Pulmonary:     Effort: Pulmonary effort is normal. No respiratory distress.     Breath sounds: Normal breath sounds.  Abdominal:     General: Abdomen is flat. Bowel sounds are normal.     Palpations: Abdomen is soft.     Tenderness: There is no abdominal tenderness.  Musculoskeletal:     Cervical back: Rigidity present.  Neurological:     Mental Status: She is alert and oriented to person, place, and time.  Psychiatric:        Mood and Affect: Mood normal.        Behavior: Behavior normal.     Diabetic Foot Exam - Simple   Simple Foot Form Diabetic Foot exam was performed with the following findings: Yes 04/25/2020  8:20 PM  Visual Inspection No deformities, no ulcerations, no other skin breakdown bilaterally: Yes Sensation Testing Intact to touch and monofilament testing bilaterally: Yes Pulse Check Posterior Tibialis and Dorsalis pulse intact bilaterally: Yes Comments      Lab Results  Component Value Date   WBC 6.7 04/15/2020   HGB 13.7 04/15/2020   HCT 42.6 04/15/2020   PLT 388 04/15/2020   GLUCOSE 139 (H)  04/15/2020   CHOL 167 04/15/2020   TRIG 289 (H) 04/15/2020   HDL 42 04/15/2020   LDLCALC 78 04/15/2020   ALT 15 04/15/2020   AST 16 04/15/2020   NA 136 04/15/2020   K 4.3 04/15/2020   CL 97 04/15/2020   CREATININE 0.92 04/15/2020   BUN 20 04/15/2020   CO2 22 04/15/2020   INR 0.92 04/01/2013   HGBA1C 7.2 (H) 04/15/2020   MICROALBUR 30 04/21/2020      Assessment & Plan:   1. Essential hypertension, benign The current medical regimen is effective;  continue present plan and medications.  2.  Dyslipidemia associated with type 2 diabetes mellitus ([place]) Control: fairly good. Recommend check sugars fasting daily. Recommend check feet daily. Recommend annual eye exams. Medicines: Stop metformin. Start synjardy xr 12.01-999 mg two daily. Continue to work on eating a healthy diet and exercise.  Labs drawn today.   - POCT UA - Microalbumin 80.  3. Hyperlipidemia  High triglycerides. Start vascepa 1 gm 2 caps twice a day.   Continue to work on eating a healthy diet and exercise.  Labs reviewed today. .  5. Acute nonrecurrent sinusitis. Start on cefdinir.  6. Migraine without aura. Samples of emgality until her neurologist can get it improved. I did not do a loading dose as she has been on aimovig.   7. Needs Flu shot.  Meds ordered this encounter  Medications  . DISCONTD: cefdinir (OMNICEF) 300 MG capsule    Sig: Take 1 capsule (300 mg total) by mouth 2 (two) times daily.    Dispense:  20 capsule    Refill:  0  . icosapent Ethyl (VASCEPA) 1 g capsule    Sig: Take 2 capsules (2 g total) by mouth 2 (two) times daily.    Dispense:  360 capsule    Refill:  1  . Empagliflozin-metFORMIN HCl ER (SYNJARDY XR) 12.01-999 MG TB24    Sig: Take 2 tablets by mouth daily.    Dispense:  180 tablet    Refill:  1  . cefdinir (OMNICEF) 300 MG capsule    Sig: Take 1 capsule (300 mg total) by mouth 2 (two) times daily.    Dispense:  20 capsule    Refill:  0    Orders Placed This  Encounter  Procedures  . Flu Vaccine QUAD 6+ mos PF IM (Fluarix Quad PF)  . POCT UA - Microalbumin     Follow-up: Return in about 3 months (around 07/22/2020) for likes a nurse visit ahead of time. Marland Kitchen  An After Visit Summary was printed and given to the patient.  Rochel Brome [facility] [phone]

## 2020-04-21 NOTE — Patient Instructions (Signed)
Stop Metformin.  Start Synjardy XR 12.01/999 mg 2 daily. High triglycerides start Vascepa 1 g 2 capsules twice daily.  Continue pravastatin. Migraines: Given samples of Emgality until her neurologist gets it approved. Sinusitis: Prescription given for cefdinir Carole Civil). Continue work on diet and exercise.

## 2020-04-22 ENCOUNTER — Other Ambulatory Visit: Payer: Self-pay | Admitting: Family Medicine

## 2020-04-25 ENCOUNTER — Encounter: Payer: Self-pay | Admitting: Family Medicine

## 2020-04-27 NOTE — Telephone Encounter (Signed)
(  Key: [id number])  This request has received a Favorable outcome.  Please note any additional information provided by [employer] at the bottom of your screen.  You will also receive a faxed copy of the determination.

## 2020-05-05 DIAGNOSIS — H61012 Acute perichondritis of left external ear: Secondary | ICD-10-CM | POA: Diagnosis not present

## 2020-05-11 ENCOUNTER — Encounter: Payer: Self-pay | Admitting: Family Medicine

## 2020-05-12 NOTE — Telephone Encounter (Signed)
Have you had a PA for vascepa or synjardy? Kc

## 2020-05-24 ENCOUNTER — Other Ambulatory Visit: Payer: Self-pay | Admitting: Family Medicine

## 2020-06-03 DIAGNOSIS — M948X9 Other specified disorders of cartilage, unspecified sites: Secondary | ICD-10-CM | POA: Diagnosis not present

## 2020-06-09 ENCOUNTER — Other Ambulatory Visit: Payer: Self-pay | Admitting: Family Medicine

## 2020-06-09 MED ORDER — EMGALITY 120 MG/ML ~~LOC~~ SOSY: 1.0000 mL | PREFILLED_SYRINGE | SUBCUTANEOUS | 3 refills | Status: DC

## 2020-06-09 MED ORDER — ICOSAPENT ETHYL 1 G PO CAPS: 2.0000 g | ORAL_CAPSULE | Freq: Two times a day (BID) | ORAL | 1 refills | Status: DC

## 2020-06-09 MED ORDER — SYNJARDY XR 12.5-1000 MG PO TB24: 2.0000 | ORAL_TABLET | Freq: Every day | ORAL | 1 refills | Status: DC

## 2020-06-26 DIAGNOSIS — R300 Dysuria: Secondary | ICD-10-CM | POA: Diagnosis not present

## 2020-07-05 ENCOUNTER — Telehealth: Payer: Self-pay

## 2020-07-05 NOTE — Telephone Encounter (Signed)
PA for vascepa submitted and denied via covermymeds. Waiting for documentation from insurance for more information.

## 2020-07-13 ENCOUNTER — Other Ambulatory Visit: Payer: Self-pay | Admitting: Family Medicine

## 2020-07-13 MED ORDER — OMEGA-3-ACID ETHYL ESTERS 1 G PO CAPS
2.0000 g | ORAL_CAPSULE | Freq: Two times a day (BID) | ORAL | 1 refills | Status: DC
Start: 2020-07-13 — End: 2021-02-04

## 2020-07-19 ENCOUNTER — Other Ambulatory Visit: Payer: Self-pay

## 2020-07-19 ENCOUNTER — Ambulatory Visit: Payer: BC Managed Care – PPO

## 2020-07-19 DIAGNOSIS — E782 Mixed hyperlipidemia: Secondary | ICD-10-CM

## 2020-07-19 DIAGNOSIS — E1169 Type 2 diabetes mellitus with other specified complication: Secondary | ICD-10-CM | POA: Diagnosis not present

## 2020-07-19 DIAGNOSIS — E785 Hyperlipidemia, unspecified: Secondary | ICD-10-CM | POA: Diagnosis not present

## 2020-07-19 DIAGNOSIS — I10 Essential (primary) hypertension: Secondary | ICD-10-CM

## 2020-07-20 LAB — COMPREHENSIVE METABOLIC PANEL
ALT: 16 IU/L (ref 0–32)
AST: 17 IU/L (ref 0–40)
Albumin/Globulin Ratio: 1.8 (ref 1.2–2.2)
Albumin: 4.5 g/dL (ref 3.8–4.8)
Alkaline Phosphatase: 79 IU/L (ref 44–121)
BUN/Creatinine Ratio: 21 (ref 12–28)
BUN: 19 mg/dL (ref 8–27)
Bilirubin Total: 0.5 mg/dL (ref 0.0–1.2)
CO2: 24 mmol/L (ref 20–29)
Calcium: 10.2 mg/dL (ref 8.7–10.3)
Chloride: 97 mmol/L (ref 96–106)
Creatinine, Ser: 0.91 mg/dL (ref 0.57–1.00)
GFR calc Af Amer: 78 mL/min/{1.73_m2} (ref >59)
GFR calc non Af Amer: 67 mL/min/{1.73_m2} (ref >59)
Globulin, Total: 2.5 g/dL (ref 1.5–4.5)
Glucose: 133 mg/dL — ABNORMAL HIGH (ref 65–99)
Potassium: 4.1 mmol/L (ref 3.5–5.2)
Sodium: 137 mmol/L (ref 134–144)
Total Protein: 7 g/dL (ref 6.0–8.5)

## 2020-07-20 LAB — CBC WITH DIFF/PLATELET
Basophils Absolute: 0.1 10*3/uL (ref 0.0–0.2)
Basos: 1 %
EOS (ABSOLUTE): 0.3 10*3/uL (ref 0.0–0.4)
Eos: 5 %
Hematocrit: 42.8 % (ref 34.0–46.6)
Hemoglobin: 13.9 g/dL (ref 11.1–15.9)
Immature Grans (Abs): 0 10*3/uL (ref 0.0–0.1)
Immature Granulocytes: 0 %
Lymphocytes Absolute: 1.5 10*3/uL (ref 0.7–3.1)
Lymphs: 26 %
MCH: 28.2 pg (ref 26.6–33.0)
MCHC: 32.5 g/dL (ref 31.5–35.7)
MCV: 87 fL (ref 79–97)
Monocytes Absolute: 0.4 10*3/uL (ref 0.1–0.9)
Monocytes: 8 %
Neutrophils Absolute: 3.5 10*3/uL (ref 1.4–7.0)
Neutrophils: 60 %
Platelets: 425 10*3/uL (ref 150–450)
RBC: 4.93 x10E6/uL (ref 3.77–5.28)
RDW: 13.8 % (ref 11.7–15.4)
WBC: 5.8 10*3/uL (ref 3.4–10.8)

## 2020-07-20 LAB — LIPID PANEL
Chol/HDL Ratio: 3.5 ratio (ref 0.0–4.4)
Cholesterol, Total: 170 mg/dL (ref 100–199)
HDL: 48 mg/dL (ref >39)
LDL Chol Calc (NIH): 92 mg/dL (ref 0–99)
Triglycerides: 175 mg/dL — ABNORMAL HIGH (ref 0–149)
VLDL Cholesterol Cal: 30 mg/dL (ref 5–40)

## 2020-07-20 LAB — CARDIOVASCULAR RISK ASSESSMENT

## 2020-07-20 LAB — HEMOGLOBIN A1C
Est. average glucose Bld gHb Est-mCnc: 174 mg/dL
Hgb A1c MFr Bld: 7.7 % — ABNORMAL HIGH (ref 4.8–5.6)

## 2020-07-22 ENCOUNTER — Encounter: Payer: Self-pay | Admitting: Family Medicine

## 2020-07-22 ENCOUNTER — Other Ambulatory Visit: Payer: Self-pay

## 2020-07-22 ENCOUNTER — Ambulatory Visit: Payer: BC Managed Care – PPO | Admitting: Family Medicine

## 2020-07-22 VITALS — BP 130/80 | HR 80 | Temp 97.4°F | Resp 18 | Ht 64.0 in | Wt 182.2 lb

## 2020-07-22 DIAGNOSIS — E1169 Type 2 diabetes mellitus with other specified complication: Secondary | ICD-10-CM | POA: Diagnosis not present

## 2020-07-22 DIAGNOSIS — F5101 Primary insomnia: Secondary | ICD-10-CM

## 2020-07-22 DIAGNOSIS — I10 Essential (primary) hypertension: Secondary | ICD-10-CM | POA: Diagnosis not present

## 2020-07-22 DIAGNOSIS — Z6830 Body mass index (BMI) 30.0-30.9, adult: Secondary | ICD-10-CM

## 2020-07-22 DIAGNOSIS — G43009 Migraine without aura, not intractable, without status migrainosus: Secondary | ICD-10-CM | POA: Diagnosis not present

## 2020-07-22 DIAGNOSIS — E782 Mixed hyperlipidemia: Secondary | ICD-10-CM

## 2020-07-22 DIAGNOSIS — E785 Hyperlipidemia, unspecified: Secondary | ICD-10-CM

## 2020-07-22 DIAGNOSIS — K219 Gastro-esophageal reflux disease without esophagitis: Secondary | ICD-10-CM

## 2020-07-22 NOTE — Progress Notes (Signed)
Subjective:  Patient ID: Lindsey [name], female    DOB: 1956-07-08  Age: 64 y.o. MRN: [id number]  Chief Complaint  Patient presents with  . Hypertension  . Diabetes    HPI Diabetes complicated by hyperlipidemia.; sugars 120-150. Improved. Patient was on steroids for approximately 6 weeks.  Started Synjardy 12.01/999 mg 2 daily.  Patient checks her feet daily.  She sees the eye doctor annually.  Her most recent A1c was 7.8.  This was increased from 7.2.  As I said she was on steroids for approximately 6 weeks and in addition to that her Iva Boop was not approved till approximately 3 weeks ago.  Her sugars over the last 3 weeks have improved as listed above.  While she was on steroids they reached up into the 200s.  Denies polyuria, polyphagia, polydipsia, or neuropathy.Eating healthy. Exercising.   Patient was on steroids for recurrent inflammation of her left external ear.  She had a steroid injection, 2 courses of steroid, and then the ENT did 3 steroid injections into her ear.  Finally it has fully resolved.  Hyperlipidemia: taking pravastatin. Vascepa not covered. Sent lovaza 1-2 weeks ago to mail order. Pt is eating healthy and exercising. Dropped triglycerides without medication.  Taking aspirin 81 mg once daily.  Hypertension: Currently on lisinopril/HCTZ 20/25 mg once daily.  Migraines: last emgality shot was in 04/2020. 3 times a month. Imitrex helps. Migraines have not returned.   Insomnia: Lorrin Mais CR is doing well.  New  Anxiety: Patient has sporadic anxiety.  Not full panic attacks but she does have some lorazepam 0.5 mg once daily as needed which she takes sparingly.  Current Outpatient Medications on File Prior to Visit  Medication Sig Dispense Refill  . aspirin EC 81 MG tablet Take 81 mg by mouth every morning.    . Calcium Carb-Cholecalciferol (CALCIUM 600 + D PO) Take 1 tablet by mouth at bedtime.    . Empagliflozin-metFORMIN HCl ER (SYNJARDY XR) 12.01-999 MG TB24 Take 2  tablets by mouth daily. 180 tablet 1  . lisinopril-hydrochlorothiazide (ZESTORETIC) 20-25 MG tablet TAKE 1 TABLET BY MOUTH  DAILY 90 tablet 3  . LORazepam (ATIVAN) 0.5 MG tablet Take 1 tablet (0.5 mg total) by mouth daily as needed for anxiety. 30 tablet 2  . omega-3 acid ethyl esters (LOVAZA) 1 g capsule Take 2 capsules (2 g total) by mouth 2 (two) times daily. 360 capsule 1  . omeprazole (PRILOSEC) 20 MG capsule TAKE 1 CAPSULE BY MOUTH ONCE DAILY BEFORE A MEAL 90 capsule 3  . pravastatin (PRAVACHOL) 10 MG tablet TAKE 1 TABLET BY MOUTH ONCE DAILY 90 tablet 3  . SUMAtriptan (IMITREX) 100 MG tablet Take 100 mg by mouth every 2 (two) hours as needed for migraine.    . tizanidine (ZANAFLEX) 2 MG capsule Take 1 capsule (2 mg total) by mouth daily as needed for muscle spasms. 90 capsule 1  . zolpidem (AMBIEN CR) 12.5 MG CR tablet TAKE 1 TABLET BY MOUTH AT BEDTIME 90 tablet 1   No current facility-administered medications on file prior to visit.   Past Medical History:  Diagnosis Date  . Arthritis   . 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 Diabetes mellitus without complication ([place])   . Frequent UTI     h/o since hysterectomy/04/01/13 asymptomatic  . GERD (gastroesophageal reflux disease)   . Headache(784.0)    migraines  . Hypertension    Past Surgical History:  Procedure Laterality Date  . 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 CHOLECYSTECTOMY    . ENTEROCELE REPAIR    . KNEE ARTHROSCOPY Right   . KNEE ARTHROSCOPY Right 07/01/2014   Procedure: RIGHT ARTHROSCOPY KNEE WITH SYNOVECTOMY;  Surgeon: [name], MD;  Location: [facility];  Service: Orthopedics;  Laterality: Right;  . RECTOCELE REPAIR    . TONSILLECTOMY    . TOTAL KNEE ARTHROPLASTY Right 04/07/2013   Procedure: RIGHT TOTAL KNEE ARTHROPLASTY;  Surgeon: [name], MD;  Location: [facility];  Service: Orthopedics;  Laterality: Right;    History reviewed. No pertinent family history. Social History    Socioeconomic History  . Marital status: Married    Spouse name: Not on file  . Number of children: Not on file  . Years of education: Not on file  . Highest education level: Not on file  Occupational History  . Not on file  Tobacco Use  . Smoking status: Never Smoker  . Smokeless tobacco: Never Used  Substance and Sexual Activity  . Alcohol use: No  . Drug use: No  . Sexual activity: Not on file  Other Topics Concern  . Not on file  Social History Narrative  . Not on file   Social Determinants of Health   Financial Resource Strain:   . Difficulty of Paying Living Expenses: Not on file  Food Insecurity:   . Worried About Charity fundraiser in the Last Year: Not on file  . Ran Out of Food in the Last Year: Not on file  Transportation Needs:   . Lack of Transportation (Medical): Not on file  . Lack of Transportation (Non-Medical): Not on file  Physical Activity:   . Days of Exercise per Week: Not on file  . Minutes of Exercise per Session: Not on file  Stress:   . Feeling of Stress : Not on file  Social Connections:   . Frequency of Communication with Friends and Family: Not on file  . Frequency of Social Gatherings with Friends and Family: Not on file  . Attends Religious Services: Not on file  . Active Member of Clubs or Organizations: Not on file  . Attends Archivist Meetings: Not on file  . Marital Status: Not on file    Review of Systems  Constitutional: Negative for chills, fatigue and fever.  HENT: Negative for congestion, ear pain, rhinorrhea and sore throat.   Respiratory: Negative for cough and shortness of breath.   Gastrointestinal: Negative for constipation, diarrhea, nausea and vomiting.  Endocrine: Negative for polydipsia, polyphagia and polyuria.  Genitourinary: Negative for dysuria and urgency.  Neurological: Positive for headaches. Negative for weakness.  Psychiatric/Behavioral: Negative for dysphoric mood. The patient is not  nervous/anxious.      Objective:  BP 130/80   Pulse 80   Temp (!) 97.4 F (36.3 C)   Resp 18   Ht 5\' 4"  (1.626 m)   Wt 182 lb 3.2 oz (82.6 kg)   SpO2 99%   BMI 31.27 kg/m   BP/Weight 07/22/2020 04/21/2020 8/58/8502  Systolic BP 774 128 786  Diastolic BP 80 78 78  Wt. (Lbs) 182.2 183.6 186  BMI 31.27 31.51 31.93    Physical Exam Vitals reviewed.  Constitutional:      Appearance: Normal appearance. She is normal weight.  Neck:     Vascular: No carotid bruit.  Cardiovascular:     Rate and Rhythm: Normal rate and regular rhythm.     Pulses: Normal pulses.  Heart sounds: Normal heart sounds.  Pulmonary:     Effort: Pulmonary effort is normal.     Breath sounds: Normal breath sounds.  Abdominal:     General: Bowel sounds are normal.     Palpations: Abdomen is soft.     Tenderness: There is no abdominal tenderness.  Neurological:     Mental Status: She is alert and oriented to person, place, and time.  Psychiatric:        Mood and Affect: Mood normal.        Behavior: Behavior normal.     Diabetic Foot Exam - Simple   Simple Foot Form Diabetic Foot exam was performed with the following findings: Yes 07/22/2020  2:13 PM  Visual Inspection No deformities, no ulcerations, no other skin breakdown bilaterally: Yes Sensation Testing Intact to touch and monofilament testing bilaterally: Yes Pulse Check Posterior Tibialis and Dorsalis pulse intact bilaterally: Yes Comments      Lab Results  Component Value Date   WBC 5.8 07/19/2020   HGB 13.9 07/19/2020   HCT 42.8 07/19/2020   PLT 425 07/19/2020   GLUCOSE 133 (H) 07/19/2020   CHOL 170 07/19/2020   TRIG 175 (H) 07/19/2020   HDL 48 07/19/2020   LDLCALC 92 07/19/2020   ALT 16 07/19/2020   AST 17 07/19/2020   NA 137 07/19/2020   K 4.1 07/19/2020   CL 97 07/19/2020   CREATININE 0.91 07/19/2020   BUN 19 07/19/2020   CO2 24 07/19/2020   INR 0.92 04/01/2013   HGBA1C 7.7 (H) 07/19/2020   MICROALBUR 30  04/21/2020      Assessment & Plan:   1. Essential hypertension, benign Well controlled usually. Pt was stressed out today. Returning in 1 month for CPE will recheck bp then. .  No changes to medicines.  Continue to work on eating a healthy diet and exercise.  Labs drawn today.  - Comprehensive metabolic panel; Future - CBC with Differential/Platelet; Future  2. Mixed hyperlipidemia Well controlled.  No changes to medicines.  Continue to work on eating a healthy diet and exercise.  Labs drawn today.  - Lipid panel; Future  3. Dyslipidemia associated with type 2 diabetes mellitus ([place]) Control: worsened, but just started new medicine. Now off prednisone.  Recommend check sugars fasting daily. Recommend check feet daily. Recommend annual eye exams. Medicines: no changes. Continue to work on eating a healthy diet and exercise.  Labs drawn today.   - Hemoglobin A1c; Future  4. Migraine without aura and without status migrainosus, not intractable Well controlled.  Using imitrex prn.   5. Primary insomnia The current medical regimen is effective;  continue present plan and medications.  6. Gastroesophageal reflux disease without esophagitis The current medical regimen is effective;  continue present plan and medications.  7. BMI 30.0-30.9,adult  Recommend continue to work on eating healthy diet and exercise.    Orders Placed This Encounter  Procedures  . Hemoglobin A1c  . Lipid panel  . Comprehensive metabolic panel  . CBC with Differential/Platelet     I spent 30 minutes dedicated to the care of this patient on the date of this encounter to include face-to-face time with the patient, as well as: Preparing to see the patient (review of labs.) Obtaining and/or reviewing separately obtained history. Performing a medically appropriate examination and/or evaluation. Counseling and educating the patient. Ordering medications, tests. Documenting clinical information in  the electronic or other health record.   My nursing staff have aided in the  documentation of this note on the behalf of Rochel Brome, MD,as directed by  Rochel Brome, MD and thoroughly reviewed by Rochel Brome, MD.  Follow-up: Return in about 3 months (around 10/22/2020).  An After Visit Summary was printed and given to the patient.  Rochel Brome, MD [facility] [phone]

## 2020-07-26 ENCOUNTER — Other Ambulatory Visit: Payer: Self-pay | Admitting: Family Medicine

## 2020-08-04 ENCOUNTER — Ambulatory Visit (INDEPENDENT_AMBULATORY_CARE_PROVIDER_SITE_OTHER): Payer: BC Managed Care – PPO | Admitting: Family Medicine

## 2020-08-04 ENCOUNTER — Other Ambulatory Visit: Payer: Self-pay

## 2020-08-04 ENCOUNTER — Encounter: Payer: Self-pay | Admitting: Family Medicine

## 2020-08-04 VITALS — BP 122/82 | HR 71 | Temp 93.5°F | Ht 64.0 in | Wt 181.0 lb

## 2020-08-04 DIAGNOSIS — E669 Obesity, unspecified: Secondary | ICD-10-CM | POA: Diagnosis not present

## 2020-08-04 DIAGNOSIS — Z0000 Encounter for general adult medical examination without abnormal findings: Secondary | ICD-10-CM | POA: Diagnosis not present

## 2020-08-04 DIAGNOSIS — Z8543 Personal history of malignant neoplasm of ovary: Secondary | ICD-10-CM

## 2020-08-04 NOTE — Progress Notes (Signed)
Subjective:  Patient ID: Lindsey [name], female    DOB: June 13, 1956  Age: 64 y.o. MRN: [id number]  Chief Complaint  Patient presents with  . Annual Exam    HPI Encounter for general adult medical examination without abnormal findings  Physical ("At Risk" items are starred): Patient's last physical exam was 1 year ago .  Smoking: Life-long non-smoker ;  Physical Activity: Very active; but does not do any formal exercise.  Patient does not enjoy exercising. Eats healthy.  Has improved her diet since her last visit as her A1c had increased (couple weeks ago). Alcohol/Drug Use: Drinks maybe once a month; No illicit drug use ;  Patient is not afflicted from Stress Incontinence and Urge Incontinence  Safety: reviewed ; Patient wears a seat belt, has smoke detectors, has carbon monoxide detectors, practices appropriate gun safety, and wears sunscreen with extended sun exposure. Dental Care: biannual cleanings, brushes and flosses daily. Ophthalmology/Optometry: Annual visit.  Hearing loss: none Vision impairments: Wears glasses  MAMMOFINDINGS 01/13/20  Colonoscopy 01/14/2009 Cologuard September 10, 2018.  Negative.  Patient has a history of TAH/BSO in January 2011.  Findings consistent with a ruptured 10 cm right ovarian mass.  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  The patient was last seen by [facility] gynecologic oncology in 2017.  At which time an inhibin level was drawn which was normal.  She then saw Dr. Modesta Messing in 2018.  At that time pelvic exam was done however no Pap smear was done as was not warranted per guidelines.  Dr. Modesta Messing did draw a serum inhibin level which was normal.    Fall Risk  08/04/2020  Falls in the past year? 0  Number falls in past yr: 0  Injury with Fall? 0  Follow up Falls evaluation completed     Depression screen [facility] 2/9 08/04/2020 10/26/2019  Decreased Interest 0 0  Down, Depressed, Hopeless 0 0  PHQ - 2 Score 0 0        Functional Status Survey: Is the patient deaf or have difficulty hearing?: No Does the patient have difficulty seeing, even when wearing glasses/contacts?: No Does the patient have difficulty concentrating, remembering, or making decisions?: No Does the patient have difficulty walking or climbing stairs?: No Does the patient have difficulty dressing or bathing?: No Does the patient have difficulty doing errands alone such as visiting a doctor's office or shopping?: No   Social Hx   Social History   Socioeconomic History  . Marital status: Married    Spouse name: Not on file  . Number of children: Not on file  . Years of education: Not on file  . Highest education level: Not on file  Occupational History  . Not on file  Tobacco Use  . Smoking status: Never Smoker  . Smokeless tobacco: Never Used  Substance and Sexual Activity  . Alcohol use: No  . Drug use: No  . Sexual activity: Not on file  Other Topics Concern  . Not on file  Social History Narrative  . Not on file   Social Determinants of Health   Financial Resource Strain:   . Difficulty of Paying Living Expenses: Not on file  Food Insecurity:   . Worried About Charity fundraiser in the Last Year: Not on file  . Ran Out of Food in the Last Year: Not on file  Transportation Needs:   . Lack of Transportation (Medical): Not on file  . Lack of  Transportation (Non-Medical): Not on file  Physical Activity:   . Days of Exercise per Week: Not on file  . Minutes of Exercise per Session: Not on file  Stress:   . Feeling of Stress : Not on file  Social Connections:   . Frequency of Communication with Friends and Family: Not on file  . Frequency of Social Gatherings with Friends and Family: Not on file  . Attends Religious Services: Not on file  . Active Member of Clubs or Organizations: Not on file  . Attends Archivist Meetings: Not on file  . Marital Status: Not on file   Past Medical History:  Diagnosis  Date  . Arthritis   . Cancer ([place]) 2011   granulosis ovary  . Diabetes mellitus without complication ([place])   . Frequent UTI     h/o since hysterectomy/04/01/13 asymptomatic  . GERD (gastroesophageal reflux disease)   . Headache(784.0)    migraines  . Hypertension    History reviewed. No pertinent family history.  Review of Systems  Constitutional: Negative for chills, fatigue and fever.  HENT: Negative for congestion, ear pain, rhinorrhea and sore throat.   Respiratory: Negative for cough and shortness of breath.   Cardiovascular: Negative for chest pain.  Gastrointestinal: Negative for abdominal pain, constipation, diarrhea, nausea and vomiting.  Genitourinary: Negative for dysuria and urgency.  Musculoskeletal: Positive for arthralgias (Left hip). Negative for back pain and myalgias.  Neurological: Negative for dizziness, weakness, light-headedness and headaches.  Psychiatric/Behavioral: Negative for dysphoric mood. The patient is not nervous/anxious.      Objective:  BP 122/82   Pulse 71   Temp (!) 93.5 F (34.2 C)   Ht 5\' 4"  (1.626 m)   Wt 181 lb (82.1 kg)   SpO2 100%   BMI 31.07 kg/m   BP/Weight 08/04/2020 07/22/2020 3/42/8768  Systolic BP 115 726 203  Diastolic BP 82 80 78  Wt. (Lbs) 181 182.2 183.6  BMI 31.07 31.27 31.51    Physical Exam Vitals reviewed.  Constitutional:      Appearance: Normal appearance. She is normal weight.  HENT:     Right Ear: Tympanic membrane normal.     Left Ear: Tympanic membrane normal.     Nose: Nose normal.     Mouth/Throat:     Mouth: Mucous membranes are moist.     Pharynx: No oropharyngeal exudate.  Neck:     Vascular: No carotid bruit.  Cardiovascular:     Rate and Rhythm: Normal rate and regular rhythm.     Pulses: Normal pulses.     Heart sounds: Normal heart sounds.  Pulmonary:     Effort: Pulmonary effort is normal. No respiratory distress.     Breath sounds: Normal breath sounds.  Abdominal:     General:  Abdomen is flat. Bowel sounds are normal.     Palpations: Abdomen is soft.     Tenderness: There is no abdominal tenderness.  Skin:    Findings: No lesion or rash.  Neurological:     Mental Status: She is alert and oriented to person, place, and time.  Psychiatric:        Mood and Affect: Mood normal.        Behavior: Behavior normal.     Lab Results  Component Value Date   WBC 5.8 07/19/2020   HGB 13.9 07/19/2020   HCT 42.8 07/19/2020   PLT 425 07/19/2020   GLUCOSE 133 (H) 07/19/2020   CHOL 170 07/19/2020   TRIG  175 (H) 07/19/2020   HDL 48 07/19/2020   LDLCALC 92 07/19/2020   ALT 16 07/19/2020   AST 17 07/19/2020   NA 137 07/19/2020   K 4.1 07/19/2020   CL 97 07/19/2020   CREATININE 0.91 07/19/2020   BUN 19 07/19/2020   CO2 24 07/19/2020   INR 0.92 04/01/2013   HGBA1C 7.7 (H) 07/19/2020   MICROALBUR 30 04/21/2020      Assessment & Plan:  1. Routine general medical examination at a health care facility Recommend continue to work on eating healthy diet and exercise.  2. Obesity, Class I, BMI 30-34.9 Work on diet and exercise  3. 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 No Pap smear needed per [place] preventative services guidelines.  This is a list of the screening recommended for you and due dates:  Health Maintenance  Topic Date Due  .  Hepatitis C: One time screening is recommended by Center for Disease Control  (CDC) for  adults born from [birth year] through 1964.   Never done  . HIV Screening  Never done  . Hemoglobin A1C  01/16/2021  . Eye exam for diabetics  03/09/2021  . Pap Smear  07/15/2021  . Complete foot exam   07/22/2021  . Cologuard (Stool DNA test)  09/10/2021  . Mammogram  01/12/2022  . Tetanus Vaccine  05/11/2028  . Flu Shot  Completed  . Pneumococcal vaccine  Completed  . COVID-19 Vaccine  Completed     AN INDIVIDUALIZED CARE PLAN: was established or  reinforced today.   SELF MANAGEMENT: The patient and I together assessed ways to personally work towards obtaining the recommended goals  Support needs The patient and/or family needs were assessed and services were offered and not necessary at this time.    Follow-up: No follow-ups on file.  Rochel Brome, MD [facility] [phone]

## 2020-08-04 NOTE — Patient Instructions (Addendum)
Lindsey [name]  Preventive Care 70-63 Years Old, Female Preventive care refers to visits with your health care provider and lifestyle choices that can promote health and wellness. This includes:  A yearly physical exam. This may also be called an annual well check.  Regular dental visits and eye exams.  Immunizations.  Screening for certain conditions.  Healthy lifestyle choices, such as eating a healthy diet, getting regular exercise, not using drugs or products that contain nicotine and tobacco, and limiting alcohol use. What can I expect for my preventive care visit? Physical exam Your health care provider will check your:  Height and weight. This may be used to calculate body mass index (BMI), which tells if you are at a healthy weight.  Heart rate and blood pressure.  Skin for abnormal spots. Counseling Your health care provider may ask you questions about your:  Alcohol, tobacco, and drug use.  Emotional well-being.  Home and relationship well-being.  Sexual activity.  Eating habits.  Work and work Statistician.  Method of birth control.  Menstrual cycle.  Pregnancy history. What immunizations do I need?  Influenza (flu) vaccine  This is recommended every year. Tetanus, diphtheria, and pertussis (Tdap) vaccine  You may need a Td booster every 10 years. Varicella (chickenpox) vaccine  You may need this if you have not been vaccinated. Zoster (shingles) vaccine  You may need this after age 21. Measles, mumps, and rubella (MMR) vaccine  You may need at least one dose of MMR if you were born in 1957 or later. You may also need a second dose. Pneumococcal conjugate (PCV13) vaccine  You may need this if you have certain conditions and were not previously vaccinated. Pneumococcal polysaccharide (PPSV23) vaccine  You may need one or two doses if you smoke cigarettes or if you have certain conditions. Meningococcal conjugate (MenACWY)  vaccine  You may need this if you have certain conditions. Hepatitis A vaccine  You may need this if you have certain conditions or if you travel or work in places where you may be exposed to hepatitis A. Hepatitis B vaccine  You may need this if you have certain conditions or if you travel or work in places where you may be exposed to hepatitis B. Haemophilus influenzae type b (Hib) vaccine  You may need this if you have certain conditions. Human papillomavirus (HPV) vaccine  If recommended by your health care provider, you may need three doses over 6 months. You may receive vaccines as individual doses or as more than one vaccine together in one shot (combination vaccines). Talk with your health care provider about the risks and benefits of combination vaccines. What tests do I need? Blood tests  Lipid and cholesterol levels. These may be checked every 5 years, or more frequently if you are over 10 years old.  Hepatitis C test.  Hepatitis B test. Screening  Lung cancer screening. You may have this screening every year starting at age 31 if you have a 30-pack-year history of smoking and currently smoke or have quit within the past 15 years.  Colorectal cancer screening. All adults should have this screening starting at age 9 and continuing until age 69. Your health care provider may recommend screening at age 21 if you are at increased risk. You will have tests every 1-10 years, depending on your results and the type of screening test.  Diabetes screening. This is done by checking your blood sugar (glucose) after you have not eaten for a while (fasting). You may  have this done every 1-3 years.  Mammogram. This may be done every 1-2 years. Talk with your health care provider about when you should start having regular mammograms. This may depend on whether you have a family history of breast cancer.  BRCA-related cancer screening. This may be done if you have a family history of  breast, ovarian, tubal, or peritoneal cancers.  Pelvic exam and Pap test. This may be done every 3 years starting at age 13. Starting at age 58, this may be done every 5 years if you have a Pap test in combination with an HPV test. Other tests  Sexually transmitted disease (STD) testing.  Bone density scan. This is done to screen for osteoporosis. You may have this scan if you are at high risk for osteoporosis. Follow these instructions at home: Eating and drinking  Eat a diet that includes fresh fruits and vegetables, whole grains, lean protein, and low-fat dairy.  Take vitamin and mineral supplements as recommended by your health care provider.  Do not drink alcohol if: ? Your health care provider tells you not to drink. ? You are pregnant, may be pregnant, or are planning to become pregnant.  If you drink alcohol: ? Limit how much you have to 0-1 drink a day. ? Be aware of how much alcohol is in your drink. In the [place] one drink equals one 12 oz bottle of beer (355 mL), one 5 oz glass of wine (148 mL), or one 1 oz glass of hard liquor (44 mL). Lifestyle  Take daily care of your teeth and gums.  Stay active. Exercise for at least 30 minutes on 5 or more days each week.  Do not use any products that contain nicotine or tobacco, such as cigarettes, e-cigarettes, and chewing tobacco. If you need help quitting, ask your health care provider.  If you are sexually active, practice safe sex. Use a condom or other form of birth control (contraception) in order to prevent pregnancy and STIs (sexually transmitted infections).  If told by your health care provider, take low-dose aspirin daily starting at age 76. What's next?  Visit your health care provider once a year for a well check visit.  Ask your health care provider how often you should have your eyes and teeth checked.  Stay up to date on all vaccines. This information is not intended to replace advice given to you by your  health care provider. Make sure you discuss any questions you have with your health care provider. Document Revised: 05/02/2018 Document Reviewed: 05/02/2018 Elsevier Patient Education  2020 [place].   Preventing Osteoporosis, Adult Osteoporosis is a condition that causes the bones to lose density. This means that the bones become thinner, and the normal spaces in bone tissue become larger. Low bone density can make the bones weak and cause them to break more easily. Osteoporosis cannot always be prevented, but you can take steps to lower your risk of developing this condition. How can this condition affect me? If you develop osteoporosis, you will be more likely to break bones in your wrist, spine, or hip. Even a minor accident or injury can be enough to break weak bones. The bones will also be slower to heal. Osteoporosis can cause other problems as well, such as a stooped posture or trouble with movement. Osteoporosis can occur with aging. As you get older, you may lose bone tissue more quickly, or it may be replaced more slowly. Osteoporosis is more likely to develop if  you have poor nutrition or do not get enough calcium or vitamin D. Other lifestyle factors can also play a role. By eating a well-balanced diet and making lifestyle changes, you can help keep your bones strong and healthy, lowering your chances of developing osteoporosis. What can increase my risk? The following factors may make you more likely to develop osteoporosis:  Having a family history of the condition.  Having poor nutrition or not getting enough calcium or vitamin D.  Using certain medicines, such as steroid medicines or antiseizure medicines.  Being any of the following: ? 40 years of age or older. ? Female. ? A woman who has gone through menopause (is postmenopausal). ? White (Caucasian) or of Asian descent.  Smoking or having a history of smoking.  Not being physically active (being  sedentary).  Having a small body frame. What actions can I take to prevent this?  Get enough calcium   Make sure you get enough calcium every day. Calcium is the most important mineral for bone health. Most people can get enough calcium from their diet, but supplements may be recommended for people who are at risk for osteoporosis. Follow these guidelines: ? If you are age 77 or younger, aim to get 1,000 mg of calcium every day. ? If you are older than age 39, aim to get 1,200 mg of calcium every day.  Good sources of calcium include: ? Dairy products, such as low-fat or nonfat milk, cheese, and yogurt. ? Dark green leafy vegetables, such as bok choy and broccoli. ? Foods that have had calcium added to them (calcium-fortified foods), such as orange juice, cereal, bread, soy beverages, and tofu products. ? Nuts, such as almonds.  Check nutrition labels to see how much calcium is in a food or drink. Get enough vitamin D  Try to get enough vitamin D every day. Vitamin D is the most essential vitamin for bone health. It helps the body absorb calcium. Follow these guidelines for how much vitamin D to get from food: ? If you are age 61 or younger, aim to get at least 600 international units (IU) every day. Your health care provider may suggest more. ? If you are older than age 78, aim to get at least 800 international units every day. Your health care provider may suggest more.  Good sources of vitamin D in your diet include: ? Egg yolks. ? Oily fish, such as salmon, sardines, and tuna. ? Milk and cereal fortified with vitamin D.  Your body also makes vitamin D when you are out in the sun. Exposing the bare skin on your face, arms, legs, or back to the sun for no more than 30 minutes a day, 2 times a week is more than enough. Beyond that, make sure you use sunblock to protect your skin from sunburn, which increases your risk for skin cancer. Exercise  Stay active and get exercise every  day.  Ask your health care provider what types of exercise are best for you. Weight-bearing and strength-building activities are important for building and maintaining healthy bones. Some examples of these types of activities include: ? Walking and hiking. ? Jogging and running. ? Dancing. ? Gym exercises. ? Lifting weights. ? Tennis and racquetball. ? Climbing stairs. ? Aerobics. Make other lifestyle changes  Do not use any products that contain nicotine or tobacco, such as cigarettes, e-cigarettes, and chewing tobacco. If you need help quitting, ask your health care provider.  Lose weight if you are  overweight.  If you drink alcohol: ? Limit how much you use to:  0-1 drink a day for nonpregnant women.  0-2 drinks a day for men. ? Be aware of how much alcohol is in your drink. In the [place] one drink equals one 12 oz bottle of beer (355 mL), one 5 oz glass of wine (148 mL), or one 1 oz glass of hard liquor (44 mL). Where to find support If you need help making changes to prevent osteoporosis, talk with your health care provider. You can ask for a referral to a diet and nutrition specialist (dietitian) and a physical therapist. Where to find more information Learn more about osteoporosis from:  NIH Osteoporosis and Related [place]: www.bones.SouthExposed.es  [place] Office on [employer] Health: [place].tn  [place]: EquipmentWeekly.com.ee Summary  Osteoporosis is a condition that causes weak bones that are more likely to break.  Eat a healthy diet, making sure you get enough calcium and vitamin D, and stay active by getting regular exercise to help prevent osteoporosis.  Other ways to reduce your risk of osteoporosis include maintaining a healthy weight and avoiding alcohol and products that contain nicotine or tobacco. This information is not intended to replace advice given to you by your health care provider. Make sure you discuss  any questions you have with your health care provider. Document Revised: 03/21/2019 Document Reviewed: 03/21/2019 Elsevier Patient Education  [place].

## 2020-08-14 DIAGNOSIS — E119 Type 2 diabetes mellitus without complications: Secondary | ICD-10-CM | POA: Diagnosis not present

## 2020-08-14 DIAGNOSIS — N3091 Cystitis, unspecified with hematuria: Secondary | ICD-10-CM | POA: Diagnosis not present

## 2020-08-14 DIAGNOSIS — R809 Proteinuria, unspecified: Secondary | ICD-10-CM | POA: Diagnosis not present

## 2020-08-14 DIAGNOSIS — R81 Glycosuria: Secondary | ICD-10-CM | POA: Diagnosis not present

## 2020-08-24 ENCOUNTER — Telehealth: Payer: BC Managed Care – PPO | Admitting: Adult Health

## 2020-09-26 ENCOUNTER — Other Ambulatory Visit: Payer: Self-pay | Admitting: Family Medicine

## 2020-10-25 ENCOUNTER — Other Ambulatory Visit: Payer: BC Managed Care – PPO

## 2020-10-25 ENCOUNTER — Other Ambulatory Visit: Payer: Self-pay

## 2020-10-25 DIAGNOSIS — E782 Mixed hyperlipidemia: Secondary | ICD-10-CM

## 2020-10-25 DIAGNOSIS — I10 Essential (primary) hypertension: Secondary | ICD-10-CM

## 2020-10-25 DIAGNOSIS — E1169 Type 2 diabetes mellitus with other specified complication: Secondary | ICD-10-CM | POA: Diagnosis not present

## 2020-10-25 DIAGNOSIS — E785 Hyperlipidemia, unspecified: Secondary | ICD-10-CM | POA: Diagnosis not present

## 2020-10-26 LAB — CBC WITH DIFFERENTIAL/PLATELET
Basophils Absolute: 0.1 10*3/uL (ref 0.0–0.2)
Basos: 1 %
EOS (ABSOLUTE): 0.2 10*3/uL (ref 0.0–0.4)
Eos: 4 %
Hematocrit: 47.5 % — ABNORMAL HIGH (ref 34.0–46.6)
Hemoglobin: 15.2 g/dL (ref 11.1–15.9)
Immature Grans (Abs): 0 10*3/uL (ref 0.0–0.1)
Immature Granulocytes: 1 %
Lymphocytes Absolute: 1.9 10*3/uL (ref 0.7–3.1)
Lymphs: 31 %
MCH: 27.6 pg (ref 26.6–33.0)
MCHC: 32 g/dL (ref 31.5–35.7)
MCV: 86 fL (ref 79–97)
Monocytes Absolute: 0.5 10*3/uL (ref 0.1–0.9)
Monocytes: 9 %
Neutrophils Absolute: 3.3 10*3/uL (ref 1.4–7.0)
Neutrophils: 54 %
Platelets: 412 10*3/uL (ref 150–450)
RBC: 5.51 x10E6/uL — ABNORMAL HIGH (ref 3.77–5.28)
RDW: 13.8 % (ref 11.7–15.4)
WBC: 5.9 10*3/uL (ref 3.4–10.8)

## 2020-10-26 LAB — LIPID PANEL
Chol/HDL Ratio: 3.1 ratio (ref 0.0–4.4)
Cholesterol, Total: 158 mg/dL (ref 100–199)
HDL: 51 mg/dL (ref >39)
LDL Chol Calc (NIH): 72 mg/dL (ref 0–99)
Triglycerides: 214 mg/dL — ABNORMAL HIGH (ref 0–149)
VLDL Cholesterol Cal: 35 mg/dL (ref 5–40)

## 2020-10-26 LAB — COMPREHENSIVE METABOLIC PANEL
ALT: 21 IU/L (ref 0–32)
AST: 22 IU/L (ref 0–40)
Albumin/Globulin Ratio: 1.8 (ref 1.2–2.2)
Albumin: 4.7 g/dL (ref 3.8–4.8)
Alkaline Phosphatase: 78 IU/L (ref 44–121)
BUN/Creatinine Ratio: 19 (ref 12–28)
BUN: 18 mg/dL (ref 8–27)
Bilirubin Total: 0.5 mg/dL (ref 0.0–1.2)
CO2: 21 mmol/L (ref 20–29)
Calcium: 9.9 mg/dL (ref 8.7–10.3)
Chloride: 95 mmol/L — ABNORMAL LOW (ref 96–106)
Creatinine, Ser: 0.97 mg/dL (ref 0.57–1.00)
GFR calc Af Amer: 72 mL/min/{1.73_m2} (ref >59)
GFR calc non Af Amer: 62 mL/min/{1.73_m2} (ref >59)
Globulin, Total: 2.6 g/dL (ref 1.5–4.5)
Glucose: 142 mg/dL — ABNORMAL HIGH (ref 65–99)
Potassium: 4.2 mmol/L (ref 3.5–5.2)
Sodium: 134 mmol/L (ref 134–144)
Total Protein: 7.3 g/dL (ref 6.0–8.5)

## 2020-10-26 LAB — HEMOGLOBIN A1C
Est. average glucose Bld gHb Est-mCnc: 174 mg/dL
Hgb A1c MFr Bld: 7.7 % — ABNORMAL HIGH (ref 4.8–5.6)

## 2020-10-26 LAB — CARDIOVASCULAR RISK ASSESSMENT

## 2020-10-27 ENCOUNTER — Encounter: Payer: Self-pay | Admitting: Family Medicine

## 2020-10-28 ENCOUNTER — Other Ambulatory Visit: Payer: Self-pay

## 2020-10-28 ENCOUNTER — Ambulatory Visit: Payer: BC Managed Care – PPO | Admitting: Family Medicine

## 2020-10-28 VITALS — BP 124/78 | HR 87 | Temp 95.9°F | Ht 64.0 in | Wt 181.0 lb

## 2020-10-28 DIAGNOSIS — E6609 Other obesity due to excess calories: Secondary | ICD-10-CM

## 2020-10-28 DIAGNOSIS — E669 Obesity, unspecified: Secondary | ICD-10-CM

## 2020-10-28 DIAGNOSIS — E1169 Type 2 diabetes mellitus with other specified complication: Secondary | ICD-10-CM | POA: Diagnosis not present

## 2020-10-28 DIAGNOSIS — Z6831 Body mass index (BMI) 31.0-31.9, adult: Secondary | ICD-10-CM

## 2020-10-28 DIAGNOSIS — F419 Anxiety disorder, unspecified: Secondary | ICD-10-CM

## 2020-10-28 DIAGNOSIS — K219 Gastro-esophageal reflux disease without esophagitis: Secondary | ICD-10-CM | POA: Diagnosis not present

## 2020-10-28 DIAGNOSIS — E785 Hyperlipidemia, unspecified: Secondary | ICD-10-CM

## 2020-10-28 DIAGNOSIS — I10 Essential (primary) hypertension: Secondary | ICD-10-CM | POA: Diagnosis not present

## 2020-10-28 DIAGNOSIS — F411 Generalized anxiety disorder: Secondary | ICD-10-CM

## 2020-10-28 MED ORDER — BUPROPION HCL ER (XL) 150 MG PO TB24: 150.0000 mg | ORAL_TABLET | Freq: Every day | ORAL | 2 refills | Status: DC

## 2020-10-28 NOTE — Progress Notes (Signed)
Subjective:  Patient ID: Lindsey [name], female    DOB: 1956/01/04  Age: 65 y.o. MRN: [id number]  Chief Complaint  Patient presents with  . Hypertension  . Hyperlipidemia  . Diabetes    HPI Essential hypertension, benign Takes lisinopril-HCTZ 20/12.5 mg once daily.   Dyslipidemia associated with type 2 diabetes mellitus ([place]) Synjardy xr 12.5 mg two daily, pravastatin 10 mg once daily, lovaza 1 gm 2 capsules twice a day.  Patient checks her sugars daily. Sugars 140s. Checks feet daily. Has been trying to eating healthy, but is an emotional eater and is under a lot of stress.  Gastroesophageal reflux disease without esophagitis Controlled with prilosec 20 mg once daily.  Anxiety: Requesting medicine which will not cause weight gain. Is not Controlled with ativan Does not feel depression.  Insomnia-Taking Ambien 10 mg once daily at night. Helps her sleep.   Migraines: pt has not had a recurrence of migraines since coming off AIMOVIG.   Current Outpatient Medications on File Prior to Visit  Medication Sig Dispense Refill  . aspirin EC 81 MG tablet Take 81 mg by mouth every morning.    . Calcium Carb-Cholecalciferol (CALCIUM 600 + D PO) Take 1 tablet by mouth at bedtime.    Marland Kitchen lisinopril-hydrochlorothiazide (ZESTORETIC) 20-25 MG tablet TAKE 1 TABLET BY MOUTH  DAILY 90 tablet 3  . LORazepam (ATIVAN) 0.5 MG tablet TAKE 1 TABLET BY MOUTH ONCE DAILY AS NEEDED FOR ANXIETY. 30 tablet 1  . omega-3 acid ethyl esters (LOVAZA) 1 g capsule Take 2 capsules (2 g total) by mouth 2 (two) times daily. 360 capsule 1  . omeprazole (PRILOSEC) 20 MG capsule TAKE 1 CAPSULE BY MOUTH ONCE DAILY BEFORE A MEAL 90 capsule 3  . pravastatin (PRAVACHOL) 10 MG tablet TAKE 1 TABLET BY MOUTH ONCE DAILY 90 tablet 3  . SUMAtriptan (IMITREX) 100 MG tablet Take 100 mg by mouth every 2 (two) hours as needed for migraine.    Marland Kitchen SYNJARDY XR 12.01-999 MG TB24 TAKE 2 TABLETS BY MOUTH  DAILY 180 tablet 3  . tizanidine  (ZANAFLEX) 2 MG capsule Take 1 capsule (2 mg total) by mouth daily as needed for muscle spasms. 90 capsule 1  . zolpidem (AMBIEN CR) 12.5 MG CR tablet TAKE 1 TABLET BY MOUTH AT BEDTIME 90 tablet 1   No current facility-administered medications on file prior to visit.   Past Medical History:  Diagnosis Date  . Arthritis   . 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 Diabetes mellitus without complication ([place])   . Frequent UTI     h/o since hysterectomy/04/01/13 asymptomatic  . GERD (gastroesophageal reflux disease)   . Headache(784.0)    migraines  . Hypertension    Past Surgical History:  Procedure Laterality Date  . ABDOMINAL HYSTERECTOMY  2014   Stromal ovarian cancer  . CHOLECYSTECTOMY    . ENTEROCELE REPAIR    . KNEE ARTHROSCOPY Right   . KNEE ARTHROSCOPY Right 07/01/2014   Procedure: RIGHT ARTHROSCOPY KNEE WITH SYNOVECTOMY;  Surgeon: [name], MD;  Location: [facility];  Service: Orthopedics;  Laterality: Right;  . RECTOCELE REPAIR    . TONSILLECTOMY    . TOTAL KNEE ARTHROPLASTY Right 04/07/2013   Procedure: RIGHT TOTAL KNEE ARTHROPLASTY;  Surgeon: [name], MD;  Location: [facility];  Service: Orthopedics;  Laterality: Right;    History reviewed. No pertinent family history. Social History   Socioeconomic History  . Marital status: Married    Spouse  name: Not on file  . Number of children: Not on file  . Years of education: Not on file  . Highest education level: Not on file  Occupational History  . Not on file  Tobacco Use  . Smoking status: Never Smoker  . Smokeless tobacco: Never Used  Substance and Sexual Activity  . Alcohol use: No  . Drug use: No  . Sexual activity: Not on file  Other Topics Concern  . Not on file  Social History Narrative  . Not on file   Social Determinants of Health   Financial Resource Strain: Not on file  Food Insecurity: Not on file  Transportation Needs: Not on file  Physical Activity: Not on file  Stress: Not on file   Social Connections: Not on file    Review of Systems  Constitutional: Negative for chills, fatigue and fever.  HENT: Positive for congestion, sinus pressure and sinus pain. Negative for ear pain, postnasal drip, rhinorrhea and sore throat.   Respiratory: Negative for cough and shortness of breath.   Cardiovascular: Negative for chest pain.  Gastrointestinal: Negative for abdominal pain, constipation, diarrhea, nausea and vomiting.  Genitourinary: Negative for dysuria and urgency.  Musculoskeletal: Negative for back pain and myalgias.  Neurological: Negative for dizziness, weakness, light-headedness and headaches.  Psychiatric/Behavioral: Negative for dysphoric mood. The patient is nervous/anxious.      Objective:  BP 124/78   Pulse 87   Temp (!) 95.9 F (35.5 C)   Ht 5\' 4"  (1.626 m)   Wt 181 lb (82.1 kg)   SpO2 94%   BMI 31.07 kg/m   BP/Weight 10/28/2020 08/04/2020 94/17/4081  Systolic BP 448 185 631  Diastolic BP 78 82 80  Wt. (Lbs) 181 181 182.2  BMI 31.07 31.07 31.27    Physical Exam Vitals reviewed.  Constitutional:      Appearance: Normal appearance.  HENT:     Right Ear: Tympanic membrane normal.     Left Ear: Tympanic membrane normal.     Nose: Congestion present. No rhinorrhea.     Mouth/Throat:     Pharynx: No oropharyngeal exudate or posterior oropharyngeal erythema.  Neck:     Vascular: No carotid bruit.  Cardiovascular:     Rate and Rhythm: Normal rate and regular rhythm.     Pulses: Normal pulses.     Heart sounds: Normal heart sounds.  Pulmonary:     Effort: Pulmonary effort is normal.     Breath sounds: Normal breath sounds.  Abdominal:     General: Bowel sounds are normal.     Palpations: Abdomen is soft.     Tenderness: There is no abdominal tenderness.  Feet:     Right foot:     Skin integrity: Skin integrity normal.     Left foot:     Skin integrity: Skin integrity normal.  Neurological:     Mental Status: She is alert and oriented to  person, place, and time.  Psychiatric:        Mood and Affect: Affect is tearful.        Behavior: Behavior normal.     Diabetic Foot Exam - Simple   Simple Foot Form Diabetic Foot exam was performed with the following findings: Yes 10/28/2020 12:43 PM  Visual Inspection No deformities, no ulcerations, no other skin breakdown bilaterally: Yes Sensation Testing Intact to touch and monofilament testing bilaterally: Yes Pulse Check Posterior Tibialis and Dorsalis pulse intact bilaterally: Yes Comments      Lab Results  Component Value Date   WBC 5.9 10/25/2020   HGB 15.2 10/25/2020   HCT 47.5 (H) 10/25/2020   PLT 412 10/25/2020   GLUCOSE 142 (H) 10/25/2020   CHOL 158 10/25/2020   TRIG 214 (H) 10/25/2020   HDL 51 10/25/2020   LDLCALC 72 10/25/2020   ALT 21 10/25/2020   AST 22 10/25/2020   NA 134 10/25/2020   K 4.2 10/25/2020   CL 95 (L) 10/25/2020   CREATININE 0.97 10/25/2020   BUN 18 10/25/2020   CO2 21 10/25/2020   INR 0.92 04/01/2013   HGBA1C 7.7 (H) 10/25/2020   MICROALBUR 30 04/21/2020      Assessment & Plan:   1. Dyslipidemia associated with type 2 diabetes mellitus ([place]) Continue synjardy xr 12.01/999 mg twice daily. Check sugars daily.  - Start on trulicity 9.39 mg once weekly x 2 weeks, then increase to 1.5 mg weekly. Follow up in 4 weeks.  Recommend continue to work on eating healthy diabetic diet and exercise. - Lipid panel; Future - Hemoglobin A1c; Future - CBC with Differential/Platelet; Future  2. Essential hypertension, benign The current medical regimen is effective;  continue present plan and medications. - Comprehensive metabolic panel; Future  3. Gastroesophageal reflux disease without esophagitis The current medical regimen is effective;  continue present plan and medications.  4. Obesity, Class I, BMI 30-34.9 Recommend continue to work on eating healthy diet and exercise. trulicity may help  5. GAD (generalized anxiety disorder) -  Start on wellbutrin xl 150 mg in am. Start in 1-2 weeks after starting trulicity.  Meds ordered this encounter  Medications  . buPROPion (WELLBUTRIN XL) 150 MG 24 hr tablet    Sig: Take 1 tablet (150 mg total) by mouth daily.    Dispense:  30 tablet    Refill:  2    Orders Placed This Encounter  Procedures  . Lipid panel  . Hemoglobin A1c  . Comprehensive metabolic panel  . CBC with Differential/Platelet    Follow-up: Return in about 4 weeks (around 11/25/2020).  An After Visit Summary was printed and given to the patient.  Rochel Brome, MD [facility] [phone]

## 2020-10-28 NOTE — Patient Instructions (Signed)
Diabetes: Start on trulicity 5.80 mg once weekly x 2 weeks, then increase to 1.5 mg weekly. Follow up in 4 weeks.   Anxiety:  Start on wellbutrin xl 150 mg in am. Start in 1-2 weeks after starting trulicity.

## 2020-10-31 ENCOUNTER — Encounter: Payer: Self-pay | Admitting: Family Medicine

## 2020-11-01 ENCOUNTER — Other Ambulatory Visit: Payer: Self-pay

## 2020-11-01 MED ORDER — SUMATRIPTAN SUCCINATE 100 MG PO TABS: 100.0000 mg | ORAL_TABLET | ORAL | 2 refills | Status: DC | PRN

## 2020-11-01 MED ORDER — OMEPRAZOLE 20 MG PO CPDR: DELAYED_RELEASE_CAPSULE | ORAL | 3 refills | Status: DC

## 2020-11-04 ENCOUNTER — Other Ambulatory Visit: Payer: Self-pay

## 2020-11-04 ENCOUNTER — Other Ambulatory Visit: Payer: Self-pay | Admitting: Family Medicine

## 2020-11-04 MED ORDER — SUMATRIPTAN SUCCINATE 100 MG PO TABS: ORAL_TABLET | ORAL | 1 refills | Status: DC

## 2020-11-12 ENCOUNTER — Other Ambulatory Visit: Payer: Self-pay | Admitting: Family Medicine

## 2020-11-17 ENCOUNTER — Telehealth: Payer: Self-pay

## 2020-11-17 NOTE — Telephone Encounter (Signed)
Lindsey [name] called to report that she is on her third week of Trulicity.  She has been experiencing a great deal of nausea and loss of appetite but her symptoms seem to be improving.  She is scheduled for her fourth injection later this week and follow-up in the office next week.  She is going to try to eat small amounts more frequently to see if her symptoms improve.

## 2020-11-24 NOTE — Progress Notes (Signed)
Subjective:  Patient ID: Lindsey [name], female    DOB: 08-03-1956  Age: 65 y.o. MRN: [id number]  Chief Complaint  Patient presents with  . Diabetes    HPI Dyslipidemia associated with type 2 diabetes mellitus (Lindsey [name]) Lindsey, trulicity 56.3 mg, pravastatin 10 mg once daily, Lindsey [name].  Patient checks her sugars daily. Sugars averaging 130s. Checks feet daily. Has been trying to eating healthy. Nauseated a lot. Unable to tolerate trulicity  Anxiety  Did not start Lindsey [name] because was so nauseated. Does not know that she needs Lindsey now.  Current Outpatient Medications on File Prior to Visit  Medication Sig Dispense Refill  . aspirin EC 81 MG tablet Take 81 mg by mouth every morning.    Marland Kitchen buPROPion (Lindsey [name]) 150 MG 24 hr tablet Take 1 tablet (150 mg total) by mouth daily. 30 tablet 2  . Calcium Carb-Cholecalciferol (CALCIUM 600 + D PO) Take 1 tablet by mouth at bedtime.    Marland Kitchen lisinopril-hydrochlorothiazide (Lindsey [name]) 20-25 MG tablet TAKE 1 TABLET BY MOUTH  DAILY 90 tablet 3  . LORazepam (Lindsey [name]) 0.5 MG tablet TAKE 1 TABLET BY MOUTH ONCE DAILY AS NEEDED FOR ANXIETY. 30 tablet 1  . omega-3 acid ethyl esters (Lindsey [name]) 1 g capsule Take 2 capsules (2 g total) by mouth 2 (two) times daily. 360 capsule 1  . omeprazole (Lindsey [name]) 20 MG capsule TAKE 1 CAPSULE BY MOUTH ONCE DAILY BEFORE A MEAL 90 capsule 3  . pravastatin (Lindsey [name]) 10 MG tablet TAKE 1 TABLET BY MOUTH ONCE DAILY 90 tablet 3  . SUMAtriptan (Lindsey [name]) 100 MG tablet One at onset of aura/migraine, repeat in 2 hours x 1 per 24 hours. 27 tablet 1  . Lindsey [name] 12.01-999 MG TB24 TAKE 2 TABLETS BY MOUTH  DAILY 180 tablet 3  . tizanidine (Lindsey [name]) 2 MG capsule Take 1 capsule (2 mg total) by mouth daily as needed for muscle spasms. 90 capsule 1  . zolpidem (Lindsey [name]) 12.5 MG [name] tablet TAKE 1 TABLET BY MOUTH AT BEDTIME 90 tablet 1   No current facility-administered medications on file prior to visit.   Past Medical  History:  Diagnosis Date  . Arthritis   . [name] (Lindsey [name]) 2011   granulosis ovary  . Diabetes mellitus without complication (Lindsey [name])   . Frequent UTI     h/o since hysterectomy/04/01/13 asymptomatic  . GERD (gastroesophageal reflux disease)   . Headache(784.0)    migraines  . Hypertension    Past Surgical History:  Procedure Laterality Date  . ABDOMINAL HYSTERECTOMY  2014   Lindsey [name]  . CHOLECYSTECTOMY    . ENTEROCELE REPAIR    . KNEE ARTHROSCOPY Right   . KNEE ARTHROSCOPY Right 07/01/2014   Procedure: RIGHT ARTHROSCOPY KNEE WITH SYNOVECTOMY;  Surgeon: Lindsey Alf, MD;  Location: [name];  Service: Orthopedics;  Laterality: Right;  . RECTOCELE REPAIR    . TONSILLECTOMY    . TOTAL KNEE ARTHROPLASTY Right 04/07/2013   Procedure: RIGHT TOTAL KNEE ARTHROPLASTY;  Surgeon: Lindsey Alf, MD;  Location: [name];  Service: Orthopedics;  Laterality: Right;    No family history on file. Social History   Socioeconomic History  . Marital status: Married    Spouse name: Not on file  . Number of children: Not on file  . Years of education: Not on file  . Highest education level: Not on file  Occupational History  . Not on file  Tobacco Use  . Smoking status: Never Smoker  .  Smokeless tobacco: Never Used  Substance and Sexual Activity  . Alcohol use: No  . Drug use: No  . Sexual activity: Not on file  Other Topics Concern  . Not on file  Social History Narrative  . Not on file   Social Determinants of Health   Financial Resource Strain: Not on file  Food Insecurity: Not on file  Transportation Needs: Not on file  Physical Activity: Not on file  Stress: Not on file  Social Connections: Not on file    Review of Systems  Constitutional: Positive for fatigue and unexpected weight change. Negative for chills and fever.  HENT: Negative for congestion, ear pain, rhinorrhea and sore throat.   Respiratory: Negative for cough and shortness of breath.   Cardiovascular:  Negative for chest pain.  Gastrointestinal: Positive for nausea. Negative for abdominal pain, constipation, diarrhea and vomiting.       Frequent heartburn  Genitourinary: Negative for dysuria and urgency.  Musculoskeletal: Negative for back pain and myalgias.  Neurological: Positive for headaches. Negative for dizziness, weakness and light-headedness.  Psychiatric/Behavioral: Negative for dysphoric mood. The patient is not nervous/anxious.      Objective:  BP 120/76   Pulse 60   Temp (!) 97.2 F (36.2 C)   Resp 16   Ht 5\' 4"  (1.626 m)   Wt 172 lb (78 kg)   BMI 29.52 kg/m   BP/Weight 11/25/2020 10/28/2020 44/0/1027  Systolic BP 253 664 403  Diastolic BP 76 78 82  Wt. (Lbs) 172 181 181  BMI 29.52 31.07 31.07    Physical Exam Vitals reviewed.  Constitutional:      Appearance: Normal appearance. She is normal weight.  Neck:     Vascular: No carotid bruit.  Cardiovascular:     Rate and Rhythm: Normal rate and regular rhythm.     Pulses: Normal pulses.     Heart sounds: Normal heart sounds.  Pulmonary:     Effort: Pulmonary effort is normal. No respiratory distress.     Breath sounds: Normal breath sounds.  Abdominal:     General: Abdomen is flat. Bowel sounds are normal.     Palpations: Abdomen is soft.     Tenderness: There is no abdominal tenderness.  Neurological:     Mental Status: She is alert and oriented to person, place, and time.  Psychiatric:        Mood and Affect: Mood normal.        Behavior: Behavior normal.     Lab Results  Component Value Date   WBC 5.9 10/25/2020   HGB 15.2 10/25/2020   HCT 47.5 (H) 10/25/2020   PLT 412 10/25/2020   GLUCOSE 142 (H) 10/25/2020   CHOL 158 10/25/2020   TRIG 214 (H) 10/25/2020   HDL 51 10/25/2020   LDLCALC 72 10/25/2020   ALT 21 10/25/2020   AST 22 10/25/2020   NA 134 10/25/2020   K 4.2 10/25/2020   CL 95 (L) 10/25/2020   CREATININE 0.97 10/25/2020   BUN 18 10/25/2020   CO2 21 10/25/2020   INR 0.92  04/01/2013   HGBA1C 7.7 (H) 10/25/2020   MICROALBUR 30 04/21/2020      Assessment & Plan:   1. Dyslipidemia associated with type 2 diabetes mellitus (Lindsey [name]) Control: Improved. Recommend check sugars fasting daily. Recommend check feet daily. Recommend annual eye exams. Medicines: Change Trulicity to Ozempic. Continue to work on eating a healthy diet and exercise.   2. Drug-induced nausea and vomiting - Due to  trulicity.  - Change Trulicity to Ozempic.  3. Essential hypertension, benign Well controlled.  No changes to medicines.  Continue to work on eating a healthy diet and exercise.   4. Gastroesophageal reflux disease without esophagitis - No changes in medication.  5. GAD (generalized anxiety disorder) Has improved. Did not start Lindsey yet because she felt so bad on trulicity.  Consider in future. Pt has medicine.  I,Marla I Leal-Borjas,acting as a scribe for Rochel Brome, MD.,have documented all relevant documentation on the behalf of Rochel Brome, MD,as directed by  Rochel Brome, MD while in the presence of Rochel Brome, MD.     Meds ordered this encounter  Medications  . DISCONTD: Semaglutide,0.25 or 0.5MG /DOS, (OZEMPIC, 0.25 OR 0.5 MG/DOSE,) 2 MG/1.5ML SOPN    Sig: Inject 0.5 mg into the skin once a week.    Dispense:  1.5 mL    Refill:  0  . Semaglutide,0.25 or 0.5MG /DOS, (OZEMPIC, 0.25 OR 0.5 MG/DOSE,) 2 MG/1.5ML SOPN    Sig: Inject 0.5 mg into the skin once a week.    Dispense:  4.5 mL    Refill:  0    Cancel previous ozempic   Follow-up: No follow-ups on file.  An After Visit Summary was printed and given to the patient.  Rochel Brome, MD [facility] [phone]

## 2020-11-25 ENCOUNTER — Other Ambulatory Visit: Payer: Self-pay

## 2020-11-25 ENCOUNTER — Ambulatory Visit: Payer: BC Managed Care – PPO | Admitting: Family Medicine

## 2020-11-25 VITALS — BP 120/76 | HR 60 | Temp 97.2°F | Resp 16 | Ht 64.0 in | Wt 172.0 lb

## 2020-11-25 DIAGNOSIS — I10 Essential (primary) hypertension: Secondary | ICD-10-CM

## 2020-11-25 DIAGNOSIS — E785 Hyperlipidemia, unspecified: Secondary | ICD-10-CM

## 2020-11-25 DIAGNOSIS — T50905A Adverse effect of unspecified drugs, medicaments and biological substances, initial encounter: Secondary | ICD-10-CM

## 2020-11-25 DIAGNOSIS — R112 Nausea with vomiting, unspecified: Secondary | ICD-10-CM

## 2020-11-25 DIAGNOSIS — E1169 Type 2 diabetes mellitus with other specified complication: Secondary | ICD-10-CM | POA: Diagnosis not present

## 2020-11-25 DIAGNOSIS — F411 Generalized anxiety disorder: Secondary | ICD-10-CM

## 2020-11-25 DIAGNOSIS — K219 Gastro-esophageal reflux disease without esophagitis: Secondary | ICD-10-CM

## 2020-11-25 MED ORDER — OZEMPIC (0.25 OR 0.5 MG/DOSE) 2 MG/1.5ML ~~LOC~~ SOPN: 0.5000 mg | PEN_INJECTOR | SUBCUTANEOUS | 0 refills | Status: DC

## 2020-11-30 ENCOUNTER — Encounter: Payer: Self-pay | Admitting: Family Medicine

## 2020-12-16 ENCOUNTER — Other Ambulatory Visit: Payer: Self-pay | Admitting: Family Medicine

## 2021-01-20 DIAGNOSIS — Z1231 Encounter for screening mammogram for malignant neoplasm of breast: Secondary | ICD-10-CM | POA: Diagnosis not present

## 2021-01-20 LAB — HM MAMMOGRAPHY: HM Mammogram: NORMAL (ref 0–4)

## 2021-01-25 ENCOUNTER — Other Ambulatory Visit: Payer: BC Managed Care – PPO

## 2021-01-25 DIAGNOSIS — E785 Hyperlipidemia, unspecified: Secondary | ICD-10-CM | POA: Diagnosis not present

## 2021-01-25 DIAGNOSIS — E1169 Type 2 diabetes mellitus with other specified complication: Secondary | ICD-10-CM | POA: Diagnosis not present

## 2021-01-26 ENCOUNTER — Other Ambulatory Visit: Payer: Self-pay | Admitting: Family Medicine

## 2021-01-26 DIAGNOSIS — Z8543 Personal history of malignant neoplasm of ovary: Secondary | ICD-10-CM

## 2021-01-26 LAB — LIPID PANEL
Chol/HDL Ratio: 2.3 ratio (ref 0.0–4.4)
Cholesterol, Total: 119 mg/dL (ref 100–199)
HDL: 51 mg/dL (ref >39)
LDL Chol Calc (NIH): 42 mg/dL (ref 0–99)
Triglycerides: 157 mg/dL — ABNORMAL HIGH (ref 0–149)
VLDL Cholesterol Cal: 26 mg/dL (ref 5–40)

## 2021-01-26 LAB — COMPREHENSIVE METABOLIC PANEL
ALT: 15 IU/L (ref 0–32)
AST: 13 IU/L (ref 0–40)
Albumin/Globulin Ratio: 1.8 (ref 1.2–2.2)
Albumin: 4.4 g/dL (ref 3.8–4.8)
Alkaline Phosphatase: 63 IU/L (ref 44–121)
BUN/Creatinine Ratio: 16 (ref 12–28)
BUN: 13 mg/dL (ref 8–27)
Bilirubin Total: 0.5 mg/dL (ref 0.0–1.2)
CO2: 23 mmol/L (ref 20–29)
Calcium: 10.1 mg/dL (ref 8.7–10.3)
Chloride: 95 mmol/L — ABNORMAL LOW (ref 96–106)
Creatinine, Ser: 0.83 mg/dL (ref 0.57–1.00)
Globulin, Total: 2.4 g/dL (ref 1.5–4.5)
Glucose: 103 mg/dL — ABNORMAL HIGH (ref 65–99)
Potassium: 4 mmol/L (ref 3.5–5.2)
Sodium: 136 mmol/L (ref 134–144)
Total Protein: 6.8 g/dL (ref 6.0–8.5)
eGFR: 79 mL/min/{1.73_m2} (ref >59)

## 2021-01-26 LAB — CBC WITH DIFFERENTIAL/PLATELET
Basophils Absolute: 0.1 10*3/uL (ref 0.0–0.2)
Basos: 1 %
EOS (ABSOLUTE): 0.1 10*3/uL (ref 0.0–0.4)
Eos: 2 %
Hematocrit: 44.4 % (ref 34.0–46.6)
Hemoglobin: 14.1 g/dL (ref 11.1–15.9)
Immature Grans (Abs): 0 10*3/uL (ref 0.0–0.1)
Immature Granulocytes: 0 %
Lymphocytes Absolute: 2.1 10*3/uL (ref 0.7–3.1)
Lymphs: 32 %
MCH: 26.8 pg (ref 26.6–33.0)
MCHC: 31.8 g/dL (ref 31.5–35.7)
MCV: 84 fL (ref 79–97)
Monocytes Absolute: 0.6 10*3/uL (ref 0.1–0.9)
Monocytes: 9 %
Neutrophils Absolute: 3.7 10*3/uL (ref 1.4–7.0)
Neutrophils: 56 %
Platelets: 446 10*3/uL (ref 150–450)
RBC: 5.26 x10E6/uL (ref 3.77–5.28)
RDW: 14.4 % (ref 11.7–15.4)
WBC: 6.6 10*3/uL (ref 3.4–10.8)

## 2021-01-26 LAB — HEMOGLOBIN A1C
Est. average glucose Bld gHb Est-mCnc: 137 mg/dL
Hgb A1c MFr Bld: 6.4 % — ABNORMAL HIGH (ref 4.8–5.6)

## 2021-01-26 LAB — CARDIOVASCULAR RISK ASSESSMENT

## 2021-01-26 NOTE — Progress Notes (Signed)
Subjective:  Patient ID: Lindsey [name], female    DOB: Jul 14, 1956  Age: 65 y.o. MRN: [id number]  Chief Complaint  Patient presents with  . Diabetes  . Hypertension    HPI Dyslipidemia associated with type 2 diabetes mellitus (HCC) Lovaza 1 g 2 caps bid, pravastatin 10 mg daily , ozempic 0.5 weekly , synjardy 12.01-999 mg daily. Checks FBS daily ranges from 110-125 and checks feet daily. Has eye appointment 03/09/2021.  Essential hypertension, benign Lisinopril-HCTZ 20-25 mg daily  Gastroesophageal reflux disease without esophagitis Omeprazole 20 mg daily  GAD (generalized anxiety disorder) Has never started Wellbutrin 150 mg and takes Ativan 0.5 as needed  Primary insomnia Ambien 12.5 mg qhs  Current Outpatient Medications on File Prior to Visit  Medication Sig Dispense Refill  . aspirin EC 81 MG tablet Take 81 mg by mouth every morning.    . Calcium Carb-Cholecalciferol (CALCIUM 600 + D PO) Take 1 tablet by mouth at bedtime.    Marland Kitchen lisinopril-hydrochlorothiazide (ZESTORETIC) 20-25 MG tablet TAKE 1 TABLET BY MOUTH  DAILY 90 tablet 3  . LORazepam (ATIVAN) 0.5 MG tablet TAKE 1 TABLET BY MOUTH ONCE DAILY AS NEEDED FOR ANXIETY. 30 tablet 1  . omega-3 acid ethyl esters (LOVAZA) 1 g capsule Take 2 capsules (2 g total) by mouth 2 (two) times daily. 360 capsule 1  . omeprazole (PRILOSEC) 20 MG capsule TAKE 1 CAPSULE BY MOUTH ONCE DAILY BEFORE A MEAL 90 capsule 3  . pravastatin (PRAVACHOL) 10 MG tablet TAKE 1 TABLET BY MOUTH ONCE DAILY 90 tablet 3  . SUMAtriptan (IMITREX) 100 MG tablet One at onset of aura/migraine, repeat in 2 hours x 1 per 24 hours. 27 tablet 1  . SYNJARDY XR 12.01-999 MG TB24 TAKE 2 TABLETS BY MOUTH  DAILY 180 tablet 3  . tizanidine (ZANAFLEX) 2 MG capsule Take 1 capsule (2 mg total) by mouth daily as needed for muscle spasms. 90 capsule 1  . zolpidem (AMBIEN CR) 12.5 MG CR tablet TAKE 1 TABLET BY MOUTH AT BEDTIME 90 tablet 1   No current facility-administered  medications on file prior to visit.   Past Medical History:  Diagnosis Date  . Arthritis   . 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 Diabetes mellitus without complication ([place])   . Frequent UTI     h/o since hysterectomy/04/01/13 asymptomatic  . GERD (gastroesophageal reflux disease)   . Headache(784.0)    migraines  . Hypertension    Past Surgical History:  Procedure Laterality Date  . ABDOMINAL HYSTERECTOMY  2014   Stromal ovarian cancer  . CHOLECYSTECTOMY    . ENTEROCELE REPAIR    . KNEE ARTHROSCOPY Right   . KNEE ARTHROSCOPY Right 07/01/2014   Procedure: RIGHT ARTHROSCOPY KNEE WITH SYNOVECTOMY;  Surgeon: [name], MD;  Location: [facility];  Service: Orthopedics;  Laterality: Right;  . RECTOCELE REPAIR    . TONSILLECTOMY    . TOTAL KNEE ARTHROPLASTY Right 04/07/2013   Procedure: RIGHT TOTAL KNEE ARTHROPLASTY;  Surgeon: [name], MD;  Location: [facility];  Service: Orthopedics;  Laterality: Right;    No family history on file. Social History   Socioeconomic History  . Marital status: Married    Spouse name: Not on file  . Number of children: Not on file  . Years of education: Not on file  . Highest education level: Not on file  Occupational History  . Not on file  Tobacco Use  . Smoking status: Never Smoker  .  Smokeless tobacco: Never Used  Substance and Sexual Activity  . Alcohol use: No  . Drug use: No  . Sexual activity: Not on file  Other Topics Concern  . Not on file  Social History Narrative  . Not on file   Social Determinants of Health   Financial Resource Strain: Not on file  Food Insecurity: Not on file  Transportation Needs: Not on file  Physical Activity: Not on file  Stress: Not on file  Social Connections: Not on file    Review of Systems  Constitutional: Negative for chills, fatigue and fever.  HENT: Positive for sore throat. Negative for congestion, ear pain and rhinorrhea.   Respiratory: Negative for cough and shortness of  breath.   Cardiovascular: Negative for chest pain.  Gastrointestinal: Negative for abdominal pain, constipation, diarrhea, nausea and vomiting.  Genitourinary: Negative for dysuria and urgency.  Musculoskeletal: Negative for back pain and myalgias.  Neurological: Negative for dizziness, weakness, light-headedness and headaches.  Psychiatric/Behavioral: Negative for dysphoric mood. The patient is not nervous/anxious.      Objective:  BP 126/78   Pulse 64   Temp (!) 96.4 F (35.8 C)   Ht 5\' 4"  (1.626 m)   Wt 163 lb (73.9 kg)   SpO2 99%   BMI 27.98 kg/m   BP/Weight 01/28/2021 11/25/2020 12/11/8117  Systolic BP 147 829 562  Diastolic BP 78 76 78  Wt. (Lbs) 163 172 181  BMI 27.98 29.52 31.07    Physical Exam Vitals reviewed.  Constitutional:      Appearance: Normal appearance. She is normal weight.  Neck:     Vascular: No carotid bruit.  Cardiovascular:     Rate and Rhythm: Normal rate and regular rhythm.     Pulses: Normal pulses.     Heart sounds: Normal heart sounds.  Pulmonary:     Effort: Pulmonary effort is normal. No respiratory distress.     Breath sounds: Normal breath sounds.  Abdominal:     General: Abdomen is flat. Bowel sounds are normal.     Palpations: Abdomen is soft.     Tenderness: There is no abdominal tenderness.  Neurological:     Mental Status: She is alert and oriented to person, place, and time.  Psychiatric:        Mood and Affect: Mood normal.        Behavior: Behavior normal.     Diabetic Foot Exam - Simple   Simple Foot Form Diabetic Foot exam was performed with the following findings: Yes 01/28/2021 12:17 PM  Visual Inspection No deformities, no ulcerations, no other skin breakdown bilaterally: Yes Sensation Testing Intact to touch and monofilament testing bilaterally: Yes Pulse Check Posterior Tibialis and Dorsalis pulse intact bilaterally: Yes Comments      Lab Results  Component Value Date   WBC 6.6 01/25/2021   HGB 14.1  01/25/2021   HCT 44.4 01/25/2021   PLT 446 01/25/2021   GLUCOSE 103 (H) 01/25/2021   CHOL 119 01/25/2021   TRIG 157 (H) 01/25/2021   HDL 51 01/25/2021   LDLCALC 42 01/25/2021   ALT 15 01/25/2021   AST 13 01/25/2021   NA 136 01/25/2021   K 4.0 01/25/2021   CL 95 (L) 01/25/2021   CREATININE 0.83 01/25/2021   BUN 13 01/25/2021   CO2 23 01/25/2021   INR 0.92 04/01/2013   HGBA1C 6.4 (H) 01/25/2021   MICROALBUR 30 04/21/2020      Assessment & Plan:   1. Dyslipidemia associated with  type 2 diabetes mellitus (HCC) Control: good Recommend check sugars fasting daily. Recommend check feet daily. Recommend annual eye exams. Medicines: no changes Continue to work on eating a healthy diet and exercise.  Labs drawn today.   - CBC with Differential/Platelet; Future - Hemoglobin A1c; Future - Lipid panel; Future  2. Mixed hyperlipidemia Well controlled.  No changes to medicines.  Continue to work on eating a healthy diet and exercise.  Labs reviewed.  3. Essential hypertension, benign The current medical regimen is effective;  continue present plan and medications. - Labs reviewed - Comprehensive metabolic panel; Future  4. Gastroesophageal reflux disease without esophagitis The current medical regimen is effective;  continue present plan and medications.  5. Primary insomnia The current medical regimen is effective;  continue present plan and medications.  6. BMI 27.0-27.9,adult  Recommend continue to work on eating healthy diet and exercise.  I,Marla I Leal-Borjas,acting as a scribe for Rochel Brome, MD.,have documented all relevant documentation on the behalf of Rochel Brome, MD,as directed by  Rochel Brome, MD while in the presence of Rochel Brome, MD.  Meds ordered this encounter  Medications  . Semaglutide,0.25 or 0.5MG /DOS, (OZEMPIC, 0.25 OR 0.5 MG/DOSE,) 2 MG/1.5ML SOPN    Sig: Inject 0.5 mg into the skin once a week.    Dispense:  4.5 mL    Refill:  1    Cancel  previous ozempic    Orders Placed This Encounter  Procedures  . CBC with Differential/Platelet  . Comprehensive metabolic panel  . Hemoglobin A1c  . Lipid panel     Follow-up: Return in about 3 months (around 04/30/2021) for fasting.  An After Visit Summary was printed and given to the patient.  Rochel Brome, MD [facility] [phone]   I,Katherina A Bramblett,acting as a scribe for Rochel Brome, MD.,have documented all relevant documentation on the behalf of Rochel Brome, MD,as directed by  Rochel Brome, MD while in the presence of Rochel Brome, MD.

## 2021-01-28 ENCOUNTER — Other Ambulatory Visit: Payer: Self-pay

## 2021-01-28 ENCOUNTER — Ambulatory Visit: Payer: BC Managed Care – PPO | Admitting: Family Medicine

## 2021-01-28 VITALS — BP 126/78 | HR 64 | Temp 96.4°F | Ht 64.0 in | Wt 163.0 lb

## 2021-01-28 DIAGNOSIS — F411 Generalized anxiety disorder: Secondary | ICD-10-CM

## 2021-01-28 DIAGNOSIS — F5101 Primary insomnia: Secondary | ICD-10-CM

## 2021-01-28 DIAGNOSIS — E785 Hyperlipidemia, unspecified: Secondary | ICD-10-CM

## 2021-01-28 DIAGNOSIS — K219 Gastro-esophageal reflux disease without esophagitis: Secondary | ICD-10-CM | POA: Diagnosis not present

## 2021-01-28 DIAGNOSIS — I10 Essential (primary) hypertension: Secondary | ICD-10-CM

## 2021-01-28 DIAGNOSIS — E1169 Type 2 diabetes mellitus with other specified complication: Secondary | ICD-10-CM | POA: Diagnosis not present

## 2021-01-28 DIAGNOSIS — Z6827 Body mass index (BMI) 27.0-27.9, adult: Secondary | ICD-10-CM

## 2021-01-28 DIAGNOSIS — E782 Mixed hyperlipidemia: Secondary | ICD-10-CM

## 2021-01-28 LAB — SPECIMEN STATUS REPORT

## 2021-01-28 LAB — INHIBIN B: Inhibin B: <7 pg/mL (ref 0.0–16.9)

## 2021-01-28 MED ORDER — OZEMPIC (0.25 OR 0.5 MG/DOSE) 2 MG/1.5ML ~~LOC~~ SOPN: 0.5000 mg | PEN_INJECTOR | SUBCUTANEOUS | 1 refills | Status: DC

## 2021-01-30 ENCOUNTER — Encounter: Payer: Self-pay | Admitting: Family Medicine

## 2021-02-03 ENCOUNTER — Other Ambulatory Visit: Payer: Self-pay | Admitting: Family Medicine

## 2021-02-15 ENCOUNTER — Encounter: Payer: Self-pay | Admitting: Nurse Practitioner

## 2021-02-15 ENCOUNTER — Ambulatory Visit: Payer: BC Managed Care – PPO | Admitting: Nurse Practitioner

## 2021-02-15 ENCOUNTER — Other Ambulatory Visit: Payer: Self-pay

## 2021-02-15 VITALS — BP 110/72 | HR 72 | Temp 96.0°F | Resp 16 | Ht 64.0 in | Wt 160.4 lb

## 2021-02-15 DIAGNOSIS — R300 Dysuria: Secondary | ICD-10-CM

## 2021-02-15 DIAGNOSIS — Z90710 Acquired absence of both cervix and uterus: Secondary | ICD-10-CM

## 2021-02-15 DIAGNOSIS — R339 Retention of urine, unspecified: Secondary | ICD-10-CM

## 2021-02-15 DIAGNOSIS — N8110 Cystocele, unspecified: Secondary | ICD-10-CM | POA: Diagnosis not present

## 2021-02-15 DIAGNOSIS — R319 Hematuria, unspecified: Secondary | ICD-10-CM | POA: Diagnosis not present

## 2021-02-15 DIAGNOSIS — Z8543 Personal history of malignant neoplasm of ovary: Secondary | ICD-10-CM

## 2021-02-15 LAB — POCT URINALYSIS DIPSTICK
Bilirubin, UA: NEGATIVE
Blood, UA: POSITIVE
Glucose, UA: POSITIVE — AB
Ketones, UA: NEGATIVE
Leukocytes, UA: NEGATIVE
Nitrite, UA: NEGATIVE
Protein, UA: NEGATIVE
Spec Grav, UA: 1.015 (ref 1.010–1.025)
Urobilinogen, UA: 0.2 E.U./dL
pH, UA: 6 (ref 5.0–8.0)

## 2021-02-15 MED ORDER — CIPROFLOXACIN HCL 250 MG PO TABS: 250.0000 mg | ORAL_TABLET | Freq: Two times a day (BID) | ORAL | 0 refills | Status: AC

## 2021-02-15 NOTE — Progress Notes (Signed)
Established Patient Office Visit  Subjective:  Patient ID: Lindsey [name], female    DOB: 07-Dec-1955  Age: 65 y.o. MRN: [id number]  CC: urinary pressure   HPI Lindsey [name] is a 65 year old Caucasian female that presents for evaluation of vaginal pressure and dysuria with voiding. Onset of symptoms was approximately 1-week ago after carrying some heavy chairs at the beach. Lindsey [name] tells me that she has undergone a cystocele and rectocele several years ago. She also had a total abdominal hysterectomy with BSO. She denies previous mid-urethral bladder sling. She denies frequency, urgency, or stress urinary incontinence. States she feels a bulge in her vaginal area when she attempts to void. She has experienced some delayed bladder emptying. With intermittent urinary incontinence.     Past Medical History:  Diagnosis Date  . Arthritis   . 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 Diabetes mellitus without complication ([place])   . Frequent UTI     h/o since hysterectomy/04/01/13 asymptomatic  . GERD (gastroesophageal reflux disease)   . Headache(784.0)    migraines  . Hypertension     Past Surgical History:  Procedure Laterality Date  . ABDOMINAL HYSTERECTOMY  2014   Stromal ovarian cancer  . CHOLECYSTECTOMY    . ENTEROCELE REPAIR    . KNEE ARTHROSCOPY Right   . KNEE ARTHROSCOPY Right 07/01/2014   Procedure: RIGHT ARTHROSCOPY KNEE WITH SYNOVECTOMY;  Surgeon: [name], MD;  Location: [facility];  Service: Orthopedics;  Laterality: Right;  . RECTOCELE REPAIR    . TONSILLECTOMY    . TOTAL KNEE ARTHROPLASTY Right 04/07/2013   Procedure: RIGHT TOTAL KNEE ARTHROPLASTY;  Surgeon: [name], MD;  Location: [facility];  Service: Orthopedics;  Laterality: Right;    No family history on file.  Social History   Socioeconomic History  . Marital status: Married    Spouse name: Not on file  . Number of children: Not on file  . Years of education: Not on file  . Highest education level:  Not on file  Occupational History  . Not on file  Tobacco Use  . Smoking status: Never  . Smokeless tobacco: Never  Substance and Sexual Activity  . Alcohol use: No  . Drug use: No  . Sexual activity: Not on file  Other Topics Concern  . Not on file  Social History Narrative  . Not on file   Social Determinants of Health   Financial Resource Strain: Not on file  Food Insecurity: Not on file  Transportation Needs: Not on file  Physical Activity: Not on file  Stress: Not on file  Social Connections: Not on file  Intimate Partner Violence: Not on file    Outpatient Medications Prior to Visit  Medication Sig Dispense Refill  . aspirin EC 81 MG tablet Take 81 mg by mouth every morning.    . Calcium Carb-Cholecalciferol (CALCIUM 600 + D PO) Take 1 tablet by mouth at bedtime.    Marland Kitchen lisinopril-hydrochlorothiazide (ZESTORETIC) 20-25 MG tablet TAKE 1 TABLET BY MOUTH  DAILY 90 tablet 3  . LORazepam (ATIVAN) 0.5 MG tablet TAKE 1 TABLET BY MOUTH ONCE DAILY AS NEEDED FOR ANXIETY. 30 tablet 1  . omega-3 acid ethyl esters (LOVAZA) 1 g capsule TAKE 2 CAPSULES BY MOUTH  TWICE DAILY 360 capsule 3  . omeprazole (PRILOSEC) 20 MG capsule TAKE 1 CAPSULE BY MOUTH ONCE DAILY BEFORE A MEAL 90 capsule 3  . pravastatin (PRAVACHOL) 10 MG tablet TAKE 1 TABLET BY MOUTH  ONCE DAILY 90 tablet 3  . Semaglutide,0.25 or 0.5MG/DOS, (OZEMPIC, 0.25 OR 0.5 MG/DOSE,) 2 MG/1.5ML SOPN Inject 0.5 mg into the skin once a week. 4.5 mL 1  . SUMAtriptan (IMITREX) 100 MG tablet One at onset of aura/migraine, repeat in 2 hours x 1 per 24 hours. 27 tablet 1  . SYNJARDY XR 12.01-999 MG TB24 TAKE 2 TABLETS BY MOUTH  DAILY 180 tablet 3  . tizanidine (ZANAFLEX) 2 MG capsule Take 1 capsule (2 mg total) by mouth daily as needed for muscle spasms. 90 capsule 1  . zolpidem (AMBIEN CR) 12.5 MG CR tablet TAKE 1 TABLET BY MOUTH AT BEDTIME 90 tablet 1   No facility-administered medications prior to visit.    Allergies  Allergen  Reactions  . Amoxicillin Hives  . Trulicity [Dulaglutide] Nausea And Vomiting  . Citalopram Other (See Comments)    headache  . Erythromycin Hives and Rash  . Hydrocodone Other (See Comments)    Achy all over   . Sertraline Other (See Comments)    irritability  . Sulfa Antibiotics Rash    fever  . Tramadol Nausea Only and Palpitations  . Trazodone And Nefazodone Other (See Comments)    Insomnia   . Venlafaxine Other (See Comments)    migrane     ROS Review of Systems  Constitutional:  Negative for appetite change, fatigue and unexpected weight change.  HENT:  Negative for congestion, ear pain, rhinorrhea, sinus pressure, sinus pain and tinnitus.   Eyes:  Negative for pain.  Respiratory:  Negative for cough and shortness of breath.   Cardiovascular:  Negative for chest pain, palpitations and leg swelling.  Gastrointestinal:  Negative for abdominal pain, constipation, diarrhea, nausea and vomiting.  Endocrine: Negative for cold intolerance, heat intolerance, polydipsia, polyphagia and polyuria.  Genitourinary:  Positive for decreased urine volume and dysuria. Negative for frequency and hematuria.  Musculoskeletal:  Negative for arthralgias, back pain, joint swelling and myalgias.  Skin:  Negative for rash.  Allergic/Immunologic: Negative for environmental allergies.  Neurological:  Negative for dizziness and headaches.  Hematological:  Negative for adenopathy.  Psychiatric/Behavioral:  Negative for decreased concentration and sleep disturbance. The patient is not nervous/anxious.      Objective:    Physical Exam Vitals reviewed. Exam conducted with a chaperone present.  Constitutional:      Appearance: Normal appearance.  HENT:     Head: Normocephalic.     Right Ear: Tympanic membrane normal.     Left Ear: Tympanic membrane normal.     Nose: Nose normal.     Mouth/Throat:     Mouth: Mucous membranes are moist.  Neck:     Vascular: No carotid bruit.  Cardiovascular:      Rate and Rhythm: Normal rate and regular rhythm.     Pulses: Normal pulses.     Heart sounds: Normal heart sounds.  Pulmonary:     Effort: Pulmonary effort is normal.     Breath sounds: Normal breath sounds.  Abdominal:     General: Bowel sounds are normal.     Palpations: Abdomen is soft.     Tenderness: There is no abdominal tenderness. There is no guarding.  Genitourinary:    Exam position: Lithotomy position.     Vagina: Prolapsed vaginal walls present.  Musculoskeletal:        General: No swelling.  Skin:    General: Skin is warm and dry.     Capillary Refill: Capillary refill takes less than 2 seconds.  Neurological:     Mental Status: She is alert and oriented to person, place, and time.  Psychiatric:        Mood and Affect: Mood normal.        Behavior: Behavior normal.    Wt Readings from Last 3 Encounters:  01/28/21 163 lb (73.9 kg)  11/25/20 172 lb (78 kg)  10/28/20 181 lb (82.1 kg)     Health Maintenance Due  Topic Date Due  . HIV Screening  Never done  . Hepatitis C Screening  Never done  . COVID-19 Vaccine (4 - Booster for Moderna series) 11/27/2020    Lab Results  Component Value Date   WBC 6.6 01/25/2021   HGB 14.1 01/25/2021   HCT 44.4 01/25/2021   MCV 84 01/25/2021   PLT 446 01/25/2021   Lab Results  Component Value Date   NA 136 01/25/2021   K 4.0 01/25/2021   CO2 23 01/25/2021   GLUCOSE 103 (H) 01/25/2021   BUN 13 01/25/2021   CREATININE 0.83 01/25/2021   BILITOT 0.5 01/25/2021   ALKPHOS 63 01/25/2021   AST 13 01/25/2021   ALT 15 01/25/2021   PROT 6.8 01/25/2021   ALBUMIN 4.4 01/25/2021   CALCIUM 10.1 01/25/2021   ANIONGAP 15 06/25/2014   EGFR 79 01/25/2021   Lab Results  Component Value Date   CHOL 119 01/25/2021   Lab Results  Component Value Date   HDL 51 01/25/2021   Lab Results  Component Value Date   LDLCALC 42 01/25/2021   Lab Results  Component Value Date   TRIG 157 (H) 01/25/2021   Lab Results   Component Value Date   CHOLHDL 2.3 01/25/2021   Lab Results  Component Value Date   HGBA1C 6.4 (H) 01/25/2021      Assessment & Plan:  1. Dysuria - POCT urinalysis dipstick - ciprofloxacin (CIPRO) 250 MG tablet; Take 1 tablet (250 mg total) by mouth 2 (two) times daily for 3 days.  Dispense: 6 tablet; Refill: 0 - Ambulatory referral to Urology  2. Hematuria, unspecified type - ciprofloxacin (CIPRO) 250 MG tablet; Take 1 tablet (250 mg total) by mouth 2 (two) times daily for 3 days.  Dispense: 6 tablet; Refill: 0  3. Bladder prolapse, female, acquired - Ambulatory referral to Urology  4. Incomplete bladder emptying - Ambulatory referral to Urology  5. History of ovarian cancer in adulthood  6. History of hysterectomy - Ambulatory referral to Urology   No heavy lifting Take Cipro twice daily   Follow-up: As needed  Signed, Rip Harbour, NP

## 2021-02-15 NOTE — Patient Instructions (Signed)
No heavy lifting Take Cipro twice daily  Pelvic Organ Prolapse Pelvic organ prolapse is a condition in women that involves the stretching, bulging, or dropping of pelvic organs into an abnormal position, past the opening of the vagina. It happens when the muscles and tissues that surround and support pelvic structures become weak or stretched. Pelvic organ prolapse can involve the: Vagina (vaginal prolapse). Uterus (uterine prolapse). Bladder (cystocele). Rectum (rectocele). Intestines (enterocele). When organs other than the vagina are involved, they often bulge into thevagina or protrude from the vagina, depending on how severe the prolapse is. What are the causes? This condition may be caused by: Pregnancy, labor, and childbirth. Past pelvic surgery. Lower levels of the hormone estrogen due to menopause. Consistently lifting more than 50 lb (23 kg). Obesity. Long-term difficulty passing stool (chronic constipation). Long-term, or chronic, cough. Fluid buildup in the abdomen due to certain conditions. What are the signs or symptoms? Symptoms of this condition include: Leaking a little urine (loss of bladder control) when you cough, sneeze, strain, and exercise (stress incontinence). This may be worse immediately after childbirth. It may gradually improve over time. Feeling pressure in your pelvis or vagina. This pressure may increase when you cough or when you are passing stool. A bulge that protrudes from the opening of your vagina. Difficulty passing urine or stool. Pain in your lower back. Pain or discomfort during sex, or decreased interest in sex. Repeated bladder infections (urinary tract infections). Difficulty inserting a tampon. In some people, this condition causes no symptoms. How is this diagnosed? This condition may be diagnosed based on a vaginal and rectal exam. During the exam, you may be asked to cough and strain while you are lying down, sitting, and standing up.  Your health care provider will determine if other tests arerequired, such as bladder function tests. How is this treated? Treatment for this condition may depend on your symptoms. Treatment may include: Lifestyle changes, such as drinking plenty of fluids and eating foods that are high in fiber. Emptying your bladder at scheduled times (bladder training therapy). This can help reduce or avoid urinary incontinence. Estrogen. This may help mild prolapse by increasing the strength and tone of pelvic floor muscles. Kegel exercises. These may help mild cases of prolapse by strengthening and tightening the muscles of the pelvic floor. A soft, flexible device that helps support the vaginal walls and keep pelvic organs in place (pessary). This is inserted into your vagina by your health care provider. Surgery. This is often the only form of treatment for severe prolapse. Follow these instructions at home: Eating and drinking Avoid drinking beverages that contain caffeine or alcohol. Increase your intake of high-fiber foods to decrease constipation and straining during bowel movements. Activity Lose weight if recommended by your health care provider. Avoid heavy lifting and straining with exercise and work. Do not hold your breath when you perform mild to moderate lifting and exercise activities. Limit your activities as directed by your health care provider. Do Kegel exercises as directed by your health care provider. To do this: Squeeze your pelvic floor muscles tight. You should feel a tight lift in your rectal area and a tightness in your vaginal area. Keep your stomach, buttocks, and legs relaxed. Hold the muscles tight for up to 10 seconds. Then relax your muscles. Repeat this exercise 50 times a day, or as much as told by your health care provider. Continue to do this exercise for at least 4-6 weeks, or for as long as  told by your health care provider. General instructions Take over-the-counter  and prescription medicines only as told by your health care provider. Wear a sanitary pad or adult diapers if you have urinary incontinence. If you have a pessary, take care of it as told by your health care provider. Keep all follow-up visits. This is important. Contact a health care provider if you: Have symptoms that interfere with your daily activities or sex life. Need medicine to help with the discomfort. Notice bleeding from your vagina that is not related to your menstrual period. Have a fever. Have pain or bleeding when you urinate. Have bleeding when you pass stool. Pass urine when you have sex. Have chronic constipation. Have a pessary that falls out. Have a foul-smelling vaginal discharge. Have an unusual, low pain in your abdomen. Get help right away if you: Cannot pass urine. Summary Pelvic organ prolapse is the stretching, bulging, or dropping of pelvic organs into an abnormal position. It happens when the muscles and tissues that surround and support pelvic structures become weak or stretched. When organs other than the vagina are involved, they often bulge into the vagina or protrude from it, depending on how severe the prolapse is. In most cases, this condition needs to be treated only if it produces symptoms. Treatment may include lifestyle changes, estrogen, Kegel exercises, pessary insertion, or surgery. Avoid heavy lifting and straining with exercise and work. Do not hold your breath when you perform mild to moderate lifting and exercise activities. Limit your activities as directed by your health care provider. This information is not intended to replace advice given to you by your health care provider. Make sure you discuss any questions you have with your healthcare provider. Document Revised: 02/16/2020 Document Reviewed: 02/16/2020 Elsevier Patient Education  2022 [place]. Hematuria, Adult Hematuria is blood in the urine. Blood may be visible in the urine,  or it may be identified with a test. This condition can be caused by infections of the bladder, urethra, kidney, or prostate. Other possible causes include: Kidney stones. Cancer of the urinary tract. Too much calcium in the urine. Conditions that are passed from parent to child (inherited conditions). Exercise that requires a lot of energy. Infections can usually be treated with medicine, and a kidney stone usually will pass through your urine. If neither of these is the cause of yourhematuria, more tests may be needed to identify the cause of your symptoms. It is very important to tell your health care provider about any blood in your urine, even if it is painless or the blood stops without treatment. Blood in the urine, when it happens and then stops and then happens again, can be a symptom of a very serious condition, including cancer. There is no pain in theinitial stages of many urinary cancers. Follow these instructions at home: Medicines Take over-the-counter and prescription medicines only as told by your health care provider. If you were prescribed an antibiotic medicine, take it as told by your health care provider. Do not stop taking the antibiotic even if you start to feel better. Eating and drinking Drink enough fluid to keep your urine pale yellow. It is recommended that you drink 3-4 quarts (2.8-3.8 L) a day. If you have been diagnosed with an infection, drinking cranberry juice in addition to large amounts of water is recommended. Avoid caffeine, tea, and carbonated beverages. These tend to irritate the bladder. Avoid alcohol because it may irritate the prostate (in males). General instructions If you have  been diagnosed with a kidney stone, follow your health care provider's instructions about straining your urine to catch the stone. Empty your bladder often. Avoid holding urine for long periods of time. If you are female: After a bowel movement, wipe from front to back and use  each piece of toilet paper only once. Empty your bladder before and after sex. Pay attention to any changes in your symptoms. Tell your health care provider about any changes or any new symptoms. It is up to you to get the results of any tests. Ask your health care provider, or the department that is doing the test, when your results will be ready. Keep all follow-up visits. This is important. Contact a health care provider if: You develop back pain. You have a fever or chills. You have nausea or vomiting. Your symptoms do not improve after 3 days. Your symptoms get worse. Get help right away if: You develop severe vomiting and are unable to take medicine without vomiting. You develop severe pain in your back or abdomen even though you are taking medicine. You pass a large amount of blood in your urine. You pass blood clots in your urine. You feel very weak or like you might faint. You faint. Summary Hematuria is blood in the urine. It has many possible causes. It is very important that you tell your health care provider about any blood in your urine, even if it is painless or the blood stops without treatment. Take over-the-counter and prescription medicines only as told by your health care provider. Drink enough fluid to keep your urine pale yellow. This information is not intended to replace advice given to you by your health care provider. Make sure you discuss any questions you have with your healthcare provider. Document Revised: 04/21/2020 Document Reviewed: 04/21/2020 Elsevier Patient Education  2022 [employer].

## 2021-03-10 LAB — HM DIABETES EYE EXAM

## 2021-03-28 DIAGNOSIS — N390 Urinary tract infection, site not specified: Secondary | ICD-10-CM | POA: Diagnosis not present

## 2021-03-28 DIAGNOSIS — N3001 Acute cystitis with hematuria: Secondary | ICD-10-CM | POA: Diagnosis not present

## 2021-03-29 DIAGNOSIS — S93601A Unspecified sprain of right foot, initial encounter: Secondary | ICD-10-CM | POA: Diagnosis not present

## 2021-03-29 DIAGNOSIS — M79671 Pain in right foot: Secondary | ICD-10-CM | POA: Diagnosis not present

## 2021-03-29 DIAGNOSIS — M25571 Pain in right ankle and joints of right foot: Secondary | ICD-10-CM | POA: Diagnosis not present

## 2021-03-29 DIAGNOSIS — S93401A Sprain of unspecified ligament of right ankle, initial encounter: Secondary | ICD-10-CM | POA: Diagnosis not present

## 2021-05-03 ENCOUNTER — Other Ambulatory Visit: Payer: Self-pay | Admitting: Physician Assistant

## 2021-05-05 ENCOUNTER — Encounter: Payer: Self-pay | Admitting: Obstetrics and Gynecology

## 2021-05-05 ENCOUNTER — Other Ambulatory Visit: Payer: Self-pay

## 2021-05-05 ENCOUNTER — Other Ambulatory Visit: Payer: BC Managed Care – PPO

## 2021-05-05 ENCOUNTER — Ambulatory Visit (INDEPENDENT_AMBULATORY_CARE_PROVIDER_SITE_OTHER): Payer: BC Managed Care – PPO | Admitting: Obstetrics and Gynecology

## 2021-05-05 VITALS — BP 123/79 | HR 89 | Ht 64.0 in | Wt 156.0 lb

## 2021-05-05 DIAGNOSIS — N952 Postmenopausal atrophic vaginitis: Secondary | ICD-10-CM

## 2021-05-05 DIAGNOSIS — E785 Hyperlipidemia, unspecified: Secondary | ICD-10-CM | POA: Diagnosis not present

## 2021-05-05 DIAGNOSIS — E1169 Type 2 diabetes mellitus with other specified complication: Secondary | ICD-10-CM

## 2021-05-05 DIAGNOSIS — I10 Essential (primary) hypertension: Secondary | ICD-10-CM

## 2021-05-05 DIAGNOSIS — N8110 Cystocele, unspecified: Secondary | ICD-10-CM

## 2021-05-05 DIAGNOSIS — N390 Urinary tract infection, site not specified: Secondary | ICD-10-CM | POA: Diagnosis not present

## 2021-05-05 DIAGNOSIS — R350 Frequency of micturition: Secondary | ICD-10-CM

## 2021-05-05 DIAGNOSIS — N393 Stress incontinence (female) (male): Secondary | ICD-10-CM

## 2021-05-05 LAB — POCT URINALYSIS DIPSTICK
Appearance: NORMAL
Bilirubin, UA: NEGATIVE
Glucose, UA: POSITIVE — AB
Ketones, UA: NEGATIVE
Leukocytes, UA: NEGATIVE
Nitrite, UA: NEGATIVE
Protein, UA: NEGATIVE
Spec Grav, UA: 1.015 (ref 1.010–1.025)
Urobilinogen, UA: 0.2 E.U./dL
pH, UA: 5.5 (ref 5.0–8.0)

## 2021-05-05 MED ORDER — ESTRADIOL 0.1 MG/GM VA CREA: 0.5000 g | TOPICAL_CREAM | VAGINAL | 11 refills | Status: DC

## 2021-05-05 NOTE — Patient Instructions (Addendum)
Start vaginal estrogen therapy nightly for two weeks then 2 times weekly at night for treatment of vaginal atrophy (dryness of the vaginal tissues).  Please let us know if the prescription is too expensive and we can look for alternative options.   You have a stage 2 (out of 4) prolapse.  We discussed the fact that it is not life threatening but there are several treatment options. For treatment of pelvic organ prolapse, we discussed options for management including expectant management, conservative management, and surgical management, such as Kegels, a pessary, pelvic floor physical therapy, and specific surgical procedures.

## 2021-05-05 NOTE — Progress Notes (Signed)
[name] New Patient Evaluation and Consultation  Referring Provider: Rip Harbour, NP PCP: Rochel Brome, MD Date of Service: 05/05/2021  SUBJECTIVE Chief Complaint: New Patient (Initial Visit)  History of Present Illness: Lindsey [name] is a 65 y.o. Lindsey or Caucasian female seen in consultation at the request of Np Lindsey [name] for evaluation of prolapse.    Review of records from NP [name] significant for: Started feeling vaginal pressure in June after carrying heavy chairs. Has a history of cystocele and rectocele repair as well as TAH, BSO.   Urinary Symptoms: Leaks urine with going from sitting to standing after urination- depends on position. Just drops of urine.  Leaks 1 time(s) per day  Pad use: 1 liners/ mini-pads per day.   She is bothered by her UI symptoms.  Day time voids 4-5.  Nocturia: 1 times per night to void. Voiding dysfunction: she does not empty her bladder well.  does not use a catheter to empty bladder.  When urinating, she feels a weak stream, difficulty starting urine stream, and dribbling after finishing  UTIs: 2-3 UTI's in the last year.  Had gone to urgent care. Usually takes ciprofloxacin.  Reports history of blood in urine with urinary tract infections. Had a renal US a few years ago.   Pelvic Organ Prolapse Symptoms:                  She Admits to a feeling of a bulge the vaginal area. It has been present for 1 year.  She Denies seeing a bulge.  This bulge is bothersome. Notices it more when carrying things. In 2006, had posterior and enterocele repair with colposuspension.   Bowel Symptom: Bowel movements: every 2-3 days Stool consistency: hard Straining: no.  Splinting: no.  Incomplete evacuation: no.  She Denies accidental bowel leakage / fecal incontinence Bowel regimen: none Last colonoscopy: Date 2021-cologuard negative  Sexual Function Sexually active: yes.  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   Diabetes mellitus without complication ([place])    Frequent UTI     h/o since hysterectomy/04/01/13 asymptomatic   GERD (gastroesophageal reflux disease)    Headache(784.0)    migraines   Hypertension      Past Surgical History:   Past Surgical History:  Procedure Laterality Date   ABDOMINAL HYSTERECTOMY  2014   Stromal ovarian cancer   CHOLECYSTECTOMY     ENTEROCELE REPAIR     KNEE ARTHROSCOPY Right    KNEE ARTHROSCOPY Right 07/01/2014   Procedure: RIGHT ARTHROSCOPY KNEE WITH SYNOVECTOMY;  Surgeon: Lindsey Alf, MD;  Location: [facility];  Service: Orthopedics;  Laterality: Right;   RECTOCELE REPAIR     TONSILLECTOMY     TOTAL KNEE ARTHROPLASTY Right 04/07/2013   Procedure: RIGHT TOTAL KNEE ARTHROPLASTY;  Surgeon: Lindsey Alf, MD;  Location: [facility];  Service: Orthopedics;  Laterality: Right;     Past OB/GYN History: OB History  Gravida Para Term Preterm AB Living  '2 1 1     2  '$ SAB IAB Ectopic Multiple Live Births          2    # Outcome Date GA Lbr Len/2nd Weight Sex Delivery Anes PTL Lv  2 Term      Vag-Spont  1 Gravida      Vag-Spont      S/p hysterectomy   Medications: She has a current medication list which includes the following prescription(s): aspirin ec, calcium carb-cholecalciferol, estradiol, lisinopril-hydrochlorothiazide, lorazepam, omega-3 acid ethyl esters, omeprazole, pravastatin, ozempic (0.25 or 0.5 mg/dose), sumatriptan, synjardy xr, tizanidine, and zolpidem.   Allergies: Patient is allergic to amoxicillin, trulicity [dulaglutide], citalopram, erythromycin, hydrocodone, sertraline, sulfa antibiotics, tramadol, trazodone and nefazodone, and venlafaxine.   Social History:  Social History   Tobacco Use   Smoking status: Never   Smokeless tobacco: Never  Vaping  Use   Vaping Use: Never used  Substance Use Topics   Alcohol use: No   Drug use: No    Relationship status: married She lives with her spouse.   She is employed as an Web designer. Regular exercise: No History of abuse: No  Family History:   Family History  Problem Relation Age of Onset   Hyperlipidemia Mother    Hypertension Mother    Transient ischemic attack Mother    Hyperlipidemia Father    Hypertension Father    Lung cancer Father      Review of Systems: Review of Systems  Constitutional:  Negative for fever, malaise/fatigue and weight loss.  Respiratory:  Negative for cough, shortness of breath and wheezing.   Cardiovascular:  Negative for chest pain, palpitations and leg swelling.  Gastrointestinal:  Negative for abdominal pain and blood in stool.  Genitourinary:  Negative for dysuria.  Musculoskeletal:  Negative for myalgias.  Skin:  Negative for rash.  Neurological:  Positive for headaches. Negative for dizziness.  Endo/Heme/Allergies:  Does not bruise/bleed easily.       + hot flashes  Psychiatric/Behavioral:  Negative for depression. The patient is not nervous/anxious.     OBJECTIVE Physical Exam: Vitals:   05/05/21 0938  BP: 123/79  Pulse: 89  Weight: 156 lb (70.8 kg)  Height: '5\' 4"'$  (1.626 m)    Physical Exam Constitutional:      General: She is not in acute distress. Pulmonary:     Effort: Pulmonary effort is normal.  Abdominal:     General: There is no distension.     Palpations: Abdomen is soft.     Tenderness: There is no abdominal tenderness. There is no rebound.  Musculoskeletal:        General: No swelling. Normal range of motion.  Skin:    General: Skin is warm and dry.     Findings: No rash.  Neurological:     Mental Status: She is alert and oriented to person, place, and time.  Psychiatric:        Mood and Affect: Mood normal.        Behavior: Behavior normal.     GU / Detailed Urogynecologic Evaluation:   Pelvic Exam: Normal external female genitalia; Bartholin's and Skene's glands normal in appearance; urethral meatus normal in appearance, no urethral masses or discharge.   CST: negative   s/p hysterectomy: Speculum exam reveals normal vaginal mucosa without  atrophy and normal vaginal cuff.  Adnexa normal adnexa.    Pelvic floor strength II/V  Pelvic floor musculature: Right levator non-tender, Right obturator non-tender, Left levator non-tender, Left obturator non-tender  POP-Q:   POP-Q  0  Aa   0                                           Ba  -7                                              C   2                                            Gh  5                                            Pb  7.5                                            tvl   -2                                            Ap  -2                                            Bp                                                 D     Rectal Exam:  Normal external rectum  Post-Void Residual (PVR) by Bladder Scan: In order to evaluate bladder emptying, we discussed obtaining a postvoid residual and she agreed to this procedure.  Procedure: The ultrasound unit was placed on the patient's abdomen in the suprapubic region after the patient had voided. A PVR of 73 ml was obtained by bladder scan.  Laboratory Results: POC urine: trace blood    ASSESSMENT AND PLAN Ms. Gal is a 65 y.o. with:  1. Prolapse of anterior vaginal wall   2. Urinary frequency   3. Recurrent UTI   4. Vaginal atrophy   5. SUI (stress urinary incontinence, female)    Stage II anterior, Stage I posterior, Stage I apical prolapse - For treatment of pelvic organ prolapse, we discussed options for management including expectant management, conservative management, and surgical management, such as Kegels, a pessary, pelvic floor physical therapy, and specific surgical procedures (anterior  repair). - She is interested in a pessary and then possibly surgery in the future.   rUTI/ atrophy - will start prophylaxis with estrace cream 0.5g nightly for two weeks then twice a week after. We discussed the risks are very low with vaginal estrogen use due to the very low systemic absorption rate of ~ 0.01% with a twice-week  regimen.  3. SUI For treatment of stress urinary incontinence,  non-surgical options include expectant management, weight loss, physical therapy, as well as a pessary.  Surgical options include a midurethral sling, or of a bulking agent. - She would like to try a pessary  Return for pessary fitting  [name], MD   Medical Decision Making:  - Reviewed/ ordered a clinical laboratory test - Review and summation of prior records

## 2021-05-06 ENCOUNTER — Ambulatory Visit: Payer: BC Managed Care – PPO | Admitting: Obstetrics and Gynecology

## 2021-05-06 LAB — COMPREHENSIVE METABOLIC PANEL
ALT: 10 IU/L (ref 0–32)
AST: 14 IU/L (ref 0–40)
Albumin/Globulin Ratio: 1.5 (ref 1.2–2.2)
Albumin: 4.2 g/dL (ref 3.8–4.8)
Alkaline Phosphatase: 83 IU/L (ref 44–121)
BUN/Creatinine Ratio: 19 (ref 12–28)
BUN: 16 mg/dL (ref 8–27)
Bilirubin Total: 0.4 mg/dL (ref 0.0–1.2)
CO2: 22 mmol/L (ref 20–29)
Calcium: 10 mg/dL (ref 8.7–10.3)
Chloride: 97 mmol/L (ref 96–106)
Creatinine, Ser: 0.85 mg/dL (ref 0.57–1.00)
Globulin, Total: 2.8 g/dL (ref 1.5–4.5)
Glucose: 101 mg/dL — ABNORMAL HIGH (ref 65–99)
Potassium: 4.6 mmol/L (ref 3.5–5.2)
Sodium: 136 mmol/L (ref 134–144)
Total Protein: 7 g/dL (ref 6.0–8.5)
eGFR: 77 mL/min/{1.73_m2} (ref >59)

## 2021-05-06 LAB — LIPID PANEL
Chol/HDL Ratio: 2.2 ratio (ref 0.0–4.4)
Cholesterol, Total: 123 mg/dL (ref 100–199)
HDL: 56 mg/dL (ref >39)
LDL Chol Calc (NIH): 46 mg/dL (ref 0–99)
Triglycerides: 117 mg/dL (ref 0–149)
VLDL Cholesterol Cal: 21 mg/dL (ref 5–40)

## 2021-05-06 LAB — CBC WITH DIFFERENTIAL/PLATELET
Basophils Absolute: 0.1 10*3/uL (ref 0.0–0.2)
Basos: 1 %
EOS (ABSOLUTE): 0.5 10*3/uL — ABNORMAL HIGH (ref 0.0–0.4)
Eos: 7 %
Hematocrit: 43.7 % (ref 34.0–46.6)
Hemoglobin: 14.2 g/dL (ref 11.1–15.9)
Immature Grans (Abs): 0 10*3/uL (ref 0.0–0.1)
Immature Granulocytes: 0 %
Lymphocytes Absolute: 1.9 10*3/uL (ref 0.7–3.1)
Lymphs: 27 %
MCH: 27.2 pg (ref 26.6–33.0)
MCHC: 32.5 g/dL (ref 31.5–35.7)
MCV: 84 fL (ref 79–97)
Monocytes Absolute: 0.5 10*3/uL (ref 0.1–0.9)
Monocytes: 7 %
Neutrophils Absolute: 4.2 10*3/uL (ref 1.4–7.0)
Neutrophils: 58 %
Platelets: 477 10*3/uL — ABNORMAL HIGH (ref 150–450)
RBC: 5.22 x10E6/uL (ref 3.77–5.28)
RDW: 13.8 % (ref 11.7–15.4)
WBC: 7.3 10*3/uL (ref 3.4–10.8)

## 2021-05-06 LAB — HEMOGLOBIN A1C
Est. average glucose Bld gHb Est-mCnc: 137 mg/dL
Hgb A1c MFr Bld: 6.4 % — ABNORMAL HIGH (ref 4.8–5.6)

## 2021-05-06 LAB — CARDIOVASCULAR RISK ASSESSMENT

## 2021-05-10 ENCOUNTER — Encounter: Payer: Self-pay | Admitting: Family Medicine

## 2021-05-10 ENCOUNTER — Other Ambulatory Visit: Payer: Self-pay

## 2021-05-10 ENCOUNTER — Ambulatory Visit: Payer: BC Managed Care – PPO | Admitting: Family Medicine

## 2021-05-10 VITALS — BP 110/78 | HR 72 | Temp 97.0°F | Resp 16 | Ht 64.0 in | Wt 155.0 lb

## 2021-05-10 DIAGNOSIS — N8110 Cystocele, unspecified: Secondary | ICD-10-CM | POA: Diagnosis not present

## 2021-05-10 DIAGNOSIS — E1121 Type 2 diabetes mellitus with diabetic nephropathy: Secondary | ICD-10-CM | POA: Insufficient documentation

## 2021-05-10 DIAGNOSIS — N3001 Acute cystitis with hematuria: Secondary | ICD-10-CM

## 2021-05-10 DIAGNOSIS — Z23 Encounter for immunization: Secondary | ICD-10-CM

## 2021-05-10 DIAGNOSIS — R3129 Other microscopic hematuria: Secondary | ICD-10-CM | POA: Insufficient documentation

## 2021-05-10 DIAGNOSIS — I10 Essential (primary) hypertension: Secondary | ICD-10-CM | POA: Diagnosis not present

## 2021-05-10 DIAGNOSIS — E782 Mixed hyperlipidemia: Secondary | ICD-10-CM

## 2021-05-10 DIAGNOSIS — G43009 Migraine without aura, not intractable, without status migrainosus: Secondary | ICD-10-CM

## 2021-05-10 DIAGNOSIS — K219 Gastro-esophageal reflux disease without esophagitis: Secondary | ICD-10-CM

## 2021-05-10 LAB — POCT UA - MICROALBUMIN: Microalbumin Ur, POC: 80 mg/L

## 2021-05-10 LAB — POCT URINALYSIS DIPSTICK
Bilirubin, UA: NEGATIVE
Glucose, UA: POSITIVE — AB
Ketones, UA: NEGATIVE
Nitrite, UA: NEGATIVE
Protein, UA: POSITIVE — AB
Spec Grav, UA: 1.015 (ref 1.010–1.025)
Urobilinogen, UA: 0.2 E.U./dL
pH, UA: 6 (ref 5.0–8.0)

## 2021-05-10 MED ORDER — LISINOPRIL 40 MG PO TABS: 40.0000 mg | ORAL_TABLET | Freq: Every day | ORAL | 0 refills | Status: DC

## 2021-05-10 MED ORDER — CIPROFLOXACIN HCL 250 MG PO TABS: 250.0000 mg | ORAL_TABLET | Freq: Two times a day (BID) | ORAL | 0 refills | Status: AC

## 2021-05-10 NOTE — Patient Instructions (Signed)
Recommend start on lisinopril 40 mg once daily Stop lisinopril/hctz when receives rx above from [facility].   Start on cipro for bladder infection.

## 2021-05-10 NOTE — Assessment & Plan Note (Signed)
Control: great Recommend check sugars fasting daily. Recommend check feet daily. Recommend annual eye exams. Medicines: Change lisinopril/hctz 20/25 mg once daily to lisinopril 40 mg daily. The current medical regimen is effective for diabetes control;  continue present plan and medications. Continue to work on eating a healthy diet and exercise.  Labs reviewed.

## 2021-05-10 NOTE — Assessment & Plan Note (Signed)
The current medical regimen is effective;  continue present plan and medications.  

## 2021-05-10 NOTE — Assessment & Plan Note (Addendum)
Well controlled.  Change lisinopril/hctz to lisinopril 40 mg once daily due to nephropathy.  Continue to work on eating a healthy diet and exercise.  Labs reviewed.

## 2021-05-10 NOTE — Assessment & Plan Note (Signed)
Start cipro.

## 2021-05-10 NOTE — Assessment & Plan Note (Signed)
Well controlled.  °No changes to medicines.  °Continue to work on eating a healthy diet and exercise.  °Labs reviewed.  °

## 2021-05-10 NOTE — Progress Notes (Signed)
Subjective:  Patient ID: Lindsey [name], female    DOB: Nov 06, 1955  Age: 65 y.o. MRN: [id number]  Chief Complaint  Patient presents with   Diabetes   Hypertension   Hyperlipidemia     HPI Diabetes:  Complications: nephropathy. Glucose checking: once daily.  Glucose logs:102-128 Hypoglycemia:no Most recent A1C: Current medications: 0zempic 0.5 mg weekly, synjardy xr 12.01/999 mg 2 in am. Last Eye Exam: 03/2021 Foot checks: yes Lifestyle changes: eating much better. Fell and twisted her ankle 2 weeks ago. Pt was seen at Urgent care. Had an ankle sprain. Improved.   Hyperlipidemia: Current medications: lovaza 1 gm 2 bid,  and pravastatin 10 mg once daily.  Insomnia: ambien cr helps.   Migraines: Occasional. Uses imitrex. Uses tizanidine for intermittent muscle pain.  GERD: on omeprazole 20 mg once daily   Hypertension: Current medications:lisinopril/hctz 20/25 mg once daily. Bp is well controlled.   Prolapsed bladder: pt saw uro gynecology. Estradol and Ring pessary. UA was fine there.  Began with dysuria this morning. Was a sharp pain.   Current Outpatient Medications on File Prior to Visit  Medication Sig Dispense Refill   aspirin EC 81 MG tablet Take 81 mg by mouth every morning.     Calcium Carb-Cholecalciferol (CALCIUM 600 + D PO) Take 1 tablet by mouth at bedtime.     estradiol (ESTRACE) 0.1 MG/GM vaginal cream Place 0.5 g vaginally 2 (two) times a week. Place 0.5g nightly for two weeks then twice a week after 30 g 11   LORazepam (ATIVAN) 0.5 MG tablet TAKE 1 TABLET BY MOUTH ONCE DAILY AS NEEDED FOR ANXIETY. 30 tablet 1   omega-3 acid ethyl esters (LOVAZA) 1 g capsule TAKE 2 CAPSULES BY MOUTH  TWICE DAILY 360 capsule 3   omeprazole (PRILOSEC) 20 MG capsule TAKE 1 CAPSULE BY MOUTH ONCE DAILY BEFORE A MEAL 90 capsule 3   pravastatin (PRAVACHOL) 10 MG tablet TAKE 1 TABLET BY MOUTH ONCE DAILY 90 tablet 3   Semaglutide,0.25 or 0.'5MG'$ /DOS, (OZEMPIC, 0.25 OR 0.5 MG/DOSE,) 2  MG/1.5ML SOPN Inject 0.5 mg into the skin once a week. 4.5 mL 1   SUMAtriptan (IMITREX) 100 MG tablet One at onset of aura/migraine, repeat in 2 hours x 1 per 24 hours. 27 tablet 1   SYNJARDY XR 12.01-999 MG TB24 TAKE 2 TABLETS BY MOUTH  DAILY 180 tablet 3   tizanidine (ZANAFLEX) 2 MG capsule Take 1 capsule (2 mg total) by mouth daily as needed for muscle spasms. 90 capsule 1   zolpidem (AMBIEN CR) 12.5 MG CR tablet TAKE 1 TABLET BY MOUTH AT BEDTIME 90 tablet 1   No current facility-administered medications on file prior to visit.   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   Diabetes mellitus without complication ([place])    Frequent UTI     h/o since hysterectomy/04/01/13 asymptomatic   GERD (gastroesophageal reflux disease)    Headache(784.0)    migraines   Hypertension    Past Surgical History:  Procedure Laterality Date   ABDOMINAL HYSTERECTOMY  2014   Stromal ovarian cancer   CHOLECYSTECTOMY     ENTEROCELE REPAIR     KNEE ARTHROSCOPY Right    KNEE ARTHROSCOPY Right 07/01/2014   Procedure: RIGHT ARTHROSCOPY KNEE WITH SYNOVECTOMY;  Surgeon: [name], MD;  Location: [facility];  Service: Orthopedics;  Laterality: Right;   RECTOCELE REPAIR     TONSILLECTOMY  TOTAL KNEE ARTHROPLASTY Right 04/07/2013   Procedure: RIGHT TOTAL KNEE ARTHROPLASTY;  Surgeon: [name], MD;  Location: [facility];  Service: Orthopedics;  Laterality: Right;    Family History  Problem Relation Age of Onset   Hyperlipidemia Mother    Hypertension Mother    Transient ischemic attack Mother    Hyperlipidemia Father    Hypertension Father    Lung cancer Father    Social History   Socioeconomic History   Marital status: Married    Spouse name: Not on file   Number of children: Not on file   Years of education: Not on file   Highest education level: Not on file  Occupational History   Not on file  Tobacco Use   Smoking status: Never    Smokeless tobacco: Never  Vaping Use   Vaping Use: Never used  Substance and Sexual Activity   Alcohol use: No   Drug use: No   Sexual activity: Yes    Birth control/protection: Surgical  Other Topics Concern   Not on file  Social History Narrative   Not on file   Social Determinants of Health   Financial Resource Strain: Not on file  Food Insecurity: Not on file  Transportation Needs: Not on file  Physical Activity: Not on file  Stress: Not on file  Social Connections: Not on file    Review of Systems  Constitutional:  Negative for chills, fatigue and fever.  HENT:  Negative for congestion, rhinorrhea and sore throat.   Respiratory:  Negative for cough and shortness of breath.   Cardiovascular:  Negative for chest pain.  Gastrointestinal:  Negative for abdominal pain, constipation, diarrhea, nausea and vomiting.  Genitourinary:  Positive for dysuria. Negative for urgency.  Musculoskeletal:  Negative for back pain and myalgias.  Neurological:  Negative for dizziness, weakness, light-headedness and headaches.  Psychiatric/Behavioral:  Negative for dysphoric mood. The patient is not nervous/anxious.     Objective:  BP 110/78   Pulse 72   Temp (!) 97 F (36.1 C)   Resp 16   Ht '5\' 4"'$  (1.626 m)   Wt 155 lb (70.3 kg)   BMI 26.61 kg/m   BP/Weight 05/10/2021 05/05/2021 [id number]  Systolic BP [id number]  Diastolic BP 78 79 72  Wt. (Lbs) 155 156 160.4  BMI 26.61 26.78 27.53    Physical Exam Vitals reviewed.  Constitutional:      Appearance: Normal appearance. She is normal weight.  Neck:     Vascular: No carotid bruit.  Cardiovascular:     Rate and Rhythm: Normal rate and regular rhythm.     Pulses: Normal pulses.     Heart sounds: Normal heart sounds.  Pulmonary:     Effort: Pulmonary effort is normal. No respiratory distress.     Breath sounds: Normal breath sounds.  Abdominal:     General: Abdomen is flat. Bowel sounds are normal.     Palpations: Abdomen is  soft.     Tenderness: There is no abdominal tenderness.  Neurological:     Mental Status: She is alert and oriented to person, place, and time.  Psychiatric:        Mood and Affect: Mood normal.        Behavior: Behavior normal.    Diabetic Foot Exam - Simple   Simple Foot Form Diabetic Foot exam was performed with the following findings: Yes 05/10/2021  8:08 AM  Visual Inspection No deformities, no ulcerations, no other skin breakdown  bilaterally: Yes Sensation Testing Intact to touch and monofilament testing bilaterally: Yes Pulse Check Posterior Tibialis and Dorsalis pulse intact bilaterally: Yes Comments      Lab Results  Component Value Date   WBC 7.3 05/05/2021   HGB 14.2 05/05/2021   HCT 43.7 05/05/2021   PLT 477 (H) 05/05/2021   GLUCOSE 101 (H) 05/05/2021   CHOL 123 05/05/2021   TRIG 117 05/05/2021   HDL 56 05/05/2021   LDLCALC 46 05/05/2021   ALT 10 05/05/2021   AST 14 05/05/2021   NA 136 05/05/2021   K 4.6 05/05/2021   CL 97 05/05/2021   CREATININE 0.85 05/05/2021   BUN 16 05/05/2021   CO2 22 05/05/2021   INR 0.92 04/01/2013   HGBA1C 6.4 (H) 05/05/2021   MICROALBUR 80 05/10/2021      Assessment & Plan:   Problem List Items Addressed This Visit       Cardiovascular and Mediastinum   Essential hypertension, benign    Well controlled.  Change lisinopril/hctz to lisinopril 40 mg once daily due to nephropathy.  Continue to work on eating a healthy diet and exercise.  Labs reviewed.       Relevant Medications   lisinopril (ZESTRIL) 40 MG tablet   Migraine without aura and without status migrainosus, not intractable    The current medical regimen is effective;  continue present plan and medications.       Relevant Medications   lisinopril (ZESTRIL) 40 MG tablet     Digestive   Gastroesophageal reflux disease without esophagitis    The current medical regimen is effective;  continue present plan and medications.         Endocrine    Diabetic glomerulopathy (HCC)    Control: great Recommend check sugars fasting daily. Recommend check feet daily. Recommend annual eye exams. Medicines: Change lisinopril/hctz 20/25 mg once daily to lisinopril 40 mg daily. The current medical regimen is effective for diabetes control;  continue present plan and medications. Continue to work on eating a healthy diet and exercise.  Labs reviewed.        Relevant Medications   lisinopril (ZESTRIL) 40 MG tablet   Other Relevant Orders   POCT UA - Microalbumin (Completed)     Genitourinary   Acute cystitis with hematuria - Primary    Start cipro.        Relevant Medications   ciprofloxacin (CIPRO) 250 MG tablet   Other Relevant Orders   Urine Culture   POCT urinalysis dipstick (Completed)   Bladder prolapse, female, acquired     Other   Mixed hyperlipidemia    Well controlled.  No changes to medicines.  Continue to work on eating a healthy diet and exercise.  Labs reviewed.         Relevant Medications   lisinopril (ZESTRIL) 40 MG tablet   Other Visit Diagnoses     Needs flu shot       Encounter for immunization       Relevant Orders   Flu Vaccine MDCK QUAD PF (Completed)         Meds ordered this encounter  Medications   ciprofloxacin (CIPRO) 250 MG tablet    Sig: Take 1 tablet (250 mg total) by mouth 2 (two) times daily for 7 days.    Dispense:  14 tablet    Refill:  0   lisinopril (ZESTRIL) 40 MG tablet    Sig: Take 1 tablet (40 mg total) by mouth daily.  Dispense:  90 tablet    Refill:  0    Orders Placed This Encounter  Procedures   Urine Culture   Flu Vaccine MDCK QUAD PF   POCT UA - Microalbumin   POCT urinalysis dipstick     Follow-up: Return in about 6 weeks (around 06/21/2021).  An After Visit Summary was printed and given to the patient.  Rochel Brome, MD [facility] [phone]

## 2021-05-11 LAB — URINE CULTURE

## 2021-05-13 ENCOUNTER — Ambulatory Visit: Payer: BC Managed Care – PPO | Admitting: Obstetrics and Gynecology

## 2021-05-18 ENCOUNTER — Other Ambulatory Visit: Payer: Self-pay | Admitting: Family Medicine

## 2021-05-24 ENCOUNTER — Other Ambulatory Visit: Payer: Self-pay | Admitting: Family Medicine

## 2021-06-20 ENCOUNTER — Encounter: Payer: Self-pay | Admitting: Family Medicine

## 2021-06-21 ENCOUNTER — Ambulatory Visit: Payer: BC Managed Care – PPO | Admitting: Family Medicine

## 2021-06-21 DIAGNOSIS — R519 Headache, unspecified: Secondary | ICD-10-CM | POA: Diagnosis not present

## 2021-06-21 DIAGNOSIS — G43909 Migraine, unspecified, not intractable, without status migrainosus: Secondary | ICD-10-CM | POA: Diagnosis not present

## 2021-06-27 NOTE — Progress Notes (Signed)
[facility]   Subjective:     Chief Complaint: No chief complaint on file.  History of Present Illness: Lindsey [name] is a 65 y.o. female with stage II pelvic organ prolapse and stress incontinence who presents today for a pessary fitting.    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   Past Surgical History: She  has a past surgical history that includes Tonsillectomy; Rectocele repair; Enterocele repair; Cholecystectomy; Knee arthroscopy (Right); Total knee arthroplasty (Right, 04/07/2013); Knee arthroscopy (Right, 07/01/2014); and Abdominal hysterectomy (2014).   Medications: She has a current medication list which includes the following prescription(s): aspirin ec, calcium carb-cholecalciferol, estradiol, lisinopril, lorazepam, omega-3 acid ethyl esters, omeprazole, pravastatin, ozempic (0.25 or 0.5 mg/dose), sumatriptan, synjardy xr, tizanidine, and zolpidem.   Allergies: Patient is allergic to amoxicillin, trulicity [dulaglutide], citalopram, erythromycin, hydrocodone, sertraline, sulfa antibiotics, tramadol, trazodone and nefazodone, and venlafaxine.   Social History: Patient  reports that she has never smoked. She has never used smokeless tobacco. She reports that she does not drink alcohol and does not use drugs.      Objective:    There were no vitals taken for this visit. Gen: No apparent distress, A&O x 3. Pelvic Exam: Normal external female genitalia; Bartholin's and Skene's glands normal in appearance; urethral meatus normal in appearance, no urethral masses or discharge.   A size #2 ring pessary was placed for sizing. This fit well and was comfortable. A size #0 and #2 incontinence dish with support was tried. These were comfortable but  fell down with walking. A #0 gehrung with a knob pessary was fitted. It was comfortable, stayed in place with valsalva, standing and bending, and was an appropriate size on examination, with one finger fitting between the pessary and the vaginal walls. She demonstrated proper placement and removal.   POP-Q (05/05/21):    POP-Q   0                                            Aa   0                                           Ba   -7                                              C    2                                            Gh   5                                            Pb   7.5  tvl    -2                                            Ap   -2                                            Bp                                                  D        Assessment/Plan:    Assessment: Lindsey [name] is a 65 y.o. with stage II pelvic organ prolapse and stress incontinence who presents for a pessary fitting.  Plan: She was fitted with a #0 gehrung with a knob pessary. She will remove at least weekly or prior to intercourse .   Follow-up in 2-3 weeks for a pessary check or sooner as needed.  All questions were answered.    [name], MD

## 2021-06-28 ENCOUNTER — Encounter: Payer: Self-pay | Admitting: Obstetrics and Gynecology

## 2021-06-28 ENCOUNTER — Ambulatory Visit: Payer: BC Managed Care – PPO | Admitting: Obstetrics and Gynecology

## 2021-06-28 ENCOUNTER — Other Ambulatory Visit: Payer: Self-pay

## 2021-06-28 VITALS — BP 154/95 | HR 60 | Wt 155.0 lb

## 2021-06-28 DIAGNOSIS — N8110 Cystocele, unspecified: Secondary | ICD-10-CM

## 2021-06-28 NOTE — Progress Notes (Signed)
Subjective:  Patient ID: Lindsey [name], female    DOB: [birth year]  Age: 65 y.o. MRN: [id number]  Chief Complaint  Patient presents with   6 week follow up for UTI    HPI Patient is here for 6 weeks follow up due to Acute cystitis with hematuria. Patient was treated with Ciprofloxacin 250 mg twice daily.  Hypertension:  Changed Lisinopril/hctz to lisinopril 40 mg once daily due to nephropathy. Bp is good.   Pt had a horrible headache one week ago with nausea and vomiting all night. Pt went to ED and was given migraine cocktail. Better now.   Diabetes: sugars good 100-120. No low sugars. Daily foot checks. Eye exam 2022. Healthy diet.   Current Outpatient Medications on File Prior to Visit  Medication Sig Dispense Refill   aspirin EC 81 MG tablet Take 81 mg by mouth every morning.     Calcium Carb-Cholecalciferol (CALCIUM 600 + D PO) Take 1 tablet by mouth at bedtime.     estradiol (ESTRACE) 0.1 MG/GM vaginal cream Place 0.5 g vaginally 2 (two) times a week. Place 0.5g nightly for two weeks then twice a week after 30 g 11   ibuprofen (ADVIL) 600 MG tablet Take 600 mg by mouth every 6 (six) hours as needed.     lisinopril (ZESTRIL) 40 MG tablet Take 1 tablet (40 mg total) by mouth daily. 90 tablet 0   LORazepam (ATIVAN) 0.5 MG tablet TAKE 1 TABLET BY MOUTH ONCE DAILY AS NEEDED FOR ANXIETY. 30 tablet 1   omega-3 acid ethyl esters (LOVAZA) 1 g capsule TAKE 2 CAPSULES BY MOUTH  TWICE DAILY 360 capsule 3   omeprazole (PRILOSEC) 20 MG capsule TAKE 1 CAPSULE BY MOUTH ONCE DAILY BEFORE A MEAL 90 capsule 3   pravastatin (PRAVACHOL) 10 MG tablet TAKE 1 TABLET BY MOUTH ONCE DAILY 90 tablet 3   prochlorperazine (COMPAZINE) 10 MG tablet Take 10 mg by mouth every 6 (six) hours.     Semaglutide,0.25 or 0.5MG /DOS, (OZEMPIC, 0.25 OR 0.5 MG/DOSE,) 2 MG/1.5ML SOPN Inject 0.5 mg into the skin once a week. 4.5 mL 1   SUMAtriptan (IMITREX) 100 MG tablet TAKE 1 TABLET BY MOUTH AT  ONSET OF MIGRAINE MAY   REPEAT IN 2 HOURS AS NEEDED MAX OF 2 TABLETS PER DAY 27 tablet 1   SYNJARDY XR 12.01-999 MG TB24 TAKE 2 TABLETS BY MOUTH  DAILY 180 tablet 3   tizanidine (ZANAFLEX) 2 MG capsule Take 1 capsule (2 mg total) by mouth daily as needed for muscle spasms. 90 capsule 1   zolpidem (AMBIEN CR) 12.5 MG CR tablet TAKE 1 TABLET BY MOUTH AT BEDTIME 90 tablet 0   No current facility-administered medications on file prior to visit.   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 Overview:  The patient in 09/2009 underwent TAHBSO with the findings of a ruptured 10 cm right ovarian mass. 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   Past Surgical History:  Procedure Laterality Date   ABDOMINAL HYSTERECTOMY  2014   Stromal ovarian cancer  CHOLECYSTECTOMY     ENTEROCELE REPAIR     KNEE ARTHROSCOPY Right    KNEE ARTHROSCOPY Right 07/01/2014   Procedure: RIGHT ARTHROSCOPY KNEE WITH SYNOVECTOMY;  Surgeon: [name], MD;  Location: [facility];  Service: Orthopedics;  Laterality: Right;   RECTOCELE REPAIR     TONSILLECTOMY     TOTAL KNEE ARTHROPLASTY Right 04/07/2013   Procedure: RIGHT TOTAL KNEE ARTHROPLASTY;  Surgeon: [name], MD;  Location: [facility];  Service: Orthopedics;  Laterality: Right;    Family History  Problem Relation Age of Onset   Hyperlipidemia Mother    Hypertension Mother    Transient ischemic attack Mother    Hyperlipidemia Father    Hypertension Father    Lung cancer Father    Social History   Socioeconomic History   Marital status: Married    Spouse name: Not on file   Number of children: Not on file    Years of education: Not on file   Highest education level: Not on file  Occupational History   Not on file  Tobacco Use   Smoking status: Never   Smokeless tobacco: Never  Vaping Use   Vaping Use: Never used  Substance and Sexual Activity   Alcohol use: No   Drug use: No   Sexual activity: Yes    Birth control/protection: Surgical  Other Topics Concern   Not on file  Social History Narrative   Not on file   Social Determinants of Health   Financial Resource Strain: Not on file  Food Insecurity: Not on file  Transportation Needs: Not on file  Physical Activity: Not on file  Stress: Not on file  Social Connections: Not on file    Review of Systems  Constitutional:  Negative for fatigue and fever.  HENT:  Negative for congestion, ear pain and sore throat.   Respiratory:  Negative for cough and shortness of breath.   Cardiovascular:  Negative for chest pain.  Gastrointestinal:  Negative for abdominal pain, constipation, diarrhea, nausea and vomiting.  Genitourinary:  Negative for dysuria, frequency and urgency.  Musculoskeletal:  Negative for arthralgias, back pain and myalgias.  Neurological:  Negative for dizziness and headaches.  Psychiatric/Behavioral:  Negative for agitation, dysphoric mood and sleep disturbance. The patient is not nervous/anxious.     Objective:  BP 120/78 (BP Location: Right Arm, Patient Position: Sitting, Cuff Size: Normal)   Pulse 88   Temp (!) 96.9 F (36.1 C) (Temporal)   Ht 5\' 4"  (1.626 m)   Wt 151 lb (68.5 kg)   SpO2 96%   BMI 25.92 kg/m   BP/Weight 06/29/2021 86/76/7209 12/09/960  Systolic BP 836 629 476  Diastolic BP 78 95 78  Wt. (Lbs) 151 155 155  BMI 25.92 26.61 26.61    Physical Exam Vitals reviewed.  Constitutional:      Appearance: Normal appearance.  Cardiovascular:     Rate and Rhythm: Normal rate and regular rhythm.  Pulmonary:     Effort: Pulmonary effort is normal.     Breath sounds: Normal breath sounds.   Neurological:     Mental Status: She is alert.  Psychiatric:        Mood and Affect: Mood normal.        Behavior: Behavior normal.    Diabetic Foot Exam - Simple   No data filed      Lab Results  Component Value Date   WBC 7.3 05/05/2021   HGB 14.2 05/05/2021   HCT 43.7  05/05/2021   PLT 477 (H) 05/05/2021   GLUCOSE 101 (H) 05/05/2021   CHOL 123 05/05/2021   TRIG 117 05/05/2021   HDL 56 05/05/2021   LDLCALC 46 05/05/2021   ALT 10 05/05/2021   AST 14 05/05/2021   NA 136 05/05/2021   K 4.6 05/05/2021   CL 97 05/05/2021   CREATININE 0.85 05/05/2021   BUN 16 05/05/2021   CO2 22 05/05/2021   INR 0.92 04/01/2013   HGBA1C 6.4 (H) 05/05/2021   MICROALBUR 10 06/29/2021      Assessment & Plan:   Problem List Items Addressed This Visit       Cardiovascular and Mediastinum   Essential hypertension, benign    Well controlled.  Continue lisinopril 40 mg once daily.      Relevant Orders   Comprehensive metabolic panel     Endocrine   Diabetic glomerulopathy ([place])    Microalbumin improved to 10 with change in lisinopril.  Keep up good work.      Relevant Orders   POCT UA - Microalbumin (Completed)     Genitourinary   Microscopic hematuria - Primary    Likely secondary to pessary fitting done at gynecology. Will send for culture just in case.      .  No orders of the defined types were placed in this encounter.   Orders Placed This Encounter  Procedures   Comprehensive metabolic panel   POCT URINALYSIS DIP (CLINITEK)   POCT UA - Microalbumin     Follow-up: Return in about 6 weeks (around 08/10/2021).  An After Visit Summary was printed and given to the patient.  Rochel Brome, MD [facility] [phone]

## 2021-06-29 ENCOUNTER — Ambulatory Visit: Payer: BC Managed Care – PPO | Admitting: Family Medicine

## 2021-06-29 ENCOUNTER — Encounter: Payer: Self-pay | Admitting: Family Medicine

## 2021-06-29 VITALS — BP 120/78 | HR 88 | Temp 96.9°F | Ht 64.0 in | Wt 151.0 lb

## 2021-06-29 DIAGNOSIS — R3129 Other microscopic hematuria: Secondary | ICD-10-CM | POA: Diagnosis not present

## 2021-06-29 DIAGNOSIS — I10 Essential (primary) hypertension: Secondary | ICD-10-CM | POA: Diagnosis not present

## 2021-06-29 DIAGNOSIS — E1121 Type 2 diabetes mellitus with diabetic nephropathy: Secondary | ICD-10-CM | POA: Diagnosis not present

## 2021-06-29 DIAGNOSIS — N3001 Acute cystitis with hematuria: Secondary | ICD-10-CM

## 2021-06-29 LAB — COMPREHENSIVE METABOLIC PANEL
ALT: 11 IU/L (ref 0–32)
AST: 14 IU/L (ref 0–40)
Albumin/Globulin Ratio: 2.3 — ABNORMAL HIGH (ref 1.2–2.2)
Albumin: 4.5 g/dL (ref 3.8–4.8)
Alkaline Phosphatase: 74 IU/L (ref 44–121)
BUN/Creatinine Ratio: 15 (ref 12–28)
BUN: 13 mg/dL (ref 8–27)
Bilirubin Total: 0.5 mg/dL (ref 0.0–1.2)
CO2: 23 mmol/L (ref 20–29)
Calcium: 10 mg/dL (ref 8.7–10.3)
Chloride: 98 mmol/L (ref 96–106)
Creatinine, Ser: 0.85 mg/dL (ref 0.57–1.00)
Globulin, Total: 2 g/dL (ref 1.5–4.5)
Glucose: 96 mg/dL (ref 70–99)
Potassium: 4.7 mmol/L (ref 3.5–5.2)
Sodium: 135 mmol/L (ref 134–144)
Total Protein: 6.5 g/dL (ref 6.0–8.5)
eGFR: 76 mL/min/{1.73_m2} (ref >59)

## 2021-06-29 LAB — POCT URINALYSIS DIP (CLINITEK)
Bilirubin, UA: NEGATIVE
Glucose, UA: >=1000 mg/dL — AB
Ketones, POC UA: NEGATIVE mg/dL
Leukocytes, UA: NEGATIVE
Nitrite, UA: NEGATIVE
Spec Grav, UA: 1.015 (ref 1.010–1.025)
Urobilinogen, UA: 0.2 E.U./dL
pH, UA: 6 (ref 5.0–8.0)

## 2021-06-29 LAB — POCT UA - MICROALBUMIN
Creatinine, POC: 10 mg/dL
Microalbumin Ur, POC: 10 mg/L

## 2021-06-29 NOTE — Assessment & Plan Note (Signed)
Likely secondary to pessary fitting done at gynecology. Will send for culture just in case.

## 2021-06-29 NOTE — Assessment & Plan Note (Signed)
Well controlled.  Continue lisinopril 40 mg once daily.

## 2021-06-29 NOTE — Assessment & Plan Note (Signed)
Microalbumin improved to 10 with change in lisinopril.  Keep up good work.

## 2021-07-04 LAB — URINE CULTURE

## 2021-07-20 ENCOUNTER — Other Ambulatory Visit: Payer: Self-pay

## 2021-07-20 ENCOUNTER — Encounter: Payer: Self-pay | Admitting: Obstetrics and Gynecology

## 2021-07-20 ENCOUNTER — Ambulatory Visit (INDEPENDENT_AMBULATORY_CARE_PROVIDER_SITE_OTHER): Payer: BC Managed Care – PPO | Admitting: Obstetrics and Gynecology

## 2021-07-20 VITALS — BP 151/94 | HR 74

## 2021-07-20 DIAGNOSIS — N393 Stress incontinence (female) (male): Secondary | ICD-10-CM | POA: Diagnosis not present

## 2021-07-20 DIAGNOSIS — N8110 Cystocele, unspecified: Secondary | ICD-10-CM

## 2021-07-20 NOTE — Progress Notes (Signed)
[facility]   Subjective:     Chief Complaint:  Chief Complaint  Patient presents with   [place] is a 65 y.o. female here for a pessary check.   History of Present Illness: Lindsey [name] is a 65 y.o. female with stage II pelvic organ prolapse who presents for a pessary check. She is using a size #0 gehrung with a knob pessary. The pessary has been working well. She is removing it once a week. She does have a little trouble taking it out and replacing it.  She denies vaginal bleeding.  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   Past Surgical History: She  has a past surgical history that includes Tonsillectomy; Rectocele repair; Enterocele repair; Cholecystectomy; Knee arthroscopy (Right); Total knee arthroplasty (Right, 04/07/2013); Knee arthroscopy (Right, 07/01/2014); and Abdominal hysterectomy (2014).   Medications: She has a current medication list which includes the following prescription(s): aspirin ec, calcium carb-cholecalciferol, estradiol, ibuprofen, lisinopril, lorazepam, omega-3 acid ethyl esters, omeprazole, pravastatin, prochlorperazine, ozempic (0.25 or 0.5 mg/dose), sumatriptan, synjardy xr, tizanidine, and zolpidem.   Allergies: Patient is allergic to amoxicillin, trulicity [dulaglutide], citalopram, erythromycin, hydrocodone, sertraline, sulfa antibiotics, tramadol, trazodone and nefazodone, and venlafaxine.   Social History: Patient  reports that she has never smoked. She has never used smokeless tobacco. She reports that she does not drink alcohol and does not use drugs.      Objective:    Physical Exam: BP (!) 151/94   Pulse 74  Gen: No apparent distress, A&O x 3. Detailed Urogynecologic Evaluation:   Pelvic Exam: Normal external female genitalia; Bartholin's and Skene's glands normal in appearance; urethral meatus normal in appearance, no urethral masses or discharge. The pessary was noted to be in place. It was removed and cleaned. Speculum exam revealed no lesions in the vagina. The pessary was replaced. It was comfortable to the patient and fit well.   POP-Q (05/05/21):    POP-Q   0                                            Aa   0                                           Ba   -7                                              C    2                                            Gh   5  Pb   7.5                                            tvl    -2                                            Ap   -2                                            Bp                                                  D           Assessment/Plan:    Assessment: Lindsey [name] is a 65 y.o. with stage II pelvic organ prolapse here for a pessary check. She is doing well.  Plan: - Continue #0 gehrung with a knob pessary.  - She will remove weekly .   Follow up 6 months or sooner if needed  Time spent: I spent 15 minutes dedicated to the care of this patient on the date of this encounter to include pre-visit review of records, face-to-face time with the patient and post visit documentation.

## 2021-07-27 ENCOUNTER — Other Ambulatory Visit: Payer: Self-pay | Admitting: Family Medicine

## 2021-08-05 ENCOUNTER — Other Ambulatory Visit: Payer: Self-pay

## 2021-08-05 ENCOUNTER — Other Ambulatory Visit: Payer: BC Managed Care – PPO

## 2021-08-05 DIAGNOSIS — E1121 Type 2 diabetes mellitus with diabetic nephropathy: Secondary | ICD-10-CM

## 2021-08-05 DIAGNOSIS — E782 Mixed hyperlipidemia: Secondary | ICD-10-CM | POA: Diagnosis not present

## 2021-08-05 DIAGNOSIS — I10 Essential (primary) hypertension: Secondary | ICD-10-CM

## 2021-08-06 LAB — LIPID PANEL
Chol/HDL Ratio: 2.2 ratio (ref 0.0–4.4)
Cholesterol, Total: 136 mg/dL (ref 100–199)
HDL: 63 mg/dL (ref >39)
LDL Chol Calc (NIH): 55 mg/dL (ref 0–99)
Triglycerides: 94 mg/dL (ref 0–149)
VLDL Cholesterol Cal: 18 mg/dL (ref 5–40)

## 2021-08-06 LAB — CBC
Hematocrit: 42.3 % (ref 34.0–46.6)
Hemoglobin: 14.1 g/dL (ref 11.1–15.9)
MCH: 27.6 pg (ref 26.6–33.0)
MCHC: 33.3 g/dL (ref 31.5–35.7)
MCV: 83 fL (ref 79–97)
Platelets: 417 10*3/uL (ref 150–450)
RBC: 5.11 x10E6/uL (ref 3.77–5.28)
RDW: 14.1 % (ref 11.7–15.4)
WBC: 5.6 10*3/uL (ref 3.4–10.8)

## 2021-08-06 LAB — COMPREHENSIVE METABOLIC PANEL
ALT: 11 IU/L (ref 0–32)
AST: 18 IU/L (ref 0–40)
Albumin/Globulin Ratio: 1.9 (ref 1.2–2.2)
Albumin: 4.3 g/dL (ref 3.8–4.8)
Alkaline Phosphatase: 71 IU/L (ref 44–121)
BUN/Creatinine Ratio: 18 (ref 12–28)
BUN: 15 mg/dL (ref 8–27)
Bilirubin Total: 0.6 mg/dL (ref 0.0–1.2)
CO2: 23 mmol/L (ref 20–29)
Calcium: 10.2 mg/dL (ref 8.7–10.3)
Chloride: 100 mmol/L (ref 96–106)
Creatinine, Ser: 0.85 mg/dL (ref 0.57–1.00)
Globulin, Total: 2.3 g/dL (ref 1.5–4.5)
Glucose: 88 mg/dL (ref 70–99)
Potassium: 5.1 mmol/L (ref 3.5–5.2)
Sodium: 137 mmol/L (ref 134–144)
Total Protein: 6.6 g/dL (ref 6.0–8.5)
eGFR: 76 mL/min/{1.73_m2} (ref >59)

## 2021-08-06 LAB — CARDIOVASCULAR RISK ASSESSMENT

## 2021-08-06 LAB — HEMOGLOBIN A1C
Est. average glucose Bld gHb Est-mCnc: 120 mg/dL
Hgb A1c MFr Bld: 5.8 % — ABNORMAL HIGH (ref 4.8–5.6)

## 2021-08-07 ENCOUNTER — Encounter: Payer: Self-pay | Admitting: Family Medicine

## 2021-08-07 NOTE — Progress Notes (Signed)
Subjective:  Patient ID: Lindsey [name], female    DOB: [birth year]  Age: 65 y.o. MRN: [id number]  Chief Complaint  Patient presents with   Annual Exam    HPI Well Adult Physical: Patient here for a comprehensive physical exam.The patient reports no problems Do you take any herbs or supplements that were not prescribed by a doctor? yes Are you taking calcium supplements? yes Are you taking aspirin daily? yes  Encounter for general adult medical examination without abnormal findings  Physical ("At Risk" items are starred): Patient's last physical exam was 1 year ago .  Patient is not afflicted from Stress Incontinence and Urge Incontinence  Patient wears a seat belt, has smoke detectors, has carbon monoxide detectors, practices appropriate gun safety, and wears sunscreen with extended sun exposure. Dental Care: biannual cleanings, brushes and flosses daily. Ophthalmology/Optometry: Annual visit.  Hearing loss: none Vision impairments: glasses  Pt came ahead for labs. Pt is a well controlled diabetic. She is tolerating her medicines. She is eating healthy. Not exercising.    [place] Office Visit from 08/08/2021 in [place]  PHQ-2 Total Score 0         Social Hx   Social History   Socioeconomic History   Marital status: Married    Spouse name: Not on file   Number of children: Not on file   Years of education: Not on file   Highest education level: Not on file  Occupational History   Not on file  Tobacco Use   Smoking status: Never   Smokeless tobacco: Never  Vaping Use   Vaping Use: Never used  Substance and Sexual Activity   Alcohol use: No   Drug use: No   Sexual activity: Yes    Birth control/protection: Surgical  Other Topics Concern   Not on file  Social History Narrative   Not on file   Social Determinants of Health   Financial Resource Strain: Not on file  Food Insecurity: Not on file  Transportation Needs: Not on file  Physical  Activity: Inactive   Days of Exercise per Week: 0 days   Minutes of Exercise per Session: 0 min  Stress: No Stress Concern Present   Feeling of Stress : Not at all  Social Connections: Moderately Integrated   Frequency of Communication with Friends and Family: More than three times a week   Frequency of Social Gatherings with Friends and Family: Once a week   Attends Religious Services: More than 4 times per year   Active Member of [employer] or Organizations: No   Attends Archivist Meetings: Never   Marital Status: Married   Past Medical History:  Diagnosis Date   Arthritis    Cancer ([place]) 2011   granulosis ovary   Diabetes mellitus without complication ([place])    Frequent UTI     h/o since hysterectomy/04/01/13 asymptomatic   GERD (gastroesophageal reflux disease)    Headache(784.0)    migraines   Hypertension    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 Overview:  The patient in 09/2009 underwent TAHBSO with the findings of a ruptured 10 cm right ovarian mass. 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   Past Surgical History:  Procedure Laterality Date   ABDOMINAL HYSTERECTOMY  2014   Stromal ovarian cancer   CHOLECYSTECTOMY     ENTEROCELE REPAIR     KNEE ARTHROSCOPY Right  KNEE ARTHROSCOPY Right 07/01/2014   Procedure: RIGHT ARTHROSCOPY KNEE WITH SYNOVECTOMY;  Surgeon: [name], MD;  Location: [facility];  Service: Orthopedics;  Laterality: Right;   RECTOCELE REPAIR     TONSILLECTOMY     TOTAL KNEE ARTHROPLASTY Right 04/07/2013   Procedure: RIGHT TOTAL KNEE ARTHROPLASTY;  Surgeon: [name], MD;  Location: [facility];  Service: Orthopedics;  Laterality: Right;    Family History  Problem Relation Age of Onset   Hyperlipidemia Mother    Hypertension Mother    Transient ischemic attack Mother    Hyperlipidemia Father    Hypertension Father    Lung cancer Father     Review of  Systems  Constitutional:  Negative for appetite change, fatigue and fever.  HENT:  Negative for congestion, ear pain, sinus pressure and sore throat.   Eyes:  Negative for pain.  Respiratory:  Negative for cough, chest tightness, shortness of breath and wheezing.   Cardiovascular:  Negative for chest pain and palpitations.  Gastrointestinal:  Negative for abdominal pain, constipation, diarrhea, nausea and vomiting.  Genitourinary:  Negative for dysuria and hematuria.  Musculoskeletal:  Negative for arthralgias, back pain, joint swelling and myalgias.  Skin:  Negative for rash.  Neurological:  Positive for headaches. Negative for dizziness and weakness.  Psychiatric/Behavioral:  Negative for dysphoric mood. The patient is not nervous/anxious.     Objective:  BP 122/90   Pulse 72   Temp (!) 97.3 F (36.3 C)   Resp 16   Ht 5\' 4"  (1.626 m)   Wt 150 lb (68 kg)   BMI 25.75 kg/m   BP/Weight 08/08/2021 07/20/2021 70/35/0093  Systolic BP 818 299 371  Diastolic BP 90 94 78  Wt. (Lbs) 150 - 151  BMI 25.75 - 25.92    Physical Exam Vitals reviewed.  Constitutional:      Appearance: Normal appearance. She is normal weight.  HENT:     Right Ear: Tympanic membrane, ear canal and external ear normal.     Left Ear: Tympanic membrane, ear canal and external ear normal.     Nose: Nose normal.     Mouth/Throat:     Pharynx: Oropharynx is clear.  Neck:     Vascular: No carotid bruit.  Cardiovascular:     Rate and Rhythm: Normal rate and regular rhythm.     Pulses: Normal pulses.     Heart sounds: Normal heart sounds. No murmur heard. Pulmonary:     Effort: Pulmonary effort is normal. No respiratory distress.     Breath sounds: Normal breath sounds.  Abdominal:     General: Abdomen is flat. Bowel sounds are normal.     Palpations: Abdomen is soft.     Tenderness: There is no abdominal tenderness.  Neurological:     Mental Status: She is alert and oriented to person, place, and time.   Psychiatric:        Mood and Affect: Mood normal.        Behavior: Behavior normal.    Lab Results  Component Value Date   WBC 5.6 08/05/2021   HGB 14.1 08/05/2021   HCT 42.3 08/05/2021   PLT 417 08/05/2021   GLUCOSE 88 08/05/2021   CHOL 136 08/05/2021   TRIG 94 08/05/2021   HDL 63 08/05/2021   LDLCALC 55 08/05/2021   ALT 11 08/05/2021   AST 18 08/05/2021   NA 137 08/05/2021   K 5.1 08/05/2021   CL 100 08/05/2021   CREATININE 0.85  08/05/2021   BUN 15 08/05/2021   CO2 23 08/05/2021   INR 0.92 04/01/2013   HGBA1C 5.8 (H) 08/05/2021   MICROALBUR 10 06/29/2021      Assessment & Plan:   Problem List Items Addressed This Visit       Cardiovascular and Mediastinum   Hypertension associated with diabetes ([place])    Well controlled.  No changes to medicines.  Continue to work on eating a healthy diet and exercise.  Labs reviewed today      Relevant Orders   CBC with Differential/Platelet   Comprehensive metabolic panel     Endocrine   Diabetic glomerulopathy ([place])    Control: good. Continue to work on eating a healthy diet and exercise.  Labs reviewed.      Relevant Orders   Hemoglobin A1c     Other   Mixed hyperlipidemia    Well controlled.  No changes to medicines.  Continue to work on eating a healthy diet and exercise.  Labs reviewed      Relevant Orders   Lipid panel   Encounter for general adult medical examination without abnormal findings - Primary    Things to do to keep yourself healthy  - Exercise at least 30-45 minutes a day, 3-4 days a week.  - Eat a low-fat diet with lots of fruits and vegetables, up to 7-9 servings per day.  - Seatbelts can save your life. Wear them always.  - Smoke detectors on every level of your home, check batteries every year.  - Eye Doctor - have an eye exam every 1-2 years  - Safe sex - if you may be exposed to STDs, use a condom.  - Alcohol -  If you drink, do it moderately, less than 2 drinks per day.  -  [place]. Choose someone to speak for you if you are not able.  - Depression is common in our stressful world.If you're feeling down or losing interest in things you normally enjoy, please come in for a visit.  - Violence - If anyone is threatening or hurting you, please call immediately.           Body mass index is 25.75 kg/m.   These are the goals we discussed:  Goals   None      This is a list of the screening recommended for you and due dates:  Health Maintenance  Topic Date Due   HIV Screening  Never done   Hepatitis C Screening: USPSTF Recommendation to screen - Ages 58-79 yo.  Never done   Pneumococcal Vaccination (2 - PCV) 11/08/2012   COVID-19 Vaccine (4 - Booster for Moderna series) 09/24/2020   Pap Smear  07/15/2021   Cologuard (Stool DNA test)  09/10/2021   Hemoglobin A1C  02/03/2022   Eye exam for diabetics  03/10/2022   Complete foot exam   05/10/2022   Mammogram  01/21/2023   Tetanus Vaccine  05/11/2028   Flu Shot  Completed   Zoster (Shingles) Vaccine  Completed   HPV Vaccine  Aged Out     No orders of the defined types were placed in this encounter.    Follow-up: Return in about 4 months (around 12/07/2021) for chronic follow up.  An After Visit Summary was printed and given to the patient.   I,Lauren M Auman,acting as a scribe for Rochel Brome, MD.,have documented all relevant documentation on the behalf of Rochel Brome, MD,as directed by  Rochel Brome, MD while  in the presence of Rochel Brome, MD.    Rochel Brome, MD [place] [phone]

## 2021-08-07 NOTE — Progress Notes (Signed)
Blood count normal.  Liver function normal.  Kidney function normal.  Cholesterol: good HBA1C: improved

## 2021-08-08 ENCOUNTER — Ambulatory Visit (INDEPENDENT_AMBULATORY_CARE_PROVIDER_SITE_OTHER): Payer: BC Managed Care – PPO | Admitting: Family Medicine

## 2021-08-08 ENCOUNTER — Other Ambulatory Visit: Payer: Self-pay

## 2021-08-08 ENCOUNTER — Encounter: Payer: Self-pay | Admitting: Family Medicine

## 2021-08-08 VITALS — BP 122/90 | HR 72 | Temp 97.3°F | Resp 16 | Ht 64.0 in | Wt 150.0 lb

## 2021-08-08 DIAGNOSIS — E782 Mixed hyperlipidemia: Secondary | ICD-10-CM | POA: Diagnosis not present

## 2021-08-08 DIAGNOSIS — Z0000 Encounter for general adult medical examination without abnormal findings: Secondary | ICD-10-CM | POA: Insufficient documentation

## 2021-08-08 DIAGNOSIS — I152 Hypertension secondary to endocrine disorders: Secondary | ICD-10-CM | POA: Diagnosis not present

## 2021-08-08 DIAGNOSIS — E1159 Type 2 diabetes mellitus with other circulatory complications: Secondary | ICD-10-CM

## 2021-08-08 DIAGNOSIS — E1121 Type 2 diabetes mellitus with diabetic nephropathy: Secondary | ICD-10-CM

## 2021-08-08 NOTE — Patient Instructions (Signed)

## 2021-08-15 ENCOUNTER — Encounter: Payer: Self-pay | Admitting: Family Medicine

## 2021-08-15 ENCOUNTER — Other Ambulatory Visit: Payer: Self-pay | Admitting: Family Medicine

## 2021-08-16 ENCOUNTER — Other Ambulatory Visit: Payer: Self-pay | Admitting: Family Medicine

## 2021-08-16 DIAGNOSIS — E1121 Type 2 diabetes mellitus with diabetic nephropathy: Secondary | ICD-10-CM

## 2021-08-16 MED ORDER — LISINOPRIL 40 MG PO TABS: 40.0000 mg | ORAL_TABLET | Freq: Two times a day (BID) | ORAL | 0 refills | Status: DC

## 2021-08-20 NOTE — Assessment & Plan Note (Signed)
Control: good. Continue to work on eating a healthy diet and exercise.  Labs reviewed.

## 2021-08-20 NOTE — Assessment & Plan Note (Signed)
Well controlled.  °No changes to medicines.  °Continue to work on eating a healthy diet and exercise.  °Labs reviewed.  °

## 2021-08-20 NOTE — Assessment & Plan Note (Signed)
Well controlled.  No changes to medicines.  Continue to work on eating a healthy diet and exercise.  Labs reviewed today 

## 2021-08-20 NOTE — Assessment & Plan Note (Signed)

## 2021-08-28 ENCOUNTER — Other Ambulatory Visit: Payer: Self-pay | Admitting: Family Medicine

## 2021-09-04 DIAGNOSIS — N8110 Cystocele, unspecified: Secondary | ICD-10-CM

## 2021-09-04 HISTORY — DX: Cystocele, unspecified: N81.10

## 2021-09-20 DIAGNOSIS — N3001 Acute cystitis with hematuria: Secondary | ICD-10-CM | POA: Diagnosis not present

## 2021-09-20 DIAGNOSIS — N3091 Cystitis, unspecified with hematuria: Secondary | ICD-10-CM | POA: Diagnosis not present

## 2021-09-20 DIAGNOSIS — J019 Acute sinusitis, unspecified: Secondary | ICD-10-CM | POA: Diagnosis not present

## 2021-10-03 ENCOUNTER — Other Ambulatory Visit: Payer: Self-pay | Admitting: Family Medicine

## 2021-10-05 ENCOUNTER — Other Ambulatory Visit: Payer: Self-pay | Admitting: Family Medicine

## 2021-10-06 NOTE — Telephone Encounter (Signed)
Refill sent to pharmacy.   

## 2021-11-10 ENCOUNTER — Other Ambulatory Visit: Payer: Self-pay | Admitting: Family Medicine

## 2021-11-14 ENCOUNTER — Other Ambulatory Visit: Payer: Self-pay | Admitting: Family Medicine

## 2021-11-15 NOTE — Telephone Encounter (Signed)
Refill sent to pharmacy.   

## 2021-11-22 ENCOUNTER — Other Ambulatory Visit: Payer: Self-pay | Admitting: Family Medicine

## 2021-11-22 NOTE — Telephone Encounter (Signed)
Refill sent to pharmacy.   

## 2021-12-09 ENCOUNTER — Ambulatory Visit: Payer: BC Managed Care – PPO | Admitting: Family Medicine

## 2021-12-14 ENCOUNTER — Ambulatory Visit: Payer: BC Managed Care – PPO | Admitting: Family Medicine

## 2021-12-15 ENCOUNTER — Other Ambulatory Visit: Payer: BC Managed Care – PPO

## 2021-12-15 DIAGNOSIS — E1159 Type 2 diabetes mellitus with other circulatory complications: Secondary | ICD-10-CM

## 2021-12-15 DIAGNOSIS — E782 Mixed hyperlipidemia: Secondary | ICD-10-CM | POA: Diagnosis not present

## 2021-12-15 DIAGNOSIS — E1121 Type 2 diabetes mellitus with diabetic nephropathy: Secondary | ICD-10-CM

## 2021-12-15 DIAGNOSIS — I152 Hypertension secondary to endocrine disorders: Secondary | ICD-10-CM | POA: Diagnosis not present

## 2021-12-16 LAB — LIPID PANEL
Chol/HDL Ratio: 2.3 ratio (ref 0.0–4.4)
Cholesterol, Total: 149 mg/dL (ref 100–199)
HDL: 64 mg/dL (ref >39)
LDL Chol Calc (NIH): 61 mg/dL (ref 0–99)
Triglycerides: 144 mg/dL (ref 0–149)
VLDL Cholesterol Cal: 24 mg/dL (ref 5–40)

## 2021-12-16 LAB — CBC WITH DIFFERENTIAL/PLATELET
Basophils Absolute: 0 10*3/uL (ref 0.0–0.2)
Basos: 1 %
EOS (ABSOLUTE): 0.1 10*3/uL (ref 0.0–0.4)
Eos: 1 %
Hematocrit: 44.6 % (ref 34.0–46.6)
Hemoglobin: 14.7 g/dL (ref 11.1–15.9)
Immature Grans (Abs): 0 10*3/uL (ref 0.0–0.1)
Immature Granulocytes: 0 %
Lymphocytes Absolute: 1.9 10*3/uL (ref 0.7–3.1)
Lymphs: 30 %
MCH: 27.7 pg (ref 26.6–33.0)
MCHC: 33 g/dL (ref 31.5–35.7)
MCV: 84 fL (ref 79–97)
Monocytes Absolute: 0.5 10*3/uL (ref 0.1–0.9)
Monocytes: 8 %
Neutrophils Absolute: 3.7 10*3/uL (ref 1.4–7.0)
Neutrophils: 60 %
Platelets: 433 10*3/uL (ref 150–450)
RBC: 5.3 x10E6/uL — ABNORMAL HIGH (ref 3.77–5.28)
RDW: 13.8 % (ref 11.7–15.4)
WBC: 6.3 10*3/uL (ref 3.4–10.8)

## 2021-12-16 LAB — COMPREHENSIVE METABOLIC PANEL
ALT: 14 IU/L (ref 0–32)
AST: 18 IU/L (ref 0–40)
Albumin/Globulin Ratio: 1.8 (ref 1.2–2.2)
Albumin: 4.6 g/dL (ref 3.8–4.8)
Alkaline Phosphatase: 73 IU/L (ref 44–121)
BUN/Creatinine Ratio: 22 (ref 12–28)
BUN: 20 mg/dL (ref 8–27)
Bilirubin Total: 0.6 mg/dL (ref 0.0–1.2)
CO2: 24 mmol/L (ref 20–29)
Calcium: 10.6 mg/dL — ABNORMAL HIGH (ref 8.7–10.3)
Chloride: 97 mmol/L (ref 96–106)
Creatinine, Ser: 0.89 mg/dL (ref 0.57–1.00)
Globulin, Total: 2.5 g/dL (ref 1.5–4.5)
Glucose: 90 mg/dL (ref 70–99)
Potassium: 4.2 mmol/L (ref 3.5–5.2)
Sodium: 138 mmol/L (ref 134–144)
Total Protein: 7.1 g/dL (ref 6.0–8.5)
eGFR: 72 mL/min/{1.73_m2} (ref >59)

## 2021-12-16 LAB — HEMOGLOBIN A1C
Est. average glucose Bld gHb Est-mCnc: 126 mg/dL
Hgb A1c MFr Bld: 6 % — ABNORMAL HIGH (ref 4.8–5.6)

## 2021-12-16 LAB — CARDIOVASCULAR RISK ASSESSMENT

## 2021-12-19 ENCOUNTER — Ambulatory Visit: Payer: BC Managed Care – PPO | Admitting: Family Medicine

## 2021-12-19 ENCOUNTER — Encounter: Payer: Self-pay | Admitting: Family Medicine

## 2021-12-19 VITALS — BP 100/78 | HR 81 | Temp 98.5°F | Ht 64.0 in | Wt 149.0 lb

## 2021-12-19 DIAGNOSIS — E1159 Type 2 diabetes mellitus with other circulatory complications: Secondary | ICD-10-CM

## 2021-12-19 DIAGNOSIS — I152 Hypertension secondary to endocrine disorders: Secondary | ICD-10-CM

## 2021-12-19 DIAGNOSIS — F5101 Primary insomnia: Secondary | ICD-10-CM

## 2021-12-19 DIAGNOSIS — Z6825 Body mass index (BMI) 25.0-25.9, adult: Secondary | ICD-10-CM

## 2021-12-19 DIAGNOSIS — E1169 Type 2 diabetes mellitus with other specified complication: Secondary | ICD-10-CM

## 2021-12-19 DIAGNOSIS — E782 Mixed hyperlipidemia: Secondary | ICD-10-CM

## 2021-12-19 DIAGNOSIS — E1121 Type 2 diabetes mellitus with diabetic nephropathy: Secondary | ICD-10-CM

## 2021-12-19 DIAGNOSIS — E785 Hyperlipidemia, unspecified: Secondary | ICD-10-CM

## 2021-12-19 DIAGNOSIS — G43009 Migraine without aura, not intractable, without status migrainosus: Secondary | ICD-10-CM

## 2021-12-19 MED ORDER — MICROLET NEXT LANCING DEVICE MISC: 1.0000 | Freq: Every morning | 0 refills | Status: DC

## 2021-12-19 MED ORDER — ZOLPIDEM TARTRATE ER 12.5 MG PO TBCR: 12.5000 mg | EXTENDED_RELEASE_TABLET | Freq: Every day | ORAL | 1 refills | Status: DC

## 2021-12-19 MED ORDER — CONTOUR NEXT TEST VI STRP: ORAL_STRIP | 3 refills | Status: DC

## 2021-12-19 NOTE — Progress Notes (Signed)
? ?Subjective:  ?Patient ID: Lindsey [name], female    DOB: 10-13-1955  Age: 66 y.o. MRN: [id number] ? ?Chief Complaint  ?Patient presents with  ? Diabetes  ? Hyperlipidemia  ? Hypertension  ? ?HPI: ?Diabetes:  ?Complications: glomerulopathy ?Glucose checking: daily. ?Glucose logs:120-125 ?Hypoglycemia: no  ?Most recent A1C: 6.0 ?Current medications: Synjardy XR 12.01/999 mg 2 daily ?Ozempic 0.5 mg weekly and aspirin 81 mg daily. ?Last Eye Exam: Up-to-date ?Foot checks: Daily ? ?Hyperlipidemia: ?Current medications: pravachol 10 mg before bed.  Lovaza 1 g 2 capsules twice daily. ? ?Migraines: Using Imitrex sporadically. ? ?Hypertension: ?Complications: ?Current medications: Lisinopril 40 mg twice daily ? ?Insomnia: Well-controlled with zolpidem 12.5 mg CR 1 p.o. nightly. ? ?Diet: Healthy. ?  ?Current Outpatient Medications on File Prior to Visit  ?Medication Sig Dispense Refill  ? aspirin EC 81 MG tablet Take 81 mg by mouth every morning.    ? Calcium Carb-Cholecalciferol (CALCIUM 600 + D PO) Take 1 tablet by mouth at bedtime.    ? lisinopril (ZESTRIL) 40 MG tablet Take 1 tablet (40 mg total) by mouth in the morning and at bedtime. 180 tablet 0  ? LORazepam (ATIVAN) 0.5 MG tablet TAKE 1 TABLET BY MOUTH ONCE DAILY AS NEEDED FOR ANXIETY. 30 tablet 1  ? omega-3 acid ethyl esters (LOVAZA) 1 g capsule TAKE 2 CAPSULES BY MOUTH  TWICE DAILY 360 capsule 3  ? omeprazole (PRILOSEC) 20 MG capsule TAKE 1 CAPSULE BY MOUTH  ONCE DAILY BEFORE A MEAL 90 capsule 3  ? OZEMPIC, 0.25 OR 0.5 MG/DOSE, 2 MG/1.5ML SOPN INJECT 0.5 MG DOSE INTO THE SKIN ONCE A WEEK. 6 mL 1  ? pravastatin (PRAVACHOL) 10 MG tablet TAKE 1 TABLET BY MOUTH ONCE DAILY 90 tablet 3  ? SUMAtriptan (IMITREX) 100 MG tablet TAKE 1 TABLET BY MOUTH AT  ONSET OF MIGRAINE MAY  REPEAT IN 2 HOURS AS NEEDED MAX OF 2 TABLETS BY MOUTH  DAILY 27 tablet 1  ? SYNJARDY XR 12.01-999 MG TB24 TAKE 2 TABLETS BY MOUTH DAILY 180 tablet 3  ? ?No current facility-administered medications on  file prior to visit.  ? ?Past Medical History:  ?Diagnosis Date  ? Arthritis   ? Cancer [facility]) 2011  ? granulosis ovary  ? Diabetes mellitus without complication ([place])   ? Frequent UTI   ?  h/o since hysterectomy/04/01/13 asymptomatic  ? GERD (gastroesophageal reflux disease)   ? Headache(784.0)   ? migraines  ? Hypertension   ? 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 Formatting of this note might be different from the original. Overview:  The patient in 09/2009 underwent TAHBSO with the findings of a ruptured 10 cm right ovarian mass. 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  ? ?Past Surgical History:  ?Procedure Laterality Date  ? ABDOMINAL HYSTERECTOMY  2014  ? Stromal ovarian cancer  ? CHOLECYSTECTOMY    ? ENTEROCELE REPAIR    ? KNEE ARTHROSCOPY Right   ? KNEE ARTHROSCOPY Right 07/01/2014  ? Procedure: RIGHT ARTHROSCOPY KNEE WITH SYNOVECTOMY;  Surgeon: [name], MD;  Location: [facility];  Service: Orthopedics;  Laterality: Right;  ? RECTOCELE REPAIR    ? TONSILLECTOMY    ? TOTAL KNEE ARTHROPLASTY Right 04/07/2013  ? Procedure: RIGHT TOTAL KNEE ARTHROPLASTY;  Surgeon: [name], MD;  Location: [facility];  Service: Orthopedics;  Laterality: Right;  ?  ?Family History  ?Problem Relation Age of Onset  ? Hyperlipidemia Mother   ? Hypertension  Mother   ? Transient ischemic attack Mother   ? Hyperlipidemia Father   ? Hypertension Father   ? Lung cancer Father   ? ?Social History  ? ?Socioeconomic History  ? Marital status: Married  ?  Spouse name: Not on file  ? Number of children: Not on file  ? Years of education: Not on file  ? Highest education level: Not on file  ?Occupational History  ? Not on file  ?Tobacco Use  ? Smoking status: Never  ? Smokeless tobacco: Never  ?Vaping Use  ? Vaping Use: Never used  ?Substance and Sexual Activity  ? Alcohol use: No  ? Drug use: No  ? Sexual activity: Yes  ?  Birth control/protection: Surgical  ?Other Topics Concern  ? Not on file  ?Social History  Narrative  ? Not on file  ? ?Social Determinants of Health  ? ?Financial Resource Strain: Not on file  ?Food Insecurity: Not on file  ?Transportation Needs: Not on file  ?Physical Activity: Inactive  ? Days of Exercise per Week: 0 days  ? Minutes of Exercise per Session: 0 min  ?Stress: No Stress Concern Present  ? Feeling of Stress : Not at all  ?Social Connections: Moderately Integrated  ? Frequency of Communication with Friends and Family: More than three times a week  ? Frequency of Social Gatherings with Friends and Family: Once a week  ? Attends Religious Services: More than 4 times per year  ? Active Member of Clubs or Organizations: No  ? Attends Archivist Meetings: Never  ? Marital Status: Married  ? ? ?Review of Systems  ?Constitutional:  Negative for appetite change, fatigue and fever.  ?HENT:  Negative for congestion, ear pain, sinus pressure and sore throat.   ?Respiratory:  Negative for cough, chest tightness, shortness of breath and wheezing.   ?Cardiovascular:  Negative for chest pain and palpitations.  ?Gastrointestinal:  Negative for abdominal pain, constipation, diarrhea, nausea and vomiting.  ?Genitourinary:  Negative for dysuria and hematuria.  ?Musculoskeletal:  Negative for arthralgias, back pain, joint swelling and myalgias.  ?Skin:  Negative for rash.  ?Neurological:  Negative for dizziness, weakness and headaches.  ?Psychiatric/Behavioral:  Negative for dysphoric mood. The patient is not nervous/anxious.   ? ? ?Objective:  ?BP 100/78 (BP Location: Left Arm, Patient Position: Sitting)   Pulse 81   Temp 98.5 ?F (36.9 ?C) (Oral)   Ht '5\' 4"'$  (1.626 m)   Wt 149 lb (67.6 kg)   SpO2 99%   BMI 25.58 kg/m?  ? ? ?  12/19/2021  ?  1:21 PM 08/08/2021  ? 11:04 AM 08/08/2021  ? 10:15 AM  ?BP/Weight  ?Systolic BP 701 779 390  ?Diastolic BP 78 90 88  ?Wt. (Lbs) 149  150  ?BMI 25.58 kg/m2  25.75 kg/m2  ? ? ?Physical Exam ?Vitals reviewed.  ?Constitutional:   ?   Appearance: Normal appearance.  She is normal weight.  ?Neck:  ?   Vascular: No carotid bruit.  ?Cardiovascular:  ?   Rate and Rhythm: Normal rate and regular rhythm.  ?   Heart sounds: Normal heart sounds.  ?Pulmonary:  ?   Effort: Pulmonary effort is normal. No respiratory distress.  ?   Breath sounds: Normal breath sounds.  ?Abdominal:  ?   General: Abdomen is flat. Bowel sounds are normal.  ?   Palpations: Abdomen is soft.  ?   Tenderness: There is no abdominal tenderness.  ?Neurological:  ?  Mental Status: She is alert and oriented to person, place, and time.  ?Psychiatric:     ?   Mood and Affect: Mood normal.     ?   Behavior: Behavior normal.  ? ? ?Diabetic Foot Exam - Simple   ?Simple Foot Form ?Diabetic Foot exam was performed with the following findings: Yes 12/19/2021 12:43 PM  ?Visual Inspection ?No deformities, no ulcerations, no other skin breakdown bilaterally: Yes ?Sensation Testing ?Intact to touch and monofilament testing bilaterally: Yes ?Pulse Check ?Posterior Tibialis and Dorsalis pulse intact bilaterally: Yes ?Comments ?  ?  ? ?Lab Results  ?Component Value Date  ? WBC 6.3 12/15/2021  ? HGB 14.7 12/15/2021  ? HCT 44.6 12/15/2021  ? PLT 433 12/15/2021  ? GLUCOSE 90 12/15/2021  ? CHOL 149 12/15/2021  ? TRIG 144 12/15/2021  ? HDL 64 12/15/2021  ? [place] 61 12/15/2021  ? ALT 14 12/15/2021  ? AST 18 12/15/2021  ? NA 138 12/15/2021  ? K 4.2 12/15/2021  ? CL 97 12/15/2021  ? CREATININE 0.89 12/15/2021  ? BUN 20 12/15/2021  ? CO2 24 12/15/2021  ? INR 0.92 04/01/2013  ? HGBA1C 6.0 (H) 12/15/2021  ? MICROALBUR 10 06/29/2021  ? ? ? ? ?Assessment & Plan:  ? ?Problem List Items Addressed This Visit   ? ?  ? Cardiovascular and Mediastinum  ? Hypertension associated with diabetes ([place])  ?  Well controlled.  ?No changes to medicines.  Lisinopril 40 mg twice daily. ?Continue to work on eating a healthy diet and exercise.  ?Labs reviewed ?  ?  ? Migraine without aura and without status migrainosus, not intractable  ?  Well-controlled. ? ?  ?   ?  ? Endocrine  ? RESOLVED: Dyslipidemia associated with type 2 diabetes mellitus ([place])  ? Diabetic glomerulopathy ([place]) - Primary  ?  Control: excellent ?Recommend check sugars fasting daily. ?Recommend chec

## 2022-01-01 DIAGNOSIS — Z6825 Body mass index (BMI) 25.0-25.9, adult: Secondary | ICD-10-CM | POA: Insufficient documentation

## 2022-01-01 NOTE — Assessment & Plan Note (Signed)
Well controlled 

## 2022-01-01 NOTE — Assessment & Plan Note (Signed)
Well-controlled on Ambien CR 12.5 mg nightly ?

## 2022-01-01 NOTE — Assessment & Plan Note (Signed)
Well controlled.  ?No changes to medicines. Continue pravachol 10 mg before bed.  Lovaza 1 g 2 capsules twice daily. ?Continue to work on eating a healthy diet and exercise.  ?Labs drawn today.  ? ?

## 2022-01-01 NOTE — Assessment & Plan Note (Addendum)
Well controlled.  ?No changes to medicines.  Lisinopril 40 mg twice daily. ?Continue to work on eating a healthy diet and exercise.  ?Labs reviewed ?

## 2022-01-01 NOTE — Assessment & Plan Note (Signed)
Control: excellent ?Recommend check sugars fasting daily. ?Recommend check feet daily. ?Recommend annual eye exams. ?Medicines: continue Synjardy XR 12.01/999 mg 2 daily ?Ozempic 0.5 mg weekly and aspirin 81 mg daily. ?Continue to work on eating a healthy diet and exercise.  ?Labs reviewed. ?

## 2022-01-06 ENCOUNTER — Other Ambulatory Visit: Payer: Self-pay | Admitting: Family Medicine

## 2022-01-09 NOTE — Telephone Encounter (Signed)
Refill sent to pharmacy.   

## 2022-01-10 ENCOUNTER — Other Ambulatory Visit: Payer: Self-pay | Admitting: Family Medicine

## 2022-01-17 NOTE — Progress Notes (Signed)
[facility]   Subjective:     Chief Complaint:  Chief Complaint  Patient presents with   [name] is a 66 y.o. female here for a pessary check. Pt said the pessary is uncomfortable and want to discuss surgery.   History of Present Illness: Lindsey [name] is a 66 y.o. female with stage II pelvic organ prolapse who presents for a pessary check. She is using a size #0 gehrung with a knob pessary. She has been removing it about once a week but overall the pessary is bothersome to her and she wants to consider surgery.   She does have urinary leakage with standing, but also has some difficulty emptying bladder. Feels urine stream is slow.   The last few days she has experienced some burning with urination. She is unsure if she has a urinary tract infection.   Past Medical History: Patient  has a past medical history of Arthritis, Cancer ([place]) (2011), Diabetes mellitus without complication ([place]), Frequent UTI, GERD (gastroesophageal reflux disease), Headache(784.0), Hypertension, and Sex cord tumor of ovary (03/04/2013).   Past Surgical History: She  has a past surgical history that includes Tonsillectomy; Rectocele repair; Enterocele repair; Cholecystectomy; Knee arthroscopy (Right); Total knee arthroplasty (Right, 04/07/2013); Knee arthroscopy (Right, 07/01/2014); and Abdominal hysterectomy (2014).   Medications: She has a current medication list which includes the following prescription(s): aspirin ec, calcium carb-cholecalciferol, contour next test, microlet next lancing device, lisinopril, lorazepam, omega-3 acid ethyl esters, omeprazole, ozempic (0.25 or 0.5 mg/dose), pravastatin, sumatriptan, synjardy xr, and zolpidem.   Allergies: Patient is allergic to amoxicillin, trulicity [dulaglutide], citalopram, erythromycin, hydrocodone, sertraline, sulfa antibiotics, tramadol, trazodone and nefazodone, and venlafaxine.   Social History: Patient  reports  that she has never smoked. She has never used smokeless tobacco. She reports that she does not drink alcohol and does not use drugs.      Objective:    Physical Exam: BP 133/84   Pulse 60  Gen: No apparent distress, A&O x 3. Detailed Urogynecologic Evaluation:  Pelvic Exam: Normal external female genitalia; Speculum exam revealed no lesions in the vagina.   POP-Q  0                                            Aa   0                                           Ba  -7                                              C   2.5                                            Gh  5  Pb  7.5                                            tvl   -2                                            Ap  -2                                            Bp                                                 D     POC urine: trace leukocytes, small blood.     Assessment/Plan:    Assessment: Lindsey [name] is a 66 y.o. with stage II pelvic organ prolapse and SUI  Plan: - Due to symptoms of leakage and incomplete emptying, will have her undergo urodynamic testing to further evaluate.  - Reexamined today and prolapse has not changed. We discussed procedure of anterior repair (with possible sacrospinous fixation).  - Will send urine today for culture and micro UA, but UA not definitive for infection.   She will follow up after urodynamics  [name], MD   Time spent: I spent 25 minutes dedicated to the care of this patient on the date of this encounter to include pre-visit review of records, face-to-face time with the patient and post visit documentation.

## 2022-01-18 ENCOUNTER — Ambulatory Visit: Payer: BC Managed Care – PPO | Admitting: Obstetrics and Gynecology

## 2022-01-19 ENCOUNTER — Other Ambulatory Visit (HOSPITAL_COMMUNITY)
Admission: RE | Admit: 2022-01-19 | Discharge: 2022-01-19 | Disposition: A | Discharge status: Home / Self Care | Payer: BC Managed Care – PPO | Attending: Obstetrics and Gynecology | Admitting: Obstetrics and Gynecology

## 2022-01-19 ENCOUNTER — Ambulatory Visit: Payer: BC Managed Care – PPO | Admitting: Obstetrics and Gynecology

## 2022-01-19 ENCOUNTER — Encounter: Payer: Self-pay | Admitting: Obstetrics and Gynecology

## 2022-01-19 VITALS — BP 133/84 | HR 60

## 2022-01-19 DIAGNOSIS — N393 Stress incontinence (female) (male): Secondary | ICD-10-CM

## 2022-01-19 DIAGNOSIS — R350 Frequency of micturition: Secondary | ICD-10-CM | POA: Insufficient documentation

## 2022-01-19 DIAGNOSIS — N8110 Cystocele, unspecified: Secondary | ICD-10-CM

## 2022-01-19 LAB — POCT URINALYSIS DIPSTICK
Appearance: NORMAL
Bilirubin, UA: NEGATIVE
Glucose, UA: POSITIVE — AB
Ketones, UA: NEGATIVE
Nitrite, UA: NEGATIVE
Protein, UA: NEGATIVE
Spec Grav, UA: 1.01 (ref 1.010–1.025)
Urobilinogen, UA: 0.2 E.U./dL
pH, UA: 6.5 (ref 5.0–8.0)

## 2022-01-19 NOTE — Patient Instructions (Signed)

## 2022-01-20 LAB — URINALYSIS, ROUTINE W REFLEX MICROSCOPIC
Bilirubin Urine: NEGATIVE
Glucose, UA: >=500 mg/dL — AB
Ketones, ur: NEGATIVE mg/dL
Nitrite: NEGATIVE
Protein, ur: NEGATIVE mg/dL
Specific Gravity, Urine: 1.011 (ref 1.005–1.030)
pH: 6 (ref 5.0–8.0)

## 2022-01-21 LAB — URINE CULTURE

## 2022-01-27 ENCOUNTER — Other Ambulatory Visit: Payer: Self-pay | Admitting: Family Medicine

## 2022-01-27 DIAGNOSIS — E1121 Type 2 diabetes mellitus with diabetic nephropathy: Secondary | ICD-10-CM

## 2022-02-01 ENCOUNTER — Ambulatory Visit (INDEPENDENT_AMBULATORY_CARE_PROVIDER_SITE_OTHER): Payer: BC Managed Care – PPO | Admitting: Obstetrics and Gynecology

## 2022-02-01 VITALS — BP 126/79 | HR 64 | Ht 64.0 in | Wt 149.0 lb

## 2022-02-01 DIAGNOSIS — R339 Retention of urine, unspecified: Secondary | ICD-10-CM

## 2022-02-01 DIAGNOSIS — R350 Frequency of micturition: Secondary | ICD-10-CM | POA: Diagnosis not present

## 2022-02-01 LAB — POCT URINALYSIS DIPSTICK
Appearance: NORMAL
Bilirubin, UA: NEGATIVE
Glucose, UA: POSITIVE — AB
Ketones, UA: NEGATIVE
Leukocytes, UA: NEGATIVE
Nitrite, UA: NEGATIVE
Protein, UA: NEGATIVE
Spec Grav, UA: 1.01 (ref 1.010–1.025)
Urobilinogen, UA: 0.2 E.U./dL
pH, UA: 5.5 (ref 5.0–8.0)

## 2022-02-01 NOTE — Patient Instructions (Signed)

## 2022-02-06 NOTE — Progress Notes (Signed)
[facility] Urodynamics Procedure  Referring Physician: Rochel Brome, MD Date of Procedure: 02/01/2022  Lindsey [name] is a 66 y.o. female who presents for urodynamic evaluation. Indication(s) for study: SUI and sensation of incomplete bladder emptying  Vital Signs: BP 126/79   Pulse 64   Ht '5\' 4"'$  (1.626 m)   Wt 149 lb (67.6 kg)   BMI 25.58 kg/m   Laboratory Results: A catheterized urine specimen revealed:  POC urine: positive glucose   Voiding Diary: Not performed  Procedure Timeout:  The correct patient was verified and the correct procedure was verified. The patient was in the correct position and safety precautions were reviewed based on at the patient's history.  Urodynamic Procedure A 40F dual lumen urodynamics catheter was placed under sterile conditions into the patient's bladder. A 40F catheter was placed into the rectum in order to measure abdominal pressure. EMG patches were placed in the appropriate position.  All connections were confirmed and calibrations/adjusted made. Saline was instilled into the bladder through the dual lumen catheters.  Cough/valsalva pressures were measured periodically during filling.  Patient was allowed to void.  The bladder was then emptied of its residual.  UROFLOW: Unable to void with initial uroflow.   CMG: This was performed with sterile water in the sitting position at a fill rate of 20- 30 mL/min.    First sensation of fullness was 50 mLs,  First urge was 64 mLs,  Strong urge was 169 mLs and  Capacity was 646 mLs  Stress incontinence was not demonstrated Highest negative Barrier CLPP was 74 cmH20 at 503 ml. Highest negative Barrier VLPP was 81 cmH20 at 503 ml. Testing was performed in both sitting and standing positions.   Detrusor function was normal, with no phasic contractions seen.    Compliance:  normal. End fill detrusor pressure was -10cmH20.    UPP: MUCP with barrier reduction was 51 cm of water.     MICTURITION STUDY: Voiding was performed with reduction using scopettes in the sitting position.  Pdet at Qmax was 29.7 cm of water.  Qmax was 15.8 mL/sec.  It was a intermittent pattern.  She voided 386.7 mL and had a residual of 260 mL.  It was a volitional void, sustained detrusor contraction was present and abdominal straining was present  EMG: This was performed with patches.  She had voluntary contractions, recruitment with fill was not present and urethral sphincter was relaxed with void.  The details of the procedure with the study tracings have been scanned into EPIC.   Urodynamic Impression:  1. Sensation was increased; capacity was normal 2. Stress Incontinence was not demonstrated. 3. Detrusor Overactivity was not demonstrated. 4. Emptying was dysfunctional with a elevated PVR (264m), a sustained detrusor contraction present,  abdominal straining present, normal urethral sphincter activity on EMG.  Plan: - The patient will follow up  to discuss the findings and treatment options.

## 2022-02-13 ENCOUNTER — Telehealth: Payer: BC Managed Care – PPO | Admitting: Obstetrics and Gynecology

## 2022-02-13 DIAGNOSIS — R339 Retention of urine, unspecified: Secondary | ICD-10-CM | POA: Diagnosis not present

## 2022-02-13 DIAGNOSIS — N8110 Cystocele, unspecified: Secondary | ICD-10-CM

## 2022-02-13 DIAGNOSIS — N993 Prolapse of vaginal vault after hysterectomy: Secondary | ICD-10-CM

## 2022-02-13 NOTE — Progress Notes (Signed)
[facility] Return Visit- video   Patient consented to video visit. She was located in [place] at the time of the visit. Provider was located at the office in [place]. She was alone on the call.   SUBJECTIVE  History of Present Illness: SHERRICE CREEKMORE is a 66 y.o. female seen in follow-up after urodynamic testing.   Urodynamic Impression:  1. Sensation was increased; capacity was normal 2. Stress Incontinence was not demonstrated. 3. Detrusor Overactivity was not demonstrated. 4. Emptying was dysfunctional with a elevated PVR (228m), a sustained detrusor contraction present,  abdominal straining present, normal urethral sphincter activity on EMG.  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   Past Surgical History: She  has a past surgical history that includes Tonsillectomy; Rectocele repair; Enterocele repair; Cholecystectomy; Knee arthroscopy (Right); Total knee arthroplasty (Right, 04/07/2013); Knee arthroscopy (Right, 07/01/2014); and Abdominal hysterectomy (2014).   Medications: She has a current medication list which includes the following prescription(s): aspirin ec, calcium carb-cholecalciferol, contour next test, microlet next lancing device, lisinopril, lorazepam, omega-3 acid ethyl esters, omeprazole, ozempic (0.25 or 0.5 mg/dose), pravastatin, sumatriptan, synjardy xr, and zolpidem.   Allergies: Patient is allergic to amoxicillin, trulicity [dulaglutide], citalopram, erythromycin, hydrocodone, sertraline, sulfa antibiotics, tramadol, trazodone and nefazodone, and venlafaxine.   Social History: Patient  reports that she has never smoked. She has never used smokeless tobacco. She reports that she does not drink alcohol and does not use drugs.       OBJECTIVE     Physical Exam:  Gen: No apparent distress, A&O x 3.  Detailed Urogynecologic Evaluation:  Deferred. Prior exam showed: POP-Q   0                                            Aa   0                                           Ba   -7                                              C    2.5                                            Gh   5                                            Pb   7.5                                            tvl    -2  Ap   -2                                            Bp                                                  D       ASSESSMENT AND PLAN    Ms. Thorley is a 66 y.o. with:  1. Incomplete bladder emptying   2. Prolapse of anterior vaginal wall   3. Vaginal vault prolapse after hysterectomy    Plan for surgery: Exam under anesthesia, anterior repair, possible sacrospinous ligament fixation, cystoscopy.   - We reviewed the patient's specific anatomic and functional findings, with the assistance of diagrams, and together finalized the above procedure. The planned surgical procedures were discussed along with the surgical risks. Additional treatment options including expectant management, conservative management, medical management were discussed where appropriate.  We reviewed the benefits and risks of each treatment option.   General Surgical Risks: For all procedures, there are risks of bleeding, infection, damage to surrounding organs including but not limited to bowel, bladder, blood vessels, ureters and nerves, and need for further surgery if an injury were to occur. These risks are all low with minimally invasive surgery.    - For preop Visit:  She is required to have a visit within 30 days of her surgery.    - Medical clearance: not required  - Anticoagulant use: No - Medicaid Hysterectomy form: No - Accepts blood transfusion: Yes - Expected length of stay:  outpatient  Request sent for surgery scheduling.   [name], MD

## 2022-02-28 ENCOUNTER — Telehealth (INDEPENDENT_AMBULATORY_CARE_PROVIDER_SITE_OTHER): Payer: BC Managed Care – PPO | Admitting: Obstetrics and Gynecology

## 2022-02-28 DIAGNOSIS — N993 Prolapse of vaginal vault after hysterectomy: Secondary | ICD-10-CM | POA: Diagnosis not present

## 2022-02-28 DIAGNOSIS — Z01818 Encounter for other preprocedural examination: Secondary | ICD-10-CM | POA: Diagnosis not present

## 2022-02-28 DIAGNOSIS — N8110 Cystocele, unspecified: Secondary | ICD-10-CM | POA: Diagnosis not present

## 2022-02-28 MED ORDER — ACETAMINOPHEN 500 MG PO TABS: 500.0000 mg | ORAL_TABLET | Freq: Four times a day (QID) | ORAL | 0 refills | Status: DC | PRN

## 2022-02-28 MED ORDER — IBUPROFEN 600 MG PO TABS: 600.0000 mg | ORAL_TABLET | Freq: Four times a day (QID) | ORAL | 0 refills | Status: DC | PRN

## 2022-02-28 MED ORDER — OXYCODONE HCL 5 MG PO TABS: 5.0000 mg | ORAL_TABLET | ORAL | 0 refills | Status: DC | PRN

## 2022-02-28 MED ORDER — POLYETHYLENE GLYCOL 3350 17 GM/SCOOP PO POWD: 17.0000 g | Freq: Every day | ORAL | 0 refills | Status: DC

## 2022-03-01 NOTE — H&P (Signed)
[facility] Pre-Operative video visit  Patient location: [place] Provider location:  Patient consented to video visit and was the only person present on the call.   Subjective Chief Complaint: Lindsey [name] presents for a preoperative encounter.   History of Present Illness: Lindsey [name] is a 66 y.o. female who presents for preoperative visit.  She is scheduled to undergo Exam under anesthesia, anterior repair, possible sacrospinous ligament fixation, cystoscopy on 03/27/22.  Her symptoms include vaginal bulge, and she was was found to have Stage II anterior, Stage I posterior, Stage I apical prolapse.  Urodynamic Impression:  1. Sensation was increased; capacity was normal 2. Stress Incontinence was not demonstrated. 3. Detrusor Overactivity was not demonstrated. 4. Emptying was dysfunctional with a elevated PVR (242m), a sustained detrusor contraction present,  abdominal straining present, normal urethral sphincter activity on EMG.  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 Overview:  The patient in 09/2009 underwent TAHBSO with the findings of a ruptured 10 cm right ovarian mass. 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     Past Surgical History:  Procedure Laterality Date   ABDOMINAL HYSTERECTOMY  2014   Stromal ovarian cancer   CHOLECYSTECTOMY     ENTEROCELE REPAIR     KNEE ARTHROSCOPY Right    KNEE ARTHROSCOPY Right 07/01/2014   Procedure: RIGHT ARTHROSCOPY KNEE WITH SYNOVECTOMY;  Surgeon: FGearlean Alf MD;  Location: [facility];  Service: Orthopedics;  Laterality: Right;   RECTOCELE  REPAIR     TONSILLECTOMY     TOTAL KNEE ARTHROPLASTY Right 04/07/2013   Procedure: RIGHT TOTAL KNEE ARTHROPLASTY;  Surgeon: FGearlean Alf MD;  Location: [facility];  Service: Orthopedics;  Laterality: Right;    is allergic to amoxicillin, trulicity [dulaglutide], citalopram, erythromycin, hydrocodone, sertraline, sulfa antibiotics, tramadol, trazodone and nefazodone, and venlafaxine.   Family History  Problem Relation Age of Onset   Hyperlipidemia Mother    Hypertension Mother    Transient ischemic attack Mother    Hyperlipidemia Father    Hypertension Father    Lung cancer Father     Social History   Tobacco Use   Smoking status: Never   Smokeless tobacco: Never  Vaping Use   Vaping Use: Never used  Substance Use Topics   Alcohol use: No   Drug use: No     Review of Systems was negative for a full 10 system review except as noted in the History of Present Illness.  No current facility-administered medications for this encounter.  Current Outpatient Medications:    acetaminophen (TYLENOL) 500 MG tablet, Take 1 tablet (500 mg total) by mouth every 6 (six) hours as needed (pain)., Disp: 30 tablet, Rfl: 0   aspirin EC 81 MG tablet, Take 81 mg by mouth every morning., Disp: , Rfl:    Calcium Carb-Cholecalciferol (CALCIUM 600 + D PO), Take 1 tablet by mouth at bedtime., Disp: , Rfl:    glucose blood (CONTOUR NEXT TEST) test strip, Use as instructed, Disp: 100 each, Rfl: 3   ibuprofen (ADVIL) 600 MG tablet, Take 1 tablet (  600 mg total) by mouth every 6 (six) hours as needed., Disp: 30 tablet, Rfl: 0   Lancet Devices (MICROLET NEXT LANCING DEVICE) MISC, 1 each by Does not apply route in the morning., Disp: 1 each, Rfl: 0   lisinopril (ZESTRIL) 40 MG tablet, TAKE 1 TABLET BY MOUTH TWICE DAILY IN THE MORNING AND AT BEDTIME., Disp: 180 tablet, Rfl: 0   LORazepam (ATIVAN) 0.5 MG tablet, TAKE 1 TABLET BY MOUTH ONCE DAILY AS NEEDED FOR ANXIETY., Disp: 30 tablet, Rfl: 1   omega-3 acid  ethyl esters (LOVAZA) 1 g capsule, TAKE 2 CAPSULES BY MOUTH  TWICE DAILY, Disp: 360 capsule, Rfl: 3   omeprazole (PRILOSEC) 20 MG capsule, TAKE 1 CAPSULE BY MOUTH  ONCE DAILY BEFORE A MEAL, Disp: 90 capsule, Rfl: 3   oxyCODONE (OXY IR/ROXICODONE) 5 MG immediate release tablet, Take 1 tablet (5 mg total) by mouth every 4 (four) hours as needed for severe pain., Disp: 5 tablet, Rfl: 0   OZEMPIC, 0.25 OR 0.5 MG/DOSE, 2 MG/1.5ML SOPN, INJECT 0.5 MG DOSE INTO THE SKIN ONCE A WEEK., Disp: 4.5 mL, Rfl: 1   polyethylene glycol powder (GLYCOLAX/MIRALAX) 17 GM/SCOOP powder, Take 17 g by mouth daily. Drink 17g (1 scoop) dissolved in water per day., Disp: 255 g, Rfl: 0   pravastatin (PRAVACHOL) 10 MG tablet, TAKE 1 TABLET BY MOUTH ONCE DAILY, Disp: 90 tablet, Rfl: 3   SUMAtriptan (IMITREX) 100 MG tablet, TAKE 1 TABLET BY MOUTH AT  ONSET OF MIGRAINE MAY  REPEAT IN 2 HOURS AS NEEDED MAX OF 2 TABLETS BY MOUTH  DAILY, Disp: 27 tablet, Rfl: 1   SYNJARDY XR 12.01-999 MG TB24, TAKE 2 TABLETS BY MOUTH DAILY, Disp: 180 tablet, Rfl: 3   zolpidem (AMBIEN CR) 12.5 MG CR tablet, Take 1 tablet (12.5 mg total) by mouth at bedtime., Disp: 90 tablet, Rfl: 1   Objective General: no distress  Previous Pelvic Exam showed: POP-Q   0                                            Aa   0                                           Ba   -7                                              C    2.5                                            Gh   5                                            Pb   7.5  tvl    -2                                            Ap   -2                                            Bp                                                  D     Hgb A1c on 12/15/21- 6.0   Assessment/ Plan  The patient is a 66 y.o. year old with stage II POP scheduled to undergo Exam under anesthesia, anterior repair, possible sacrospinous ligament fixation, cystoscopy.   [name], MD

## 2022-03-15 ENCOUNTER — Encounter (HOSPITAL_BASED_OUTPATIENT_CLINIC_OR_DEPARTMENT_OTHER): Payer: Self-pay | Admitting: Obstetrics and Gynecology

## 2022-03-15 ENCOUNTER — Other Ambulatory Visit: Payer: Self-pay

## 2022-03-15 NOTE — Progress Notes (Signed)
Spoke w/ via phone for pre-op interview---Lindsey [name] needs dos----type & screen, ISTAT, EKG               [name] results------none COVID test -----patient states asymptomatic no test needed Arrive at -------0700 on Monday, 03/27/22 NPO after MN NO Solid Food.  Clear liquids from MN until---0600 Med rec completed Medications to take morning of surgery -----Omeprazole, Pravastatin, Lorazepam prn, Imitrex prn Diabetic medication -----Hold Synjardy and Ozempic morning of surgery. Patient instructed no nail polish to be worn day of surgery Patient instructed to bring photo id and insurance card day of surgery Patient aware to have Driver (ride ) / caregiver    for 24 hours after surgery - husband, Lindsey [name] Patient Special Instructions -----none Pre-Op special Istructions -----none Patient verbalized understanding of instructions that were given at this phone interview. Patient denies shortness of breath, chest pain, fever, cough at this phone interview.

## 2022-03-26 NOTE — Anesthesia Preprocedure Evaluation (Addendum)
Anesthesia Evaluation  Patient identified by MRN, date of birth, ID band Patient awake    Reviewed: Allergy & Precautions, NPO status , Patient's Chart, lab work & pertinent test results  Airway Mallampati: III  TM Distance: >3 FB Neck ROM: Full    Dental  (+) Teeth Intact, Dental Advisory Given   Pulmonary neg pulmonary ROS,    Pulmonary exam normal breath sounds clear to auscultation       Cardiovascular hypertension, Pt. on medications Normal cardiovascular exam Rhythm:Regular Rate:Normal     Neuro/Psych  Headaches, PSYCHIATRIC DISORDERS Anxiety    GI/Hepatic Neg liver ROS, GERD  Medicated and Controlled,  Endo/Other  diabetes, Well Controlled, Type 2, Oral Hypoglycemic AgentsFS 101 this AM  Renal/GU negative Renal ROS  Female GU complaint     Musculoskeletal negative musculoskeletal ROS (+)   Abdominal   Peds  Hematology negative hematology ROS (+) Hb 13.3    Anesthesia Other Findings   Reproductive/Obstetrics negative OB ROS                           Anesthesia Physical Anesthesia Plan  ASA: 2  Anesthesia Plan: General   Post-op Pain Management: Tylenol PO (pre-op)* and Toradol IV (intra-op)*   Induction: Intravenous  PONV Risk Score and Plan: 4 or greater and Ondansetron, Dexamethasone, Midazolam and Treatment may vary due to age or medical condition  Airway Management Planned: LMA  Additional Equipment: None  Intra-op Plan:   Post-operative Plan: Extubation in OR  Informed Consent: I have reviewed the patients History and Physical, chart, labs and discussed the procedure including the risks, benefits and alternatives for the proposed anesthesia with the patient or authorized representative who has indicated his/her understanding and acceptance.     Dental advisory given  Plan Discussed with: CRNA  Anesthesia Plan Comments:       Anesthesia Quick  Evaluation

## 2022-03-27 ENCOUNTER — Other Ambulatory Visit: Payer: Self-pay

## 2022-03-27 ENCOUNTER — Encounter (HOSPITAL_BASED_OUTPATIENT_CLINIC_OR_DEPARTMENT_OTHER): Payer: Self-pay | Admitting: Obstetrics and Gynecology

## 2022-03-27 ENCOUNTER — Ambulatory Visit (HOSPITAL_BASED_OUTPATIENT_CLINIC_OR_DEPARTMENT_OTHER): Payer: BC Managed Care – PPO | Admitting: Anesthesiology

## 2022-03-27 ENCOUNTER — Ambulatory Visit (HOSPITAL_BASED_OUTPATIENT_CLINIC_OR_DEPARTMENT_OTHER)
Admission: RE | Admit: 2022-03-27 | Discharge: 2022-03-27 | Disposition: A | Discharge status: Home / Self Care | Payer: BC Managed Care – PPO | Attending: Obstetrics and Gynecology | Admitting: Obstetrics and Gynecology

## 2022-03-27 ENCOUNTER — Telehealth: Payer: Self-pay | Admitting: Obstetrics and Gynecology

## 2022-03-27 ENCOUNTER — Encounter (HOSPITAL_BASED_OUTPATIENT_CLINIC_OR_DEPARTMENT_OTHER)
Admission: RE | Disposition: A | Discharge status: Home / Self Care | Payer: Self-pay | Source: Home / Self Care | Attending: Obstetrics and Gynecology

## 2022-03-27 DIAGNOSIS — I10 Essential (primary) hypertension: Secondary | ICD-10-CM | POA: Insufficient documentation

## 2022-03-27 DIAGNOSIS — F419 Anxiety disorder, unspecified: Secondary | ICD-10-CM | POA: Insufficient documentation

## 2022-03-27 DIAGNOSIS — N8111 Cystocele, midline: Secondary | ICD-10-CM | POA: Diagnosis not present

## 2022-03-27 DIAGNOSIS — K219 Gastro-esophageal reflux disease without esophagitis: Secondary | ICD-10-CM | POA: Insufficient documentation

## 2022-03-27 DIAGNOSIS — Z79899 Other long term (current) drug therapy: Secondary | ICD-10-CM | POA: Insufficient documentation

## 2022-03-27 DIAGNOSIS — Z01818 Encounter for other preprocedural examination: Secondary | ICD-10-CM

## 2022-03-27 DIAGNOSIS — Z7984 Long term (current) use of oral hypoglycemic drugs: Secondary | ICD-10-CM | POA: Diagnosis not present

## 2022-03-27 DIAGNOSIS — E119 Type 2 diabetes mellitus without complications: Secondary | ICD-10-CM | POA: Diagnosis not present

## 2022-03-27 DIAGNOSIS — Z90710 Acquired absence of both cervix and uterus: Secondary | ICD-10-CM | POA: Diagnosis not present

## 2022-03-27 DIAGNOSIS — Z8249 Family history of ischemic heart disease and other diseases of the circulatory system: Secondary | ICD-10-CM | POA: Diagnosis not present

## 2022-03-27 DIAGNOSIS — N993 Prolapse of vaginal vault after hysterectomy: Secondary | ICD-10-CM | POA: Diagnosis not present

## 2022-03-27 DIAGNOSIS — Z7985 Long-term (current) use of injectable non-insulin antidiabetic drugs: Secondary | ICD-10-CM | POA: Diagnosis not present

## 2022-03-27 HISTORY — DX: Hyperlipidemia, unspecified: E78.5

## 2022-03-27 HISTORY — PX: ANTERIOR AND POSTERIOR REPAIR WITH SACROSPINOUS FIXATION: SHX6536

## 2022-03-27 HISTORY — DX: Type 2 diabetes mellitus with other specified complication: E11.69

## 2022-03-27 HISTORY — PX: CYSTOSCOPY: SHX5120

## 2022-03-27 HISTORY — DX: Anxiety disorder, unspecified: F41.9

## 2022-03-27 LAB — POCT I-STAT, CHEM 8
BUN: 14 mg/dL (ref 8–23)
Calcium, Ion: 1.31 mmol/L (ref 1.15–1.40)
Chloride: 96 mmol/L — ABNORMAL LOW (ref 98–111)
Creatinine, Ser: 0.8 mg/dL (ref 0.44–1.00)
Glucose, Bld: 101 mg/dL — ABNORMAL HIGH (ref 70–99)
HCT: 39 % (ref 36.0–46.0)
Hemoglobin: 13.3 g/dL (ref 12.0–15.0)
Potassium: 3.7 mmol/L (ref 3.5–5.1)
Sodium: 135 mmol/L (ref 135–145)
TCO2: 26 mmol/L (ref 22–32)

## 2022-03-27 LAB — GLUCOSE, CAPILLARY: Glucose-Capillary: 89 mg/dL (ref 70–99)

## 2022-03-27 LAB — TYPE AND SCREEN
ABO/RH(D): O NEG
Antibody Screen: NEGATIVE

## 2022-03-27 SURGERY — ANTERIOR AND POSTERIOR REPAIR WITH SACROSPINOUS FIXATION
Anesthesia: General | Site: Vagina

## 2022-03-27 MED ORDER — OXYCODONE HCL 5 MG/5ML PO SOLN: 5.0000 mg | Freq: Once | ORAL | Status: DC | PRN

## 2022-03-27 MED ORDER — ONDANSETRON HCL 4 MG/2ML IJ SOLN
INTRAMUSCULAR | Status: AC
  Filled 2022-03-27: qty 2

## 2022-03-27 MED ORDER — ACETAMINOPHEN 500 MG PO TABS
ORAL_TABLET | ORAL | Status: AC
  Filled 2022-03-27: qty 2

## 2022-03-27 MED ORDER — GENTAMICIN SULFATE 40 MG/ML IJ SOLN
5.0000 mg/kg | INTRAVENOUS | Status: AC
  Administered 2022-03-27: 307.2 mg via INTRAVENOUS
  Filled 2022-03-27: qty 7.75

## 2022-03-27 MED ORDER — LIDOCAINE HCL (PF) 2 % IJ SOLN
INTRAMUSCULAR | Status: AC
  Filled 2022-03-27: qty 5

## 2022-03-27 MED ORDER — 0.9 % SODIUM CHLORIDE (POUR BTL) OPTIME
TOPICAL | Status: DC | PRN
  Administered 2022-03-27: 500 mL

## 2022-03-27 MED ORDER — PHENYLEPHRINE 80 MCG/ML (10ML) SYRINGE FOR IV PUSH (FOR BLOOD PRESSURE SUPPORT)
PREFILLED_SYRINGE | INTRAVENOUS | Status: AC
  Filled 2022-03-27: qty 10

## 2022-03-27 MED ORDER — PROPOFOL 10 MG/ML IV BOLUS
INTRAVENOUS | Status: DC | PRN
  Administered 2022-03-27: 150 mg via INTRAVENOUS
  Administered 2022-03-27: 50 mg via INTRAVENOUS

## 2022-03-27 MED ORDER — LACTATED RINGERS IV SOLN: INTRAVENOUS | Status: DC

## 2022-03-27 MED ORDER — FENTANYL CITRATE (PF) 100 MCG/2ML IJ SOLN
INTRAMUSCULAR | Status: DC | PRN
Start: 2022-03-27 — End: 2022-03-27
  Administered 2022-03-27: 100 ug via INTRAVENOUS

## 2022-03-27 MED ORDER — OXYCODONE HCL 5 MG PO TABS: 5.0000 mg | ORAL_TABLET | Freq: Once | ORAL | Status: DC | PRN

## 2022-03-27 MED ORDER — PROPOFOL 10 MG/ML IV BOLUS
INTRAVENOUS | Status: AC
  Filled 2022-03-27: qty 20

## 2022-03-27 MED ORDER — ONDANSETRON HCL 4 MG/2ML IJ SOLN
INTRAMUSCULAR | Status: DC | PRN
  Administered 2022-03-27: 4 mg via INTRAVENOUS

## 2022-03-27 MED ORDER — LIDOCAINE-EPINEPHRINE 1 %-1:100000 IJ SOLN
INTRAMUSCULAR | Status: DC | PRN
  Administered 2022-03-27: 10 mL

## 2022-03-27 MED ORDER — LIDOCAINE HCL (CARDIAC) PF 100 MG/5ML IV SOSY
PREFILLED_SYRINGE | INTRAVENOUS | Status: DC | PRN
  Administered 2022-03-27: 60 mg via INTRAVENOUS

## 2022-03-27 MED ORDER — PHENAZOPYRIDINE HCL 100 MG PO TABS
ORAL_TABLET | ORAL | Status: AC
  Filled 2022-03-27: qty 2

## 2022-03-27 MED ORDER — KETOROLAC TROMETHAMINE 30 MG/ML IJ SOLN
INTRAMUSCULAR | Status: DC | PRN
  Administered 2022-03-27: 30 mg via INTRAVENOUS

## 2022-03-27 MED ORDER — MIDAZOLAM HCL 5 MG/5ML IJ SOLN
INTRAMUSCULAR | Status: DC | PRN
  Administered 2022-03-27: 2 mg via INTRAVENOUS

## 2022-03-27 MED ORDER — PHENYLEPHRINE HCL (PRESSORS) 10 MG/ML IV SOLN
INTRAVENOUS | Status: DC | PRN
  Administered 2022-03-27: 80 ug via INTRAVENOUS
  Administered 2022-03-27: 160 ug via INTRAVENOUS
  Administered 2022-03-27 (×2): 80 ug via INTRAVENOUS

## 2022-03-27 MED ORDER — ACETAMINOPHEN 500 MG PO TABS
1000.0000 mg | ORAL_TABLET | ORAL | Status: AC
  Administered 2022-03-27: 1000 mg via ORAL

## 2022-03-27 MED ORDER — DEXAMETHASONE SODIUM PHOSPHATE 10 MG/ML IJ SOLN
INTRAMUSCULAR | Status: AC
  Filled 2022-03-27: qty 1

## 2022-03-27 MED ORDER — DEXAMETHASONE SODIUM PHOSPHATE 4 MG/ML IJ SOLN
INTRAMUSCULAR | Status: DC | PRN
  Administered 2022-03-27: 10 mg via INTRAVENOUS

## 2022-03-27 MED ORDER — VANCOMYCIN HCL IN DEXTROSE 1-5 GM/200ML-% IV SOLN
INTRAVENOUS | Status: AC
  Filled 2022-03-27: qty 200

## 2022-03-27 MED ORDER — OXYCODONE HCL 5 MG PO TABS
2.5000 mg | ORAL_TABLET | Freq: Once | ORAL | Status: AC
  Administered 2022-03-27: 2.5 mg via ORAL

## 2022-03-27 MED ORDER — MIDAZOLAM HCL 2 MG/2ML IJ SOLN
INTRAMUSCULAR | Status: AC
  Filled 2022-03-27: qty 2

## 2022-03-27 MED ORDER — HYDROMORPHONE HCL 1 MG/ML IJ SOLN: 0.2500 mg | INTRAMUSCULAR | Status: DC | PRN

## 2022-03-27 MED ORDER — KETOROLAC TROMETHAMINE 30 MG/ML IJ SOLN: 15.0000 mg | Freq: Once | INTRAMUSCULAR | Status: DC | PRN

## 2022-03-27 MED ORDER — FENTANYL CITRATE (PF) 100 MCG/2ML IJ SOLN
INTRAMUSCULAR | Status: AC
  Filled 2022-03-27: qty 2

## 2022-03-27 MED ORDER — GABAPENTIN 300 MG PO CAPS
ORAL_CAPSULE | ORAL | Status: AC
  Filled 2022-03-27: qty 1

## 2022-03-27 MED ORDER — SODIUM CHLORIDE 0.9 % IR SOLN
Status: DC | PRN
  Administered 2022-03-27: 1000 mL via INTRAVESICAL

## 2022-03-27 MED ORDER — ONDANSETRON HCL 4 MG/2ML IJ SOLN
4.0000 mg | Freq: Once | INTRAMUSCULAR | Status: DC | PRN
Start: 2022-03-27 — End: 2022-03-27

## 2022-03-27 MED ORDER — PHENAZOPYRIDINE HCL 100 MG PO TABS
200.0000 mg | ORAL_TABLET | ORAL | Status: AC
  Administered 2022-03-27: 200 mg via ORAL

## 2022-03-27 MED ORDER — POVIDONE-IODINE 10 % EX SWAB: 2.0000 | Freq: Once | CUTANEOUS | Status: DC

## 2022-03-27 MED ORDER — EPHEDRINE SULFATE (PRESSORS) 50 MG/ML IJ SOLN
INTRAMUSCULAR | Status: DC | PRN
  Administered 2022-03-27 (×2): 10 mg via INTRAVENOUS
  Administered 2022-03-27: 5 mg via INTRAVENOUS

## 2022-03-27 MED ORDER — GABAPENTIN 300 MG PO CAPS
300.0000 mg | ORAL_CAPSULE | ORAL | Status: AC
  Administered 2022-03-27: 300 mg via ORAL

## 2022-03-27 MED ORDER — AMISULPRIDE (ANTIEMETIC) 5 MG/2ML IV SOLN: 10.0000 mg | Freq: Once | INTRAVENOUS | Status: DC | PRN

## 2022-03-27 MED ORDER — VANCOMYCIN HCL IN DEXTROSE 1-5 GM/200ML-% IV SOLN
1000.0000 mg | Freq: Once | INTRAVENOUS | Status: AC
  Administered 2022-03-27: 1000 mg via INTRAVENOUS

## 2022-03-27 SURGICAL SUPPLY — 38 items
AGENT HMST KT MTR STRL THRMB (HEMOSTASIS)
BLADE CLIPPER SENSICLIP SURGIC (BLADE) ×4 IMPLANT
BLADE SURG 15 STRL LF DISP TIS (BLADE) ×3 IMPLANT
BLADE SURG 15 STRL SS (BLADE) ×3
DECANTER SPIKE VIAL GLASS SM (MISCELLANEOUS) ×1 IMPLANT
DEVICE CAPIO SLIM SINGLE (INSTRUMENTS) ×1 IMPLANT
ELECT REM PT RETURN 9FT ADLT (ELECTROSURGICAL) ×3
ELECTRODE REM PT RTRN 9FT ADLT (ELECTROSURGICAL) IMPLANT
GAUZE 4X4 16PLY ~~LOC~~+RFID DBL (SPONGE) ×4 IMPLANT
GLOVE BIO SURGEON STRL SZ 6 (GLOVE) ×4 IMPLANT
GLOVE BIOGEL PI IND STRL 6.5 (GLOVE) ×3 IMPLANT
GLOVE BIOGEL PI IND STRL 7.0 (GLOVE) ×3 IMPLANT
GLOVE BIOGEL PI INDICATOR 6.5 (GLOVE) ×1
GLOVE BIOGEL PI INDICATOR 7.0 (GLOVE) ×1
GOWN STRL REUS W/TWL LRG LVL3 (GOWN DISPOSABLE) ×4 IMPLANT
HIBICLENS CHG 4% 4OZ BTL (MISCELLANEOUS) ×4 IMPLANT
HOLDER FOLEY CATH W/STRAP (MISCELLANEOUS) ×4 IMPLANT
KIT TURNOVER CYSTO (KITS) ×4 IMPLANT
MANIFOLD NEPTUNE II (INSTRUMENTS) ×4 IMPLANT
NDL MAYO 6 CRC TAPER PT (NEEDLE) IMPLANT
NEEDLE HYPO 22GX1.5 SAFETY (NEEDLE) ×4 IMPLANT
NEEDLE MAYO 6 CRC TAPER PT (NEEDLE) ×3 IMPLANT
NS IRRIG 1000ML POUR BTL (IV SOLUTION) ×4 IMPLANT
PACK CYSTO (CUSTOM PROCEDURE TRAY) ×4 IMPLANT
PACK VAGINAL WOMENS (CUSTOM PROCEDURE TRAY) ×4 IMPLANT
RETRACTOR LONE STAR DISPOSABLE (INSTRUMENTS) ×4 IMPLANT
RETRACTOR STAY HOOK 5MM (MISCELLANEOUS) ×4 IMPLANT
SET IRRIG Y TYPE TUR BLADDER L (SET/KITS/TRAYS/PACK) IMPLANT
SURGIFLO W/THROMBIN 8M KIT (HEMOSTASIS) IMPLANT
SUT ABS MONO DBL WITH NDL 48IN (SUTURE) ×2 IMPLANT
SUT VIC AB 0 CT1 27 (SUTURE)
SUT VIC AB 0 CT1 27XBRD ANTBC (SUTURE) IMPLANT
SUT VIC AB 2-0 SH 27 (SUTURE) ×6
SUT VIC AB 2-0 SH 27XBRD (SUTURE) IMPLANT
SUT VICRYL 2-0 SH 8X27 (SUTURE) ×4 IMPLANT
SYR BULB EAR ULCER 3OZ GRN STR (SYRINGE) ×4 IMPLANT
TOWEL OR 17X26 10 PK STRL BLUE (TOWEL DISPOSABLE) ×4 IMPLANT
TRAY FOLEY W/BAG SLVR 14FR LF (SET/KITS/TRAYS/PACK) ×4 IMPLANT

## 2022-03-27 NOTE — Anesthesia Postprocedure Evaluation (Signed)
Anesthesia Post Note  Patient: Lindsey [name]  Procedure(s) Performed: ANTERIOR REPAIR WITH SACROSPINOUS FIXATION (Vagina ) CYSTOSCOPY (Bladder)     Patient location during evaluation: PACU Anesthesia Type: General Level of consciousness: awake and alert, oriented and patient cooperative Pain management: pain level controlled Vital Signs Assessment: post-procedure vital signs reviewed and stable Respiratory status: spontaneous breathing, nonlabored ventilation and respiratory function stable Cardiovascular status: blood pressure returned to baseline and stable Postop Assessment: no apparent nausea or vomiting Anesthetic complications: no   No notable events documented.  Last Vitals:  Vitals:   03/27/22 1030 03/27/22 1045  BP: 133/73 126/68  Pulse: 79 78  Resp: 12 (!) 9  Temp:    SpO2: 99% 98%    Last Pain:  Vitals:   03/27/22 1030  TempSrc:   PainSc: 0-No pain                 Pervis Hocking

## 2022-03-27 NOTE — Telephone Encounter (Signed)
Lindsey [name] underwent anterior repair, sacrospinous fixation, cystoscopy on 03/27/22.   She failed her voiding trial.  373m was backfilled into the bladder Voided 1540m PVR by bladder scan was 35037m  She was discharged with a catheter. Please call her for a routine post op check and to schedule a voiding trial by Thursday 7/27. Thanks!  MicJaquita FoldsD

## 2022-03-27 NOTE — Interval H&P Note (Signed)
History and Physical Interval Note:  03/27/2022 8:21 AM  Lindsey [name]  has presented today for surgery, with the diagnosis of anterior vaginal prolapse; prolapse of vaginal vault after hysterectomy.  The various methods of treatment have been discussed with the patient and family. After consideration of risks, benefits and other options for treatment, the patient has consented to  Procedure(s): ANTERIOR REPAIR WITH POSSIBLE SACROSPINOUS FIXATION (N/A) CYSTOSCOPY (N/A) as a surgical intervention.  Vitals:   03/27/22 0716  BP: 133/84  Pulse: 70  Resp: 16  Temp: (!) 97.5 F (36.4 C)  SpO2: 99%    Gen: NAD CV: S1 S2 RRR Lungs: Clear to auscultation bilaterally Abd: soft, nontender   The patient's history has been reviewed, patient examined, no change in status, stable for surgery.  I have reviewed the patient's chart and labs.  Questions were answered to the patient's satisfaction.     [name]

## 2022-03-27 NOTE — Progress Notes (Signed)
Foley catheter inserted under sterile technique per protocol with education and observer, Trude Mcburney, RN. Pt tolerated procedure well.

## 2022-03-27 NOTE — Op Note (Signed)
Operative Note  Preoperative Diagnosis: anterior vaginal prolapse and vaginal vault prolapse after hysterectomy  Postoperative Diagnosis: same  Procedures performed:  Anterior repair, sacrospinous ligament fixation, cystoscopy  Attending Surgeon: Sherlene Shams, MD  Anesthesia: General LMA  Findings: 1. On vaginal exam, stage II prolapse noted  2. On cystoscopy, normal bladder and urethra without injury or lesion. Brisk bilateral ureteral efflux noted.    Specimens: none  Estimated blood loss: 30 mL  IV fluids: 800 mL  Urine output: 539 mL  Complications: none  Procedure in Detail:  After informed consent was obtained, the patient was taken to the operating room where anesthesia was induced and found to be adequate. She was placed in dorsal lithotomy position, taking care to avoid any traction on the extremities, and then prepped and draped in the usual sterile fashion. A self-retaining lonestar retractor was placed using four elastic blue stays.  After a foley catheter was inserted into the urethra, the location of the midurethra was palpated. Two Allis clamps were along the anterior vaginal wall defect. 1% lidocaine with epinephrine was injected into the vaginal mucosa.  A vertical incision was made between these two Allis clamps with a 15 blade scalpel.  Allis clamps were placed along this incision and Metzenbaum scissors were used to undermine the vaginal mucosa along the incision.  The vaginal mucosa was then sharply dissected off to the vesicovaginal septum bilaterally to the level of the pubic rami.    For the sacrospinous ligament fixation (SSLF), the ischial spine was accessed on the right side via dissection with Mayo scissors and blunt dissection.  The sacrospinous ligament was palpated. Two 0 PDS suture was then placed at the sacrospinous ligament two fingerbreadths medial to the ischial spine, in order to avoid the pudendal neurovascular bundle, using a Capio needle  driver.  The PDS suture was attached to the vaginal epithelium on the ipsilateral side of the vaginal apex and held. Anterior plication of the vesicovaginal septum was then performed using plicating sutures of 2-0 Vicryl. The vaginal mucosal edges were trimmed and the incision reapproximated with 2-0 Vicryl in a running fashion. The SSLF suture was then tied down with excellent support of the anterior and apical vagina.  The Foley catheter was removed.  A 70-degree cystoscope was introduced, and 360-degree inspection revealed no trauma or lesion in the bladder, with bilateral ureteral efflux.  The bladder was drained and the cystoscope was removed.  The Foley catheter was reinserted. The vagina was irrigated and hemostasis was noted. The patient tolerated the procedure well.  She was awakened from anesthesia and transferred to the recovery room in stable condition. All counts were correct x 2.   [name], MD

## 2022-03-27 NOTE — Transfer of Care (Signed)
Immediate Anesthesia Transfer of Care Note  Patient: Lindsey [name]  Procedure(s) Performed: Procedure(s) (LRB): ANTERIOR REPAIR WITH SACROSPINOUS FIXATION (N/A) CYSTOSCOPY (N/A)  Patient Location: PACU  Anesthesia Type: General  Level of Consciousness: awake, sedated, patient cooperative and responds to stimulation  Airway & Oxygen Therapy: Patient Spontanous Breathing and Patient connected to [place] oxygen  Post-op Assessment: Report given to PACU RN, Post -op Vital signs reviewed and stable and Patient moving all extremities  Post vital signs: Reviewed and stable  Complications: No apparent anesthesia complications

## 2022-03-27 NOTE — Anesthesia Procedure Notes (Signed)
Procedure Name: LMA Insertion Date/Time: 03/27/2022 8:56 AM  Performed by: Justice Rocher, CRNAPre-anesthesia Checklist: Patient identified, Emergency Drugs available, Suction available, Patient being monitored and Timeout performed Patient Re-evaluated:Patient Re-evaluated prior to induction Oxygen Delivery Method: Circle system utilized Preoxygenation: Pre-oxygenation with 100% oxygen Induction Type: IV induction Ventilation: Mask ventilation without difficulty LMA: LMA inserted LMA Size: 4.0 Number of attempts: 1 Airway Equipment and Method: Bite block Placement Confirmation: positive ETCO2, breath sounds checked- equal and bilateral and CO2 detector Tube secured with: Tape Dental Injury: Teeth and Oropharynx as per pre-operative assessment

## 2022-03-27 NOTE — Discharge Instructions (Addendum)

## 2022-03-28 ENCOUNTER — Encounter (HOSPITAL_BASED_OUTPATIENT_CLINIC_OR_DEPARTMENT_OTHER): Payer: Self-pay | Admitting: Obstetrics and Gynecology

## 2022-03-29 NOTE — Telephone Encounter (Signed)
Post- Op Call  Lindsey [name] underwent anterior repair, sacrospinous fixation, cystoscopy on 03/27/22 with Dr Wannetta Sender. The patient reports that her pain is controlled. She is taking tylenol and ibuprofen. She reports vaginal bleeding described as spotting. She has not had a bowel movement and is taking miralax for a bowel regimen. She was discharged with a catheter and will return on 03/30/22 for a voiding trial. Advised to call with any problems or concerns.   Lindsey Nicely, RN

## 2022-03-30 ENCOUNTER — Encounter: Payer: Self-pay | Admitting: *Deleted

## 2022-03-30 ENCOUNTER — Ambulatory Visit (INDEPENDENT_AMBULATORY_CARE_PROVIDER_SITE_OTHER): Payer: BC Managed Care – PPO

## 2022-03-30 DIAGNOSIS — Z4889 Encounter for other specified surgical aftercare: Secondary | ICD-10-CM

## 2022-03-30 NOTE — Progress Notes (Unsigned)
Lindsey [name] is a 66 y.o. female called and notified of that FMLA paper were received by the [place] from [employer] Pt notified there will be a 10-14 day turn around for forms to be ready.  Pt notified  she will be contacted as soon as the from are ready and have been faxed.  Pre-op date: 02/28/2022 Surgery date: 03/27/2022 Post op date:05/05/2022 -----  Pt was notified that all forma have been faxed back to [employer] at [phone]

## 2022-03-30 NOTE — Patient Instructions (Signed)
Please keep all post op appointments. If you have any questions or concerns please give [place] a call at [phone] or you can message [place] on MyChart

## 2022-03-30 NOTE — Progress Notes (Signed)
Urogyn Nurse Voiding Trial Note  Lindsey [name] underwent ANTERIOR REPAIR WITH SACROSPINOUS FIXATION (Vagina )  CYSTOSCOPY (Bladder) on 03/27/22.  Lindsey [name] presents today for a voiding trial .   Patient was identified with 2 indicators. 252m of NS was instilled into the bladder via the catheter. The catheter was removed and patient was instructed to void into the urinary hat. Lindsey [name] voided 2868m The post void residual measured by bladder scan was 1763mShe passed the voiding trial and a catheter was not replaced.   The patient received aftercare instructions and will follow up as scheduled.

## 2022-04-06 ENCOUNTER — Other Ambulatory Visit: Payer: Self-pay | Admitting: Physician Assistant

## 2022-04-20 ENCOUNTER — Telehealth: Payer: Self-pay | Admitting: Obstetrics and Gynecology

## 2022-04-21 ENCOUNTER — Ambulatory Visit: Payer: BC Managed Care – PPO | Admitting: Obstetrics and Gynecology

## 2022-04-21 ENCOUNTER — Encounter: Payer: Self-pay | Admitting: Obstetrics and Gynecology

## 2022-04-21 ENCOUNTER — Other Ambulatory Visit: Payer: Self-pay | Admitting: Family Medicine

## 2022-04-21 VITALS — BP 159/93 | HR 84

## 2022-04-21 DIAGNOSIS — Z9889 Other specified postprocedural states: Secondary | ICD-10-CM

## 2022-04-21 NOTE — Progress Notes (Signed)
[facility]  Date of Visit: 04/21/2022  History of Present Illness: Lindsey [name] is a 66 y.o. female scheduled today for a post-operative visit.   Surgery: s/p Anterior repair, sacrospinous ligament fixation, cystoscopy on 03/27/22  She did not pass her postoperative void trial- returned to office on 03/30/22 and passed, catheter removed.   Today she reports a few days ago, she started feeling pain in her rectum. Feels achey and uncomfortable to sit. Pain is improved with tylenol. Has been having regular bowel movements, not needing to strain. Denies vaginal bleeding.    Medications: She has a current medication list which includes the following prescription(s): acetaminophen, aspirin ec, calcium carb-cholecalciferol, contour next test, ibuprofen, microlet next lancing device, lisinopril, lorazepam, magnesium, omega-3 acid ethyl esters, omeprazole, ozempic (0.25 or 0.5 mg/dose), polyethylene glycol powder, pravastatin, probiotic product, sumatriptan, synjardy xr, and zolpidem.   Allergies: Patient is allergic to amoxicillin, trulicity [dulaglutide], citalopram, erythromycin, hydrocodone, sertraline, sulfa antibiotics, tramadol, trazodone and nefazodone, and venlafaxine.   Physical Exam: There were no vitals taken for this visit.   Pelvic Examination: Vagina: Incisions healing well. Sutures are present at incision line and there is not granulation tissue. No tenderness along the anterior or posterior vagina. No apical tenderness. No pelvic masses.  Rectum: small hemorrhoid present, mild tenderness with plapation. Good sphincter tone. No rectal masses. Some tenderness with palpation of puborectalis/ anal sphincter  POP-Q: deferred  ---------------------------------------------------------  Assessment and Plan:  1. Post-operative state     - Small hemorrhoid and some muscle tension noted on exam. Recommended using some preparation H for comfort. She feels taking tylenol is  adequate.  - Otherwise healing well from surgery  Follow up 2 weeks  [name], MD

## 2022-04-24 ENCOUNTER — Other Ambulatory Visit: Payer: Self-pay | Admitting: Family Medicine

## 2022-04-24 ENCOUNTER — Other Ambulatory Visit: Payer: BC Managed Care – PPO

## 2022-04-24 DIAGNOSIS — E1159 Type 2 diabetes mellitus with other circulatory complications: Secondary | ICD-10-CM | POA: Diagnosis not present

## 2022-04-24 DIAGNOSIS — E782 Mixed hyperlipidemia: Secondary | ICD-10-CM

## 2022-04-24 DIAGNOSIS — E1169 Type 2 diabetes mellitus with other specified complication: Secondary | ICD-10-CM | POA: Diagnosis not present

## 2022-04-24 DIAGNOSIS — I152 Hypertension secondary to endocrine disorders: Secondary | ICD-10-CM | POA: Diagnosis not present

## 2022-04-25 ENCOUNTER — Encounter: Payer: Self-pay | Admitting: Family Medicine

## 2022-04-25 LAB — CBC WITH DIFF/PLATELET
Basophils Absolute: 0.1 10*3/uL (ref 0.0–0.2)
Basos: 1 %
EOS (ABSOLUTE): 0.1 10*3/uL (ref 0.0–0.4)
Eos: 2 %
Hematocrit: 42.1 % (ref 34.0–46.6)
Hemoglobin: 14 g/dL (ref 11.1–15.9)
Immature Grans (Abs): 0 10*3/uL (ref 0.0–0.1)
Immature Granulocytes: 0 %
Lymphocytes Absolute: 1.9 10*3/uL (ref 0.7–3.1)
Lymphs: 35 %
MCH: 28.2 pg (ref 26.6–33.0)
MCHC: 33.3 g/dL (ref 31.5–35.7)
MCV: 85 fL (ref 79–97)
Monocytes Absolute: 0.5 10*3/uL (ref 0.1–0.9)
Monocytes: 9 %
Neutrophils Absolute: 2.9 10*3/uL (ref 1.4–7.0)
Neutrophils: 53 %
Platelets: 400 10*3/uL (ref 150–450)
RBC: 4.96 x10E6/uL (ref 3.77–5.28)
RDW: 14 % (ref 11.7–15.4)
WBC: 5.4 10*3/uL (ref 3.4–10.8)

## 2022-04-25 LAB — COMPREHENSIVE METABOLIC PANEL
ALT: 10 IU/L (ref 0–32)
AST: 16 IU/L (ref 0–40)
Albumin/Globulin Ratio: 1.7 (ref 1.2–2.2)
Albumin: 4.4 g/dL (ref 3.9–4.9)
Alkaline Phosphatase: 69 IU/L (ref 44–121)
BUN/Creatinine Ratio: 20 (ref 12–28)
BUN: 20 mg/dL (ref 8–27)
Bilirubin Total: 0.6 mg/dL (ref 0.0–1.2)
CO2: 26 mmol/L (ref 20–29)
Calcium: 10.3 mg/dL (ref 8.7–10.3)
Chloride: 95 mmol/L — ABNORMAL LOW (ref 96–106)
Creatinine, Ser: 0.98 mg/dL (ref 0.57–1.00)
Globulin, Total: 2.6 g/dL (ref 1.5–4.5)
Glucose: 107 mg/dL — ABNORMAL HIGH (ref 70–99)
Potassium: 4.1 mmol/L (ref 3.5–5.2)
Sodium: 136 mmol/L (ref 134–144)
Total Protein: 7 g/dL (ref 6.0–8.5)
eGFR: 64 mL/min/{1.73_m2} (ref >59)

## 2022-04-25 LAB — HEMOGLOBIN A1C
Est. average glucose Bld gHb Est-mCnc: 126 mg/dL
Hgb A1c MFr Bld: 6 % — ABNORMAL HIGH (ref 4.8–5.6)

## 2022-04-25 LAB — LIPID PANEL
Chol/HDL Ratio: 2.3 ratio (ref 0.0–4.4)
Cholesterol, Total: 136 mg/dL (ref 100–199)
HDL: 60 mg/dL (ref >39)
LDL Chol Calc (NIH): 53 mg/dL (ref 0–99)
Triglycerides: 133 mg/dL (ref 0–149)
VLDL Cholesterol Cal: 23 mg/dL (ref 5–40)

## 2022-04-25 LAB — CARDIOVASCULAR RISK ASSESSMENT

## 2022-04-25 NOTE — Progress Notes (Signed)
Blood count normal.  Liver function normal.  Kidney function normal.  Cholesterol: good. HBA1C: 6.

## 2022-04-28 ENCOUNTER — Ambulatory Visit: Payer: BC Managed Care – PPO | Admitting: Family Medicine

## 2022-04-28 ENCOUNTER — Encounter: Payer: Self-pay | Admitting: Family Medicine

## 2022-04-28 VITALS — BP 106/70 | HR 72 | Temp 98.3°F | Resp 18 | Ht 64.0 in | Wt 151.4 lb

## 2022-04-28 DIAGNOSIS — I152 Hypertension secondary to endocrine disorders: Secondary | ICD-10-CM

## 2022-04-28 DIAGNOSIS — K219 Gastro-esophageal reflux disease without esophagitis: Secondary | ICD-10-CM

## 2022-04-28 DIAGNOSIS — Z6825 Body mass index (BMI) 25.0-25.9, adult: Secondary | ICD-10-CM

## 2022-04-28 DIAGNOSIS — I10 Essential (primary) hypertension: Secondary | ICD-10-CM

## 2022-04-28 DIAGNOSIS — E782 Mixed hyperlipidemia: Secondary | ICD-10-CM

## 2022-04-28 DIAGNOSIS — F5101 Primary insomnia: Secondary | ICD-10-CM | POA: Diagnosis not present

## 2022-04-28 DIAGNOSIS — G43009 Migraine without aura, not intractable, without status migrainosus: Secondary | ICD-10-CM

## 2022-04-28 DIAGNOSIS — Z90710 Acquired absence of both cervix and uterus: Secondary | ICD-10-CM

## 2022-04-28 DIAGNOSIS — E1121 Type 2 diabetes mellitus with diabetic nephropathy: Secondary | ICD-10-CM

## 2022-04-28 DIAGNOSIS — E1159 Type 2 diabetes mellitus with other circulatory complications: Secondary | ICD-10-CM

## 2022-04-28 DIAGNOSIS — E1169 Type 2 diabetes mellitus with other specified complication: Secondary | ICD-10-CM

## 2022-04-28 DIAGNOSIS — Z1211 Encounter for screening for malignant neoplasm of colon: Secondary | ICD-10-CM

## 2022-04-28 NOTE — Assessment & Plan Note (Addendum)
Well controlled. Recommend check sugars fasting daily. Recommend check feet daily. Recommend annual eye exams. Medicines: Synjardy XR 12.01/999 mg 2 daily and ozempic. Ozempic 0.5 mg weekly and aspirin 81 mg daily. Continue to work on eating a healthy diet and exercise.    

## 2022-04-28 NOTE — Assessment & Plan Note (Addendum)
Well controlled. No changes to medicines.  Continue to work on eating a healthy diet and exercise.    

## 2022-04-28 NOTE — Assessment & Plan Note (Addendum)
Well controlled. Recommend check sugars fasting daily. Recommend check feet daily. Recommend annual eye exams. Medicines: Lisinopril 40 mg twice daily and ozempic and synjardy xr. Continue to work on eating a healthy diets and exercise.

## 2022-04-28 NOTE — Progress Notes (Signed)
Subjective:  Patient ID: Lindsey [name], female    DOB: [birth year]  Age: 66 y.o. MRN: [id number]  Chief Complaint  Patient presents with   Diabetes   Hypertension    Diabetes Pertinent negatives for hypoglycemia include no dizziness, headaches or nervousness/anxiousness. Pertinent negatives for diabetes include no chest pain, no fatigue and no weakness.  Patient had bladder prolapse repair about 5 weeks ago.   Diabetes:  Complications: glomerulopathy Glucose checking: daily. Glucose logs: Hypoglycemia: no  Most recent A1C: 6.0 Current medications: Synjardy XR 12.01/999 mg 2 daily Ozempic 0.5 mg weekly and aspirin 81 mg daily. Last Eye Exam: Up-to-date Foot checks: Daily  Hyperlipidemia: Current medications: pravachol 10 mg before bed.  Lovaza 1 g 2 capsules twice daily.  Migraines: Using Imitrex sporadically.   Hypertension: Current medications: Lisinopril 40 mg twice daily  Insomnia: Well-controlled with zolpidem 12.5 mg CR 1 p.o. nightly.  Diet: Healthy. No exercise.  Current Outpatient Medications on File Prior to Visit  Medication Sig Dispense Refill   aspirin EC 81 MG tablet Take 81 mg by mouth every morning.     Calcium Carb-Cholecalciferol (CALCIUM 600 + D PO) Take 1 tablet by mouth at bedtime.     glucose blood (CONTOUR NEXT TEST) test strip Use as instructed 100 each 3   Lancet Devices (MICROLET NEXT LANCING DEVICE) MISC 1 each by Does not apply route in the morning. 1 each 0   lisinopril (ZESTRIL) 40 MG tablet TAKE 1 TABLET BY MOUTH TWICE DAILY IN THE MORNING AND AT BEDTIME. 180 tablet 0   LORazepam (ATIVAN) 0.5 MG tablet TAKE 1 TABLET BY MOUTH ONCE DAILY AS NEEDED FOR ANXIETY. 30 tablet 1   Magnesium 250 MG TABS Take by mouth in the morning and at bedtime.     omega-3 acid ethyl esters (LOVAZA) 1 g capsule TAKE 2 CAPSULES BY MOUTH  TWICE DAILY 360 capsule 3   omeprazole (PRILOSEC) 20 MG capsule TAKE 1 CAPSULE BY MOUTH  ONCE DAILY BEFORE A MEAL 90 capsule 3    OZEMPIC, 0.25 OR 0.5 MG/DOSE, 2 MG/1.5ML SOPN INJECT 0.5 MG DOSE INTO THE SKIN ONCE A WEEK. 4.5 mL 1   pravastatin (PRAVACHOL) 10 MG tablet TAKE 1 TABLET BY MOUTH ONCE DAILY 90 tablet 3   Probiotic Product (PROBIOTIC BLEND PO) Take by mouth daily.     SUMAtriptan (IMITREX) 100 MG tablet TAKE 1 TABLET BY MOUTH AT ONSET  OF MIGRAINE MAY REPEAT IN 2  HOURS AS NEEDED . MAX OF 2  TABLETS DAILY 27 tablet 1   SYNJARDY XR 12.01-999 MG TB24 TAKE 2 TABLETS BY MOUTH DAILY 180 tablet 3   zolpidem (AMBIEN CR) 12.5 MG CR tablet Take 1 tablet (12.5 mg total) by mouth at bedtime. 90 tablet 1   No current facility-administered medications on file prior to visit.   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 Rochel Brome, [place] 12/19/21 in Epic as of 03/15/22.   Dyslipidemia associated with type 2 diabetes mellitus [facility])    Patient follows with Dr. Rochel Brome, [place] 12/19/21 in Epic as of 03/15/22.   Frequent UTI     h/o since hysterectomy/04/01/13 asymptomatic   GERD (gastroesophageal reflux disease)    Takes Omeprazole daily  as of 03/15/22.   Headache(784.0)    migraines   Hypertension    Currently on Lisinopril. Follows w/ PCP Dr. Rochel Brome, [place] 12/19/21 as of 03/15/22.   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 Overview:  The patient in 09/2009 underwent TAHBSO with the findings of a ruptured 10 cm right ovarian mass. 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   Past Surgical History:  Procedure Laterality Date   ABDOMINAL HYSTERECTOMY  2011   Stromal ovarian cancer   ANTERIOR AND POSTERIOR REPAIR WITH SACROSPINOUS FIXATION N/A 03/27/2022   Procedure:  ANTERIOR REPAIR WITH SACROSPINOUS FIXATION;  Surgeon: [name], MD;  Location: [facility];  Service: Gynecology;  Laterality: N/A;   CHOLECYSTECTOMY  1989   COLONOSCOPY  2010   no polyps per pt   CYSTOSCOPY N/A 03/27/2022   Procedure: CYSTOSCOPY;  Surgeon: [name], MD;  [facility]: [facility];  Service: Gynecology;  Laterality: N/A;   ENTEROCELE REPAIR     around 2007   KNEE ARTHROSCOPY Right    many yrs ago before knee replacemnt   KNEE ARTHROSCOPY Right 07/01/2014   Procedure: RIGHT ARTHROSCOPY KNEE WITH SYNOVECTOMY;  Surgeon: [name], MD;  Location: [facility];  Service: Orthopedics;  Laterality: Right;   RECTOCELE REPAIR     around 2007   [place] Right 04/07/2013   Procedure: RIGHT TOTAL KNEE ARTHROPLASTY;  Surgeon: [name], MD;  Location: [facility];  Service: Orthopedics;  Laterality: Right;    Family History  Problem Relation Age of Onset   Hyperlipidemia Mother    Hypertension Mother    Transient ischemic attack Mother    Hyperlipidemia Father    Hypertension Father    Lung cancer Father    Social History   Socioeconomic History   Marital status: Married    Spouse name: Not on file   Number of children: Not on file   Years of education: Not on file   Highest education level: Not on file  Occupational History   Not on file  Tobacco Use   Smoking status: Never   Smokeless tobacco: Never  Vaping Use   Vaping Use: Never used  Substance and Sexual Activity   Alcohol use: No   Drug use: No   Sexual activity: Yes    Birth control/protection: Surgical    Comment: hysterectomy  Other Topics Concern   Not on file  Social History Narrative   Not on file   Social Determinants of Health   Financial Resource Strain: Not on file  Food Insecurity: Not on file  Transportation Needs: Not on file  Physical Activity: Inactive (08/08/2021)   Exercise Vital Sign    Days  of Exercise per Week: 0 days    Minutes of Exercise per Session: 0 min  Stress: No Stress Concern Present (08/08/2021)   [place]    Feeling of Stress : Not at all  Social Connections: Moderately Integrated (08/08/2021)   Social Connection and Isolation Panel [NHANES]    Frequency of Communication with Friends and Family: More than three times a week    Frequency of Social Gatherings with Friends and Family: Once a week    Attends Religious Services: More than 4 times per year    Active  Member of Clubs or Organizations: No    Attends Archivist Meetings: Never    Marital Status: Married    Review of Systems  Constitutional:  Negative for appetite change, fatigue and fever.  HENT:  Negative for congestion, ear pain, sinus pressure and sore throat.   Respiratory:  Negative for cough, shortness of breath and wheezing.   Cardiovascular:  Negative for chest pain and palpitations.  Gastrointestinal:  Negative for abdominal pain, constipation, diarrhea, nausea and vomiting.  Genitourinary:  Negative for dysuria and frequency.  Musculoskeletal:  Negative for arthralgias, back pain, joint swelling and myalgias.  Skin:  Negative for rash.  Neurological:  Negative for dizziness, weakness and headaches.  Psychiatric/Behavioral:  Negative for dysphoric mood. The patient is not nervous/anxious.      Objective:  BP 106/70   Pulse 72   Temp 98.3 F (36.8 C)   Resp 18   Ht '5\' 4"'$  (1.626 m)   Wt 151 lb 6.4 oz (68.7 kg)   SpO2 97%   BMI 25.99 kg/m      05/05/2022   12:01 PM 04/28/2022    7:35 AM 04/21/2022    2:27 PM  BP/Weight  Systolic BP 696 295 284  Diastolic BP 81 70 93  Wt. (Lbs)  151.4   BMI  25.99 kg/m2     Physical Exam Vitals reviewed.  Constitutional:      Appearance: Normal appearance. She is normal weight.  Neck:     Vascular: No carotid bruit.  Cardiovascular:     Rate and Rhythm: Normal rate  and regular rhythm.     Heart sounds: Normal heart sounds.  Pulmonary:     Effort: Pulmonary effort is normal. No respiratory distress.     Breath sounds: Normal breath sounds.  Abdominal:     General: Abdomen is flat. Bowel sounds are normal.     Palpations: Abdomen is soft.     Tenderness: There is no abdominal tenderness.  Neurological:     Mental Status: She is alert and oriented to person, place, and time.  Psychiatric:        Mood and Affect: Mood normal.        Behavior: Behavior normal.     Diabetic Foot Exam - Simple   Simple Foot Form Diabetic Foot exam was performed with the following findings: Yes 04/28/2022  7:57 AM  Visual Inspection No deformities, no ulcerations, no other skin breakdown bilaterally: Yes Sensation Testing Intact to touch and monofilament testing bilaterally: Yes Pulse Check Posterior Tibialis and Dorsalis pulse intact bilaterally: Yes Comments      Lab Results  Component Value Date   WBC 5.4 04/24/2022   HGB 14.0 04/24/2022   HCT 42.1 04/24/2022   PLT 400 04/24/2022   GLUCOSE 107 (H) 04/24/2022   CHOL 136 04/24/2022   TRIG 133 04/24/2022   HDL 60 04/24/2022   LDLCALC 53 04/24/2022   ALT 10 04/24/2022   AST 16 04/24/2022   NA 136 04/24/2022   K 4.1 04/24/2022   CL 95 (L) 04/24/2022   CREATININE 0.98 04/24/2022   BUN 20 04/24/2022   CO2 26 04/24/2022   INR 0.92 04/01/2013   HGBA1C 6.0 (H) 04/24/2022   MICROALBUR 10 06/29/2021      Assessment & Plan:   Problem List Items Addressed This Visit       Cardiovascular and Mediastinum   Hypertension associated with diabetes ([place])    Well controlled. Recommend check sugars fasting daily. Recommend check  feet daily. Recommend annual eye exams. Medicines: Lisinopril 40 mg twice daily and ozempic and synjardy xr. Continue to work on eating a healthy diets and exercise.        Migraine without aura and without status migrainosus, not intractable    The current medical regimen is  effective;  continue present plan and medications. Continue imitrex as needed.           RESOLVED: Essential hypertension, benign    Well controlled.  No changes to medicines.  Continue to work on eating a healthy diet and exercise.          Digestive   Gastroesophageal reflux disease without esophagitis    The current medical regimen is effective;  continue present plan and medications. Continue omeprazole 20 mg once daily.         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 Recommend check sugars fasting daily. Recommend check feet daily. Recommend annual eye exams. Medicines: Synjardy XR 12.01/999 mg 2 daily and ozempic. Ozempic 0.5 mg weekly and aspirin 81 mg daily. Continue to work on eating a healthy diet and exercise.         Relevant Orders   Microalbumin / creatinine urine ratio (Completed)     Genitourinary   Absence of both cervix and uterus, acquired    No pap needed.        Other   Mixed hyperlipidemia    Well controlled.  No changes to medicines. Continue pravastatin 10 mg daily and lovaza 1 gm 2 capsules twice daily.  Continue to work on eating a healthy diet and exercise.  Labs drawn today.        Primary insomnia    The current medical regimen is effective;  continue present plan and medications. zolpidem 12.5 mg CR 1 p.o. nightly.      BMI 25.0-25.9,adult    Recommend continue to work on eating healthy diet and exercise.       Screen for colon cancer    Ordered cologuard.       Relevant Orders   Cologuard  .  Orders Placed This Encounter  Procedures   Microalbumin / creatinine urine ratio   Cologuard    I,Shaylon Gillean,acting as a scribe for Rochel Brome, MD.,have documented all relevant documentation on the behalf of Rochel Brome, MD,as directed by  Rochel Brome, MD while in the presence of Rochel Brome, MD.   Follow-up: Return in about 6 months (around 10/29/2022) for chronic follow up, lab visit a  couple days prior to appt. .  An After Visit Summary was printed and given to the patient.  Rochel Brome, MD [facility] [phone]

## 2022-04-28 NOTE — Assessment & Plan Note (Addendum)
The current medical regimen is effective;  continue present plan and medications. zolpidem 12.5 mg CR 1 p.o. nightly.

## 2022-04-28 NOTE — Assessment & Plan Note (Addendum)
The current medical regimen is effective;  continue present plan and medications. Continue imitrex as needed.

## 2022-04-29 LAB — MICROALBUMIN / CREATININE URINE RATIO
Creatinine, Urine: 125.2 mg/dL
Microalb/Creat Ratio: 18 mg/g creat (ref 0–29)
Microalbumin, Urine: 22.2 ug/mL

## 2022-05-03 DIAGNOSIS — Z1211 Encounter for screening for malignant neoplasm of colon: Secondary | ICD-10-CM | POA: Diagnosis not present

## 2022-05-05 ENCOUNTER — Ambulatory Visit (INDEPENDENT_AMBULATORY_CARE_PROVIDER_SITE_OTHER): Payer: BC Managed Care – PPO | Admitting: Obstetrics and Gynecology

## 2022-05-05 ENCOUNTER — Encounter: Payer: Self-pay | Admitting: Obstetrics and Gynecology

## 2022-05-05 VITALS — BP 121/81 | HR 97

## 2022-05-05 DIAGNOSIS — Z1211 Encounter for screening for malignant neoplasm of colon: Secondary | ICD-10-CM | POA: Insufficient documentation

## 2022-05-05 DIAGNOSIS — Z9889 Other specified postprocedural states: Secondary | ICD-10-CM

## 2022-05-05 DIAGNOSIS — Z90710 Acquired absence of both cervix and uterus: Secondary | ICD-10-CM | POA: Insufficient documentation

## 2022-05-05 NOTE — Assessment & Plan Note (Signed)
Recommend continue to work on eating healthy diet and exercise.  

## 2022-05-05 NOTE — Assessment & Plan Note (Signed)
Well controlled.  No changes to medicines. Continue pravastatin 10 mg daily and lovaza 1 gm 2 capsules twice daily.  Continue to work on eating a healthy diet and exercise.  Labs reviewed 

## 2022-05-05 NOTE — Assessment & Plan Note (Signed)
Ordered cologuard.

## 2022-05-05 NOTE — Assessment & Plan Note (Signed)
No pap needed.

## 2022-05-05 NOTE — Assessment & Plan Note (Signed)
The current medical regimen is effective;  continue present plan and medications. Continue omeprazole 20 mg once daily.  

## 2022-05-05 NOTE — Progress Notes (Signed)
[facility]  Date of Visit: 05/05/2022  History of Present Illness: Ms. Lindsey [name] is a 66 y.o. female scheduled today for a post-operative visit.   Surgery: s/p Anterior repair, sacrospinous ligament fixation, cystoscopy on 03/27/22  She did not pass her postoperative void trial- returned to office on 03/30/22 and passed, catheter removed.   Was seen two weeks ago due to rectal pain and thought to be due to hemorrhoids. Has a little bit of leakage on her pad but she is not always aware (pad turns blue). Does not feel it is that bothersome other than needing to wear pads.   UTI in the last 6 weeks? No  Pain?  Only when she is on her feet for a long time She has returned to her normal activity (except for postop restrictions) Vaginal bulge? No  Stress incontinence: No  Urgency/frequency: No  Urge incontinence: Yes - not always  Voiding dysfunction: No Bowel issues: No   Subjective Success: Do you usually have a bulge or something falling out that you can see or feel in the vaginal area? No  Retreatment Success: Any retreatment with surgery or pessary for any compartment? No    Medications: She has a current medication list which includes the following prescription(s): aspirin ec, calcium carb-cholecalciferol, contour next test, microlet next lancing device, lisinopril, lorazepam, magnesium, omega-3 acid ethyl esters, omeprazole, ozempic (0.25 or 0.5 mg/dose), pravastatin, probiotic product, sumatriptan, synjardy xr, and zolpidem.   Allergies: Patient is allergic to amoxicillin, trulicity [dulaglutide], citalopram, erythromycin, hydrocodone, sertraline, sulfa antibiotics, tramadol, trazodone and nefazodone, and venlafaxine.   Physical Exam: BP 121/81   Pulse 97    Pelvic Examination: Vagina: Incisions healing well. Sutures are present at incision line and at the vaginal apex, healing well and there is not granulation tissue. No tenderness along the anterior or posterior vagina.  No apical tenderness. No pelvic masses.   POP-Q: POP-Q  -3                                            Aa   -3                                           Ba  -7                                              C   2.5                                            Gh  6                                            Pb  7  tvl   -3                                            Ap  -3                                            Bp                                                 D     ---------------------------------------------------------  Assessment and Plan:  1. Post-operative state    - Healing well.  - Can resume regular activity including exercise. Wait an additional two weeks for intercourse. Explained that the sutures at the top of vagina can take several months to heal.  - Discussed avoidance of heavy lifting and straining long term to reduce the risk of recurrence.  - Does not want treatment for leakage at this time since it is minimal. She will try some pelvic floor exercises at home, handouts provided.   Follow up as needed  Lindsey Folds, MD

## 2022-05-12 LAB — COLOGUARD: COLOGUARD: NEGATIVE

## 2022-05-18 ENCOUNTER — Encounter: Payer: Self-pay | Admitting: Family Medicine

## 2022-05-18 ENCOUNTER — Ambulatory Visit (INDEPENDENT_AMBULATORY_CARE_PROVIDER_SITE_OTHER): Payer: BC Managed Care – PPO

## 2022-05-18 DIAGNOSIS — Z23 Encounter for immunization: Secondary | ICD-10-CM | POA: Diagnosis not present

## 2022-05-25 ENCOUNTER — Encounter: Payer: Self-pay | Admitting: Family Medicine

## 2022-05-30 ENCOUNTER — Encounter: Payer: Self-pay | Admitting: Obstetrics and Gynecology

## 2022-06-02 NOTE — Telephone Encounter (Signed)
completed

## 2022-06-28 ENCOUNTER — Other Ambulatory Visit: Payer: Self-pay | Admitting: Family Medicine

## 2022-07-10 ENCOUNTER — Other Ambulatory Visit: Payer: Self-pay | Admitting: Family Medicine

## 2022-07-10 DIAGNOSIS — J029 Acute pharyngitis, unspecified: Secondary | ICD-10-CM | POA: Diagnosis not present

## 2022-07-10 DIAGNOSIS — J019 Acute sinusitis, unspecified: Secondary | ICD-10-CM | POA: Diagnosis not present

## 2022-07-24 ENCOUNTER — Other Ambulatory Visit: Payer: Self-pay | Admitting: Physician Assistant

## 2022-07-24 ENCOUNTER — Other Ambulatory Visit: Payer: Self-pay | Admitting: Family Medicine

## 2022-07-24 DIAGNOSIS — E1121 Type 2 diabetes mellitus with diabetic nephropathy: Secondary | ICD-10-CM

## 2022-08-14 DIAGNOSIS — N390 Urinary tract infection, site not specified: Secondary | ICD-10-CM | POA: Diagnosis not present

## 2022-08-16 ENCOUNTER — Ambulatory Visit (INDEPENDENT_AMBULATORY_CARE_PROVIDER_SITE_OTHER): Payer: BC Managed Care – PPO | Admitting: Family Medicine

## 2022-08-16 VITALS — BP 90/80 | HR 75 | Temp 97.2°F | Resp 16 | Ht 64.0 in | Wt 150.6 lb

## 2022-08-16 DIAGNOSIS — J019 Acute sinusitis, unspecified: Secondary | ICD-10-CM | POA: Insufficient documentation

## 2022-08-16 DIAGNOSIS — N3001 Acute cystitis with hematuria: Secondary | ICD-10-CM | POA: Diagnosis not present

## 2022-08-16 DIAGNOSIS — Z23 Encounter for immunization: Secondary | ICD-10-CM

## 2022-08-16 DIAGNOSIS — E1121 Type 2 diabetes mellitus with diabetic nephropathy: Secondary | ICD-10-CM

## 2022-08-16 DIAGNOSIS — I152 Hypertension secondary to endocrine disorders: Secondary | ICD-10-CM

## 2022-08-16 DIAGNOSIS — J018 Other acute sinusitis: Secondary | ICD-10-CM

## 2022-08-16 DIAGNOSIS — R300 Dysuria: Secondary | ICD-10-CM

## 2022-08-16 DIAGNOSIS — Z90710 Acquired absence of both cervix and uterus: Secondary | ICD-10-CM

## 2022-08-16 DIAGNOSIS — Z0001 Encounter for general adult medical examination with abnormal findings: Secondary | ICD-10-CM | POA: Diagnosis not present

## 2022-08-16 DIAGNOSIS — E1159 Type 2 diabetes mellitus with other circulatory complications: Secondary | ICD-10-CM

## 2022-08-16 LAB — POCT URINALYSIS DIP (CLINITEK)
Bilirubin, UA: NEGATIVE
Glucose, UA: 250 mg/dL — AB
Leukocytes, UA: NEGATIVE
Nitrite, UA: NEGATIVE
POC PROTEIN,UA: NEGATIVE
Spec Grav, UA: 1.015 (ref 1.010–1.025)
Urobilinogen, UA: 0.2 E.U./dL
pH, UA: 5 (ref 5.0–8.0)

## 2022-08-16 MED ORDER — CEFDINIR 300 MG PO CAPS: 300.0000 mg | ORAL_CAPSULE | Freq: Two times a day (BID) | ORAL | 0 refills | Status: DC

## 2022-08-16 NOTE — Patient Instructions (Addendum)
Decrease lisinopril 40 once daily at night.  Hold aspirin.  Rx: cefdinir 300 mg 2 daily. X 10 days.   Things to do to keep yourself healthy  - Exercise at least 30-45 minutes a day, 3-4 days a week.  - Eat a low-fat diet with lots of fruits and vegetables, up to 7-9 servings per day.  - Seatbelts can save your life. Wear them always.  - Smoke detectors on every level of your home, check batteries every year.  - Eye Doctor - have an eye exam every 1-2 years  - [place]. Choose someone to speak for you if you are not able. Please bring [place] a copy  - Depression is common in our stressful world.If you're feeling down or losing interest in things you normally enjoy, please come in for a visit.  - Violence - If anyone is threatening or hurting you, please call immediately.

## 2022-08-16 NOTE — Progress Notes (Signed)
Subjective:  Patient ID: Lindsey [name], female    DOB: 1956/01/01  Age: 66 y.o. MRN: [id number]  Chief Complaint  Patient presents with   Annual Exam    HPI  Well Adult Physical: Patient here for a comprehensive physical exam.The patient reports having a teledoc visit for polyuria and deep cough. Given cipro since Monday. Dysuria. No back pain, abdominal pain, or nausea. Sore throat. Covid 19 negative this morning.   Do you take any herbs or supplements that were not prescribed by a doctor? no Are you taking calcium supplements? yes Are you taking aspirin daily? yes  Encounter for general adult medical examination without abnormal findings  Physical ("At Risk" items are starred): Patient's last physical exam was 1 year ago .  Patient is not afflicted from Stress Incontinence and Urge Incontinence  Patient wears a seat belt, has smoke detectors, has carbon monoxide detectors, practices appropriate gun safety, and wears sunscreen with extended sun exposure. Dental Care: biannual cleanings, brushes and flosses daily. Ophthalmology/Optometry: Annual visit.  Hearing loss: none Vision impairments: none  Hysterectomy. Pregnancy history: 2 Safe at home: yes Self breast exams: yes  [place] Office Visit from 04/28/2022 in [place]  PHQ-2 Total Score 0         Social Hx   Social History   Socioeconomic History   Marital status: Married    Spouse name: Not on file   Number of children: Not on file   Years of education: Not on file   Highest education level: Not on file  Occupational History   Not on file  Tobacco Use   Smoking status: Never   Smokeless tobacco: Never  Vaping Use   Vaping Use: Never used  Substance and Sexual Activity   Alcohol use: No   Drug use: No   Sexual activity: Yes    Birth control/protection: Surgical    Comment: hysterectomy  Other Topics Concern   Not on file  Social History Narrative   Not on file   Social Determinants of  Health   Financial Resource Strain: Not on file  Food Insecurity: Not on file  Transportation Needs: Not on file  Physical Activity: Inactive (08/08/2021)   Exercise Vital Sign    Days of Exercise per Week: 0 days    Minutes of Exercise per Session: 0 min  Stress: No Stress Concern Present (08/08/2021)   [place] of Stress : Not at all  Social Connections: Moderately Integrated (08/08/2021)   Social Connection and Isolation Panel [NHANES]    Frequency of Communication with Friends and Family: More than three times a week    Frequency of Social Gatherings with Friends and Family: Once a week    Attends Religious Services: More than 4 times per year    Active Member of [employer] or Organizations: No    Attends Archivist Meetings: Never    Marital Status: Married   Past Medical History:  Diagnosis Date   Anxiety    generalized anxiety disorder, as of 03/15/22 pt takes Lorazepam prn   Cancer ([place]) 2011   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 Rochel Brome, [place] 12/19/21 in Epic as of 03/15/22.   Dyslipidemia associated with type 2 diabetes mellitus [facility])    Patient follows with Dr. Rochel Brome, [place] 12/19/21 in Epic  as of 03/15/22.   Frequent UTI     h/o since hysterectomy/04/01/13 asymptomatic   GERD (gastroesophageal reflux disease)    Takes Omeprazole daily as of 03/15/22.   Headache(784.0)    migraines   Hypertension    Currently on Lisinopril. Follows w/ PCP Dr. Rochel Brome, [place] 12/19/21 as of 03/15/22.   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 Overview:  The patient in 09/2009 underwent TAHBSO with the findings of a ruptured 10 cm right ovarian mass. 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   Past Surgical History:  Procedure Laterality Date   ABDOMINAL HYSTERECTOMY  2011   Stromal ovarian cancer   ANTERIOR AND POSTERIOR REPAIR WITH SACROSPINOUS FIXATION N/A 03/27/2022   Procedure: ANTERIOR REPAIR WITH SACROSPINOUS FIXATION;  Surgeon: [name], MD;  Location: [facility];  Service: Gynecology;  Laterality: N/A;   CHOLECYSTECTOMY  1989   COLONOSCOPY  2010   no polyps per pt   CYSTOSCOPY N/A 03/27/2022   Procedure: CYSTOSCOPY;  Surgeon: [name], MD;  Location: [facility];  Service: Gynecology;  Laterality: N/A;   ENTEROCELE REPAIR     around 2007   KNEE ARTHROSCOPY Right    many yrs ago before knee replacemnt   KNEE ARTHROSCOPY Right 07/01/2014   Procedure: RIGHT ARTHROSCOPY KNEE WITH SYNOVECTOMY;  Surgeon: [name], MD;  Location: [facility];  Service: Orthopedics;  Laterality: Right;   RECTOCELE REPAIR     around 2007   [place] Right 04/07/2013   Procedure: RIGHT TOTAL KNEE ARTHROPLASTY;  Surgeon: [name], MD;  Location: [facility];  Service: Orthopedics;  Laterality: Right;    Family History  Problem Relation Age of Onset   Hyperlipidemia Mother    Hypertension Mother    Transient ischemic attack Mother    Hyperlipidemia Father    Hypertension Father    Lung cancer Father     Review of Systems  Constitutional:  Negative for chills and fatigue.  HENT:  Positive for tinnitus. Negative for congestion, ear pain, sinus pain and sore throat.   Respiratory:  Positive for cough. Negative for shortness of breath.   Cardiovascular:  Negative for chest pain and leg swelling.  Gastrointestinal:  Negative for abdominal pain, constipation, diarrhea, nausea and vomiting.  Genitourinary:  Positive for dysuria and urgency. Negative for frequency.  Musculoskeletal:  Negative for arthralgias, back pain and myalgias.  Neurological:  Negative for dizziness and  headaches.     Objective:  BP 90/80   Pulse 75   Temp (!) 97.2 F (36.2 C)   Resp 16   Ht '5\' 4"'$  (1.626 m)   Wt 150 lb 9.6 oz (68.3 kg)   SpO2 100%   BMI 25.85 kg/m      08/16/2022   10:33 AM 05/05/2022   12:01 PM 04/28/2022    7:35 AM  BP/Weight  Systolic BP 90 628 315  Diastolic BP 80 81 70  Wt. (Lbs) 150.6  151.4  BMI 25.85 kg/m2  25.99 kg/m2    Physical Exam Vitals reviewed.  Constitutional:      Appearance: Normal appearance. She is normal weight.  HENT:     Right Ear: Tympanic membrane normal.     Left Ear: Tympanic membrane normal.     Nose: Congestion present.  Comments: Sinus tenderness BL    Mouth/Throat:     Pharynx: Posterior oropharyngeal erythema present. No oropharyngeal exudate.  Neck:     Vascular: No carotid bruit.  Cardiovascular:     Rate and Rhythm: Normal rate and regular rhythm.     Heart sounds: Normal heart sounds.  Pulmonary:     Effort: Pulmonary effort is normal.     Breath sounds: Normal breath sounds.  Abdominal:     General: Abdomen is flat. Bowel sounds are normal.     Palpations: Abdomen is soft.     Tenderness: There is no abdominal tenderness.  Lymphadenopathy:     Cervical: No cervical adenopathy.  Neurological:     Mental Status: She is alert and oriented to person, place, and time.  Psychiatric:        Mood and Affect: Mood normal.        Behavior: Behavior normal.     Lab Results  Component Value Date   WBC 5.4 04/24/2022   HGB 14.0 04/24/2022   HCT 42.1 04/24/2022   PLT 400 04/24/2022   GLUCOSE 107 (H) 04/24/2022   CHOL 136 04/24/2022   TRIG 133 04/24/2022   HDL 60 04/24/2022   LDLCALC 53 04/24/2022   ALT 10 04/24/2022   AST 16 04/24/2022   NA 136 04/24/2022   K 4.1 04/24/2022   CL 95 (L) 04/24/2022   CREATININE 0.98 04/24/2022   BUN 20 04/24/2022   CO2 26 04/24/2022   INR 0.92 04/01/2013   HGBA1C 6.0 (H) 04/24/2022   MICROALBUR 10 06/29/2021      Assessment & Plan:   Problem List Items  Addressed This Visit       Cardiovascular and Mediastinum   Hypertension associated with diabetes ([place])    Decrease lisinopril 40 once daily at night.  Hold aspirin.      Relevant Medications   lisinopril (ZESTRIL) 40 MG tablet     Respiratory   Acute infection of nasal sinus    Prescription: cefdinir.      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       Relevant Orders   Urine Culture (Completed)     Other   Abnormal physical evaluation - Primary    Things to do to keep yourself healthy  - Exercise at least 30-45 minutes a day, 3-4 days a week.  - Eat a low-fat diet with lots of fruits and vegetables, up to 7-9 servings per day.  - Seatbelts can save your life. Wear them always.  - Smoke detectors on every level of your home, check batteries every year.  - Eye Doctor - have an eye exam every 1-2 years  - [place]. Choose someone to speak for you if you are not able. Please bring [place] a copy  - Depression is common in our stressful world.If you're feeling down or losing interest in things you normally enjoy, please come in for a visit.  - Violence - If anyone is threatening or hurting you, please call immediately.      RESOLVED: Dysuria   Relevant Orders   POCT URINALYSIS DIP (CLINITEK) (Completed)   Other Visit Diagnoses  Need for pneumococcal 20-valent conjugate vaccination       Relevant Orders   Pneumococcal conjugate vaccine 20-valent (Prevnar 20) (Completed)      Body mass index is 25.85 kg/m.   This is a list of the screening recommended for you and due dates:  Health Maintenance  Topic Date Due   HIV Screening  Never done   Hepatitis C Screening: USPSTF  Recommendation to screen - Ages 27-79 yo.  Never done   Eye exam for diabetics  03/10/2022   COVID-19 Vaccine (4 - 2023-24 season) 05/05/2022   DEXA scan (bone density measurement)  Never done   Hemoglobin A1C  10/25/2022   Mammogram  01/21/2023   Yearly kidney function blood test for diabetes  04/25/2023   Yearly kidney health urinalysis for diabetes  04/29/2023   Complete foot exam   04/29/2023   Cologuard (Stool DNA test)  05/03/2025   DTaP/Tdap/Td vaccine (2 - Td or Tdap) 05/11/2028   Pneumonia Vaccine  Completed   Flu Shot  Completed   Zoster (Shingles) Vaccine  Completed   HPV Vaccine  Aged Out     Meds ordered this encounter  Medications   cefdinir (OMNICEF) 300 MG capsule    Sig: Take 1 capsule (300 mg total) by mouth 2 (two) times daily.    Dispense:  20 capsule    Refill:  0   lisinopril (ZESTRIL) 40 MG tablet    Sig: Take 1 tablet (40 mg total) by mouth daily.    Dispense:  90 tablet    Refill:  0    Follow-up: Return in about 3 months (around 11/02/2022) for chronic follow up.  An After Visit Summary was printed and given to the patient.  Rochel Brome, MD [facility] [phone]

## 2022-08-17 ENCOUNTER — Telehealth: Payer: Self-pay

## 2022-08-17 NOTE — Telephone Encounter (Signed)
Lindsey [name] called requesting something for her cough.  The delsym is not working well.  Dr. Tobie Poet is going to send in prescription cough medication.  Patient was notified.

## 2022-08-18 ENCOUNTER — Other Ambulatory Visit: Payer: Self-pay | Admitting: Family Medicine

## 2022-08-18 LAB — URINE CULTURE

## 2022-08-20 ENCOUNTER — Encounter: Payer: Self-pay | Admitting: Family Medicine

## 2022-08-20 MED ORDER — LISINOPRIL 40 MG PO TABS: 40.0000 mg | ORAL_TABLET | Freq: Every day | ORAL | 0 refills | Status: DC

## 2022-08-20 NOTE — Assessment & Plan Note (Signed)
Change cipro to cefdinir to cover sinusitis.

## 2022-08-20 NOTE — Assessment & Plan Note (Signed)
Things to do to keep yourself healthy  - Exercise at least 30-45 minutes a day, 3-4 days a week.  - Eat a low-fat diet with lots of fruits and vegetables, up to 7-9 servings per day.  - Seatbelts can save your life. Wear them always.  - Smoke detectors on every level of your home, check batteries every year.  - Eye Doctor - have an eye exam every 1-2 years  - [place]. Choose someone to speak for you if you are not able. Please bring [place] a copy  - Depression is common in our stressful world.If you're feeling down or losing interest in things you normally enjoy, please come in for a visit.  - Violence - If anyone is threatening or hurting you, please call immediately.

## 2022-08-20 NOTE — Assessment & Plan Note (Signed)
Decrease lisinopril 40 once daily at night.  Hold aspirin.

## 2022-08-20 NOTE — Assessment & Plan Note (Signed)
Prescription: cefdinir.

## 2022-08-21 ENCOUNTER — Other Ambulatory Visit: Payer: Self-pay | Admitting: Family Medicine

## 2022-08-21 MED ORDER — PROMETHAZINE-CODEINE 6.25-10 MG/5ML PO SOLN: 5.0000 mL | Freq: Two times a day (BID) | ORAL | 0 refills | Status: DC

## 2022-08-21 NOTE — Telephone Encounter (Signed)
Patient call and stated cough medication was suppose to be called in for her Thursday to [facility] but she never got it. She has not been able to get any rest due to cough and has try OTC medication.  Patient has talk to [place].

## 2022-08-22 ENCOUNTER — Other Ambulatory Visit: Payer: Self-pay | Admitting: Family Medicine

## 2022-08-22 ENCOUNTER — Telehealth: Payer: Self-pay

## 2022-08-22 MED ORDER — PROMETHAZINE-DM 6.25-15 MG/5ML PO SYRP: 5.0000 mL | ORAL_SOLUTION | Freq: Four times a day (QID) | ORAL | 0 refills | Status: DC | PRN

## 2022-08-22 NOTE — Telephone Encounter (Signed)
[facility] called and stated that they do not have the Promethazine with Codeine and wanted to know if there is another medication you prefer.  Please advise

## 2022-08-26 ENCOUNTER — Other Ambulatory Visit: Payer: Self-pay | Admitting: Family Medicine

## 2022-09-10 ENCOUNTER — Other Ambulatory Visit: Payer: Self-pay | Admitting: Family Medicine

## 2022-09-26 ENCOUNTER — Other Ambulatory Visit: Payer: Self-pay | Admitting: Family Medicine

## 2022-10-18 ENCOUNTER — Other Ambulatory Visit: Payer: Self-pay | Admitting: Family Medicine

## 2022-10-23 ENCOUNTER — Other Ambulatory Visit: Payer: Self-pay | Admitting: Family Medicine

## 2022-10-24 NOTE — Telephone Encounter (Signed)
Did you want this filled #90 with 3 refills (again)

## 2022-10-29 ENCOUNTER — Other Ambulatory Visit: Payer: Self-pay

## 2022-10-29 DIAGNOSIS — I152 Hypertension secondary to endocrine disorders: Secondary | ICD-10-CM

## 2022-10-29 DIAGNOSIS — E1121 Type 2 diabetes mellitus with diabetic nephropathy: Secondary | ICD-10-CM

## 2022-10-29 DIAGNOSIS — E782 Mixed hyperlipidemia: Secondary | ICD-10-CM

## 2022-10-29 DIAGNOSIS — E1159 Type 2 diabetes mellitus with other circulatory complications: Secondary | ICD-10-CM

## 2022-10-30 ENCOUNTER — Other Ambulatory Visit: Payer: BC Managed Care – PPO

## 2022-10-30 DIAGNOSIS — E1121 Type 2 diabetes mellitus with diabetic nephropathy: Secondary | ICD-10-CM | POA: Diagnosis not present

## 2022-10-30 DIAGNOSIS — E1159 Type 2 diabetes mellitus with other circulatory complications: Secondary | ICD-10-CM | POA: Diagnosis not present

## 2022-10-30 DIAGNOSIS — I152 Hypertension secondary to endocrine disorders: Secondary | ICD-10-CM | POA: Diagnosis not present

## 2022-10-30 DIAGNOSIS — E782 Mixed hyperlipidemia: Secondary | ICD-10-CM

## 2022-10-31 LAB — MICROALBUMIN / CREATININE URINE RATIO
Creatinine, Urine: 134.5 mg/dL
Microalb/Creat Ratio: 6 mg/g creat (ref 0–29)
Microalbumin, Urine: 8.6 ug/mL

## 2022-10-31 LAB — CBC WITH DIFFERENTIAL/PLATELET
Basophils Absolute: 0.1 10*3/uL (ref 0.0–0.2)
Basos: 1 %
EOS (ABSOLUTE): 0.2 10*3/uL (ref 0.0–0.4)
Eos: 5 %
Hematocrit: 42.6 % (ref 34.0–46.6)
Hemoglobin: 14.1 g/dL (ref 11.1–15.9)
Immature Grans (Abs): 0 10*3/uL (ref 0.0–0.1)
Immature Granulocytes: 0 %
Lymphocytes Absolute: 1.4 10*3/uL (ref 0.7–3.1)
Lymphs: 28 %
MCH: 27.4 pg (ref 26.6–33.0)
MCHC: 33.1 g/dL (ref 31.5–35.7)
MCV: 83 fL (ref 79–97)
Monocytes Absolute: 0.5 10*3/uL (ref 0.1–0.9)
Monocytes: 9 %
Neutrophils Absolute: 3 10*3/uL (ref 1.4–7.0)
Neutrophils: 57 %
Platelets: 426 10*3/uL (ref 150–450)
RBC: 5.15 x10E6/uL (ref 3.77–5.28)
RDW: 13.6 % (ref 11.7–15.4)
WBC: 5.2 10*3/uL (ref 3.4–10.8)

## 2022-10-31 LAB — LIPID PANEL
Chol/HDL Ratio: 2.2 ratio (ref 0.0–4.4)
Cholesterol, Total: 130 mg/dL (ref 100–199)
HDL: 60 mg/dL
LDL Chol Calc (NIH): 50 mg/dL (ref 0–99)
Triglycerides: 113 mg/dL (ref 0–149)
VLDL Cholesterol Cal: 20 mg/dL (ref 5–40)

## 2022-10-31 LAB — COMPREHENSIVE METABOLIC PANEL
ALT: 14 IU/L (ref 0–32)
AST: 19 IU/L (ref 0–40)
Albumin/Globulin Ratio: 1.7 (ref 1.2–2.2)
Albumin: 4.4 g/dL (ref 3.9–4.9)
Alkaline Phosphatase: 69 IU/L (ref 44–121)
BUN/Creatinine Ratio: 18 (ref 12–28)
BUN: 19 mg/dL (ref 8–27)
Bilirubin Total: 0.5 mg/dL (ref 0.0–1.2)
CO2: 23 mmol/L (ref 20–29)
Calcium: 10.5 mg/dL — ABNORMAL HIGH (ref 8.7–10.3)
Chloride: 96 mmol/L (ref 96–106)
Creatinine, Ser: 1.08 mg/dL — ABNORMAL HIGH (ref 0.57–1.00)
Globulin, Total: 2.6 g/dL (ref 1.5–4.5)
Glucose: 97 mg/dL (ref 70–99)
Potassium: 4.3 mmol/L (ref 3.5–5.2)
Sodium: 137 mmol/L (ref 134–144)
Total Protein: 7 g/dL (ref 6.0–8.5)
eGFR: 57 mL/min/{1.73_m2} — ABNORMAL LOW (ref >59)

## 2022-10-31 LAB — HEMOGLOBIN A1C
Est. average glucose Bld gHb Est-mCnc: 131 mg/dL
Hgb A1c MFr Bld: 6.2 % — ABNORMAL HIGH (ref 4.8–5.6)

## 2022-10-31 LAB — CARDIOVASCULAR RISK ASSESSMENT

## 2022-11-01 NOTE — Assessment & Plan Note (Signed)
Well controlled. Recommend check sugars fasting daily. Recommend check feet daily. Recommend annual eye exams. Medicines: Synjardy XR 12.01/999 mg 2 daily and ozempic. Ozempic 0.5 mg weekly and aspirin 81 mg daily. Continue to work on eating a healthy diet and exercise.

## 2022-11-01 NOTE — Assessment & Plan Note (Signed)
The current medical regimen is effective;  continue present plan and medications. Continue omeprazole 20 mg once daily.

## 2022-11-01 NOTE — Assessment & Plan Note (Signed)
Continue on lisinopril 40 once daily at night and aspirin 81 mg daily.

## 2022-11-01 NOTE — Assessment & Plan Note (Signed)
Well controlled.  No changes to medicines. Continue pravastatin 10 mg daily and lovaza 1 gm 2 capsules twice daily.  Continue to work on eating a healthy diet and exercise.  Labs reviewed

## 2022-11-01 NOTE — Progress Notes (Unsigned)
Subjective:  Patient ID: Lindsey [name], female    DOB: [birth year]  Age: 67 y.o. MRN: [id number]  Chief Complaint  Patient presents with   Diabetes   Hypertension   HPI Diabetes:  Complications: glomerulopathy Glucose checking: daily. Hypoglycemia: no  Most recent A1C: 6.0 Current medications: Synjardy XR 12.01/999 mg 2 daily Ozempic 0.5 mg weekly and aspirin 81 mg daily. Last Eye Exam: Up-to-date Foot checks: Daily  Hyperlipidemia: Current medications: pravachol 10 mg before bed.  Lovaza 1 g 2 capsules twice daily.  Migraines: Using Imitrex sporadically. Fairly well controlled.   Hypertension: Current medications: Lisinopril 40 mg twice daily  Insomnia: Well-controlled with zolpidem 12.5 mg CR 1 p.o. nightly.  Diet: Healthy. No exercise.      04/28/2022    7:39 AM 08/20/2021    8:30 PM 01/28/2021   11:12 AM 10/28/2020    1:35 PM 08/04/2020    8:11 AM  Depression screen PHQ 2/9  Decreased Interest 0 0 0 0 0  Down, Depressed, Hopeless 0 0 0 1 0  PHQ - 2 Score 0 0 0 1 0  Altered sleeping   0 0   Tired, decreased energy   1 0   Change in appetite   0 0   Feeling bad or failure about yourself    0 0   Trouble concentrating   0 0   Moving slowly or fidgety/restless   0 0   Suicidal thoughts   0 0   PHQ-9 Score   1 1   Difficult doing work/chores   Not difficult at all Not difficult at all          08/04/2020    8:12 AM 10/28/2020    1:35 PM 01/28/2021   11:13 AM 11/02/2022    7:31 AM  [name] in the past year? 0 0 0 0  Was there an injury with Fall? 0 0 0 0  Fall Risk Category Calculator 0 0 0 0  Fall Risk Category (Retired) Low Low Low   (RETIRED) Patient Fall Risk Level Low fall risk Low fall risk Low fall risk   Patient at Risk for Falls Due to  No Fall Risks No Fall Risks No Fall Risks  Fall risk Follow up Falls evaluation completed Falls evaluation completed Falls evaluation completed Falls evaluation completed      Review of Systems   Constitutional:  Negative for chills, fatigue and fever.  HENT:  Negative for congestion, rhinorrhea and sore throat.   Respiratory:  Negative for cough and shortness of breath.   Cardiovascular:  Negative for chest pain.  Gastrointestinal:  Negative for abdominal pain, constipation, diarrhea, nausea and vomiting.  Genitourinary:  Negative for dysuria and urgency.  Musculoskeletal:  Negative for back pain and myalgias.  Neurological:  Negative for dizziness, weakness, light-headedness and headaches.  Psychiatric/Behavioral:  Negative for dysphoric mood. The patient is not nervous/anxious.     Current Outpatient Medications on File Prior to Visit  Medication Sig Dispense Refill   aspirin EC 81 MG tablet Take 81 mg by mouth every morning.     Calcium Carb-Cholecalciferol (CALCIUM 600 + D PO) Take 1 tablet by mouth at bedtime.     glucose blood (CONTOUR NEXT TEST) test strip Use as instructed 100 each 3   Lancet Devices (MICROLET NEXT LANCING DEVICE) MISC 1 each by Does not apply route in the morning. 1 each 0   lisinopril (ZESTRIL) 40 MG tablet Take 1 tablet (40  mg total) by mouth daily. 90 tablet 0   LORazepam (ATIVAN) 0.5 MG tablet TAKE 1 TABLET BY MOUTH ONCE DAILY AS NEEDED FOR ANXIETY. 30 tablet 0   Magnesium 250 MG TABS Take by mouth in the morning and at bedtime.     omega-3 acid ethyl esters (LOVAZA) 1 g capsule TAKE 2 CAPSULES BY MOUTH  TWICE DAILY 360 capsule 3   omeprazole (PRILOSEC) 20 MG capsule TAKE 1 CAPSULE BY MOUTH ONCE  DAILY BEFORE A MEAL 90 capsule 0   pravastatin (PRAVACHOL) 10 MG tablet TAKE 1 TABLET BY MOUTH ONCE  DAILY 90 tablet 3   Probiotic Product (PROBIOTIC BLEND PO) Take by mouth daily.     Semaglutide,0.25 or 0.'5MG'$ /DOS, (OZEMPIC, 0.25 OR 0.5 MG/DOSE,) 2 MG/3ML SOPN INJECT 0.5 MG DOSE INTO THE SKIN ONCE A WEEK. 3 mL 1   SUMAtriptan (IMITREX) 100 MG tablet TAKE 1 TABLET BY MOUTH AT ONSET  OF MIGRAINE MAY REPEAT IN 2  HOURS AS NEEDED . MAX OF 2  TABLETS DAILY 27  tablet 1   SYNJARDY XR 12.01-999 MG TB24 TAKE 2 TABLETS BY MOUTH DAILY 180 tablet 3   zolpidem (AMBIEN CR) 12.5 MG CR tablet TAKE 1 TABLET BY MOUTH AT BEDTIME 90 tablet 1   No current facility-administered medications on file prior to visit.   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 Rochel Brome, [name] 12/19/21 in Epic as of 03/15/22.   Dyslipidemia associated with type 2 diabetes mellitus [facility])    Patient follows with Dr. Rochel Brome, [place] 12/19/21 in Epic as of 03/15/22.   Frequent UTI     h/o since hysterectomy/04/01/13 asymptomatic   GERD (gastroesophageal reflux disease)    Takes Omeprazole daily as of 03/15/22.   Headache(784.0)    migraines   Hypertension    Currently on Lisinopril. Follows w/ PCP Dr. Rochel Brome, [place] 12/19/21 as of 03/15/22.   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 Overview:  The patient in 09/2009 underwent TAHBSO with the findings of a ruptured 10 cm right ovarian mass. 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   Past Surgical History:  Procedure Laterality Date   ABDOMINAL HYSTERECTOMY  2011   Stromal ovarian cancer   ANTERIOR AND POSTERIOR REPAIR WITH SACROSPINOUS FIXATION N/A 03/27/2022   Procedure: ANTERIOR REPAIR WITH SACROSPINOUS FIXATION;  Surgeon: [name], MD;  Location: [facility];  Service: Gynecology;  Laterality: N/A;   CHOLECYSTECTOMY  1989   COLONOSCOPY  2010   no polyps per pt   CYSTOSCOPY N/A 03/27/2022   Procedure: CYSTOSCOPY;  Surgeon: [name], MD;  Location: [facility];  Service: Gynecology;  Laterality: N/A;   ENTEROCELE REPAIR      around 2007   KNEE ARTHROSCOPY Right    many yrs ago before knee replacemnt   KNEE ARTHROSCOPY Right 07/01/2014   Procedure: RIGHT ARTHROSCOPY KNEE WITH SYNOVECTOMY;  Surgeon: [name], MD;  Location: [facility];  Service: Orthopedics;  Laterality: Right;   RECTOCELE REPAIR     around 2007   [place]  TOTAL KNEE ARTHROPLASTY Right 04/07/2013   Procedure: RIGHT TOTAL KNEE ARTHROPLASTY;  Surgeon: [name], MD;  Location: [facility];  Service: Orthopedics;  Laterality: Right;    Family History  Problem Relation Age of Onset   Hyperlipidemia Mother    Hypertension Mother    Transient ischemic attack Mother    Hyperlipidemia Father    Hypertension Father    Lung cancer Father    Social History   Socioeconomic History   Marital status: Married    Spouse name: Not on file   Number of children: Not on file   Years of education: Not on file   Highest education level: Not on file  Occupational History   Not on file  Tobacco Use   Smoking status: Never   Smokeless tobacco: Never  Vaping Use   Vaping Use: Never used  Substance and Sexual Activity   Alcohol use: No   Drug use: No   Sexual activity: Yes    Birth control/protection: Surgical    Comment: hysterectomy  Other Topics Concern   Not on file  Social History Narrative   Not on file   Social Determinants of Health   Financial Resource Strain: Not on file  Food Insecurity: Not on file  Transportation Needs: Not on file  Physical Activity: Inactive (08/08/2021)   Exercise Vital Sign    Days of Exercise per Week: 0 days    Minutes of Exercise per Session: 0 min  Stress: No Stress Concern Present (08/08/2021)   [place]    Feeling of Stress : Not at all  Social Connections: Moderately Integrated (08/08/2021)   Social Connection and Isolation Panel [NHANES]    Frequency of Communication with Friends and Family: More than three times  a week    Frequency of Social Gatherings with Friends and Family: Once a week    Attends Religious Services: More than 4 times per year    Active Member of [employer] or Organizations: No    Attends Music therapist: Never    Marital Status: Married    Objective:  BP 106/68   Pulse 72   Temp (!) 95.6 F (35.3 C)   Resp 14   Ht '5\' 4"'$  (1.626 m)   Wt 153 lb 9.6 oz (69.7 kg)   BMI 26.37 kg/m      11/02/2022    7:24 AM 08/16/2022   10:33 AM 05/05/2022   12:01 PM  BP/Weight  Systolic BP A999333 [id number]  Diastolic BP 68 80 81  Wt. (Lbs) 153.6 150.6   BMI 26.37 kg/m2 25.85 kg/m2     Physical Exam Vitals reviewed.  Constitutional:      Appearance: Normal appearance. She is normal weight.  Neck:     Vascular: No carotid bruit.  Cardiovascular:     Rate and Rhythm: Normal rate and regular rhythm.     Heart sounds: Normal heart sounds.  Pulmonary:     Effort: Pulmonary effort is normal. No respiratory distress.     Breath sounds: Normal breath sounds.  Abdominal:     General: Abdomen is flat. Bowel sounds are normal.     Palpations: Abdomen is soft.     Tenderness: There is no abdominal tenderness.  Neurological:     Mental Status: She is alert and oriented to person, place, and time.  Psychiatric:        Mood and Affect: Mood normal.  Behavior: Behavior normal.     Diabetic Foot Exam - Simple   Simple Foot Form Diabetic Foot exam was performed with the following findings: Yes 11/02/2022  7:45 AM  Visual Inspection No deformities, no ulcerations, no other skin breakdown bilaterally: Yes Sensation Testing Intact to touch and monofilament testing bilaterally: Yes Pulse Check Posterior Tibialis and Dorsalis pulse intact bilaterally: Yes Comments      Lab Results  Component Value Date   WBC 5.2 10/30/2022   HGB 14.1 10/30/2022   HCT 42.6 10/30/2022   PLT 426 10/30/2022   GLUCOSE 97 10/30/2022   CHOL 130 10/30/2022   TRIG 113 10/30/2022   HDL 60  10/30/2022   LDLCALC 50 10/30/2022   ALT 14 10/30/2022   AST 19 10/30/2022   NA 137 10/30/2022   K 4.3 10/30/2022   CL 96 10/30/2022   CREATININE 1.08 (H) 10/30/2022   BUN 19 10/30/2022   CO2 23 10/30/2022   INR 0.92 04/01/2013   HGBA1C 6.2 (H) 10/30/2022   MICROALBUR 10 06/29/2021      Assessment & Plan:    Hypertension associated with diabetes ([place]) Assessment & Plan: Continue on lisinopril 40 once daily at night and aspirin 81 mg daily.   Gastroesophageal reflux disease without esophagitis Assessment & Plan: The current medical regimen is effective;  continue present plan and medications. Continue omeprazole 20 mg once daily.    Mixed hyperlipidemia Assessment & Plan: Well controlled.  No changes to medicines. Continue pravastatin 10 mg daily and lovaza 1 gm 2 capsules twice daily.  Continue to work on eating a healthy diet and exercise.  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) Assessment & Plan: Well controlled. Recommend check sugars fasting daily. Recommend check feet daily. Recommend annual eye exams. Medicines: Synjardy XR 12.01/999 mg 2 daily and ozempic. Ozempic 0.5 mg weekly and aspirin 81 mg daily. Continue to work on eating a healthy diet and exercise.     Orders: -     Microalbumin / creatinine urine ratio  Encounter for screening mammogram for malignant neoplasm of breast -     Digital Screening Mammogram, Left and Right; Future     No orders of the defined types were placed in this encounter.   Orders Placed This Encounter  Procedures   MM Digital Screening   Microalbumin / creatinine urine ratio     Follow-up: Return in about 6 months (around 05/03/2023) for chronic follow up.   I,Marla I Leal-Borjas,acting as a scribe for Rochel Brome, MD.,have documented all relevant documentation on the behalf of Rochel Brome, MD,as directed by  Rochel Brome, MD while in the presence of Rochel Brome, MD.   An After Visit Summary was printed  and given to the patient.  I attest that I have reviewed this visit and agree with the plan scribed by my staff.   Rochel Brome, MD [facility] [phone]

## 2022-11-02 ENCOUNTER — Ambulatory Visit: Payer: BC Managed Care – PPO | Admitting: Family Medicine

## 2022-11-02 ENCOUNTER — Encounter: Payer: Self-pay | Admitting: Family Medicine

## 2022-11-02 VITALS — BP 106/68 | HR 72 | Temp 95.6°F | Resp 14 | Ht 64.0 in | Wt 153.6 lb

## 2022-11-02 DIAGNOSIS — E1121 Type 2 diabetes mellitus with diabetic nephropathy: Secondary | ICD-10-CM

## 2022-11-02 DIAGNOSIS — K219 Gastro-esophageal reflux disease without esophagitis: Secondary | ICD-10-CM | POA: Diagnosis not present

## 2022-11-02 DIAGNOSIS — Z1231 Encounter for screening mammogram for malignant neoplasm of breast: Secondary | ICD-10-CM

## 2022-11-02 DIAGNOSIS — E1159 Type 2 diabetes mellitus with other circulatory complications: Secondary | ICD-10-CM

## 2022-11-02 DIAGNOSIS — I152 Hypertension secondary to endocrine disorders: Secondary | ICD-10-CM

## 2022-11-02 DIAGNOSIS — E782 Mixed hyperlipidemia: Secondary | ICD-10-CM

## 2022-11-02 NOTE — Patient Instructions (Signed)
Decrease lisinopril 40 mg daily.

## 2022-11-03 ENCOUNTER — Other Ambulatory Visit: Payer: Self-pay | Admitting: Family Medicine

## 2022-11-08 ENCOUNTER — Ambulatory Visit
Admission: RE | Admit: 2022-11-08 | Discharge: 2022-11-08 | Disposition: A | Discharge status: Home / Self Care | Payer: BC Managed Care – PPO | Source: Ambulatory Visit | Attending: Family Medicine | Admitting: Family Medicine

## 2022-11-08 DIAGNOSIS — Z1231 Encounter for screening mammogram for malignant neoplasm of breast: Secondary | ICD-10-CM | POA: Diagnosis not present

## 2022-12-09 ENCOUNTER — Other Ambulatory Visit: Payer: Self-pay | Admitting: Family Medicine

## 2022-12-21 ENCOUNTER — Other Ambulatory Visit: Payer: Self-pay | Admitting: Family Medicine

## 2023-01-25 ENCOUNTER — Other Ambulatory Visit: Payer: Self-pay | Admitting: Family Medicine

## 2023-02-01 ENCOUNTER — Other Ambulatory Visit: Payer: Self-pay | Admitting: Family Medicine

## 2023-02-01 ENCOUNTER — Other Ambulatory Visit: Payer: Self-pay | Admitting: Physician Assistant

## 2023-02-01 DIAGNOSIS — E1121 Type 2 diabetes mellitus with diabetic nephropathy: Secondary | ICD-10-CM

## 2023-03-21 DIAGNOSIS — E7800 Pure hypercholesterolemia, unspecified: Secondary | ICD-10-CM | POA: Diagnosis not present

## 2023-03-21 DIAGNOSIS — R519 Headache, unspecified: Secondary | ICD-10-CM | POA: Diagnosis not present

## 2023-03-21 DIAGNOSIS — I10 Essential (primary) hypertension: Secondary | ICD-10-CM | POA: Diagnosis not present

## 2023-03-26 ENCOUNTER — Other Ambulatory Visit: Payer: Self-pay | Admitting: Family Medicine

## 2023-04-25 ENCOUNTER — Encounter: Payer: Self-pay | Admitting: Family Medicine

## 2023-04-26 ENCOUNTER — Other Ambulatory Visit: Payer: Self-pay | Admitting: Family Medicine

## 2023-04-26 DIAGNOSIS — Z8543 Personal history of malignant neoplasm of ovary: Secondary | ICD-10-CM | POA: Insufficient documentation

## 2023-04-26 DIAGNOSIS — E1121 Type 2 diabetes mellitus with diabetic nephropathy: Secondary | ICD-10-CM

## 2023-04-26 DIAGNOSIS — I152 Hypertension secondary to endocrine disorders: Secondary | ICD-10-CM

## 2023-04-26 DIAGNOSIS — E782 Mixed hyperlipidemia: Secondary | ICD-10-CM

## 2023-04-26 NOTE — Telephone Encounter (Signed)
Done. Dr. Cox  

## 2023-04-28 ENCOUNTER — Other Ambulatory Visit: Payer: Self-pay | Admitting: Family Medicine

## 2023-04-28 DIAGNOSIS — E1121 Type 2 diabetes mellitus with diabetic nephropathy: Secondary | ICD-10-CM

## 2023-04-29 DIAGNOSIS — N3091 Cystitis, unspecified with hematuria: Secondary | ICD-10-CM | POA: Diagnosis not present

## 2023-04-29 DIAGNOSIS — N3001 Acute cystitis with hematuria: Secondary | ICD-10-CM | POA: Diagnosis not present

## 2023-05-01 ENCOUNTER — Other Ambulatory Visit: Payer: BC Managed Care – PPO

## 2023-05-01 DIAGNOSIS — E782 Mixed hyperlipidemia: Secondary | ICD-10-CM | POA: Diagnosis not present

## 2023-05-01 DIAGNOSIS — E1159 Type 2 diabetes mellitus with other circulatory complications: Secondary | ICD-10-CM

## 2023-05-01 DIAGNOSIS — E1121 Type 2 diabetes mellitus with diabetic nephropathy: Secondary | ICD-10-CM | POA: Diagnosis not present

## 2023-05-01 DIAGNOSIS — I152 Hypertension secondary to endocrine disorders: Secondary | ICD-10-CM | POA: Diagnosis not present

## 2023-05-01 DIAGNOSIS — Z8543 Personal history of malignant neoplasm of ovary: Secondary | ICD-10-CM

## 2023-05-01 LAB — POCT URINALYSIS DIP (CLINITEK)
Bilirubin, UA: NEGATIVE
Blood, UA: NEGATIVE
Glucose, UA: >=1000 mg/dL — AB
Leukocytes, UA: NEGATIVE
Nitrite, UA: NEGATIVE
POC PROTEIN,UA: NEGATIVE
Spec Grav, UA: 1.015 (ref 1.010–1.025)
Urobilinogen, UA: 0.2 E.U./dL
pH, UA: 6 (ref 5.0–8.0)

## 2023-05-02 NOTE — Assessment & Plan Note (Signed)
The current medical regimen is effective;  continue present plan and medications. Continue omeprazole 20 mg once daily.  

## 2023-05-02 NOTE — Assessment & Plan Note (Deleted)
Continue on lisinopril 40 once daily at night and aspirin 81 mg daily.

## 2023-05-02 NOTE — Assessment & Plan Note (Signed)
Well controlled. Recommend check sugars fasting daily. Recommend check feet daily. Recommend annual eye exams. Medicines: Synjardy XR 12.01/999 mg 2 daily and ozempic. Ozempic 0.5 mg weekly and aspirin 81 mg daily. Continue to work on eating a healthy diet and exercise.    

## 2023-05-02 NOTE — Assessment & Plan Note (Signed)
Well controlled.  No changes to medicines. Continue pravastatin 10 mg daily and lovaza 1 gm 2 capsules twice daily.  Continue to work on eating a healthy diet and exercise.  Labs reviewed

## 2023-05-02 NOTE — Progress Notes (Signed)
Subjective:  Patient ID: Tommi Rumps, female    DOB: [birth year]  Age: 67 y.o. MRN: [id number]  Chief Complaint  Patient presents with   Medical Management of Chronic Issues   HPI   Diabetes:  Complications: glomerulopathy Glucose checking: daily. Hypoglycemia: no  Most recent A1C: 6.2 Current medications: Synjardy XR 12.01/999 mg 2 daily Ozempic 0.5 mg weekly and aspirin 81 mg daily. Last Eye Exam: Up-to-date Foot checks: Daily-  Hyperlipidemia: Current medications: pravachol 10 mg before bed.  Lovaza 1 g 2 capsules twice daily.  Migraines: Using Imitrex sporadically.  Much worse over the last 3 weeks.  Patient's been having daily headaches.  She went to the emergency department 1 day after vomiting at least 9 times due to her headaches.  She is waking up with headaches.  At the ED they did do a CT scan of her brain which was normal.  Previously she had been well-controlled on Aimovig.  She ended up coming off of this for insurance reasons but her migraines do not return.  She also been on a calcium channel blocker in the past.  She has not been on Topamax in the past.  Imitrex does help some.  She has an old prescription for Compazine which helps with her nausea but makes her drowsy.  Hypertension: Current medications: Lisinopril 40 mg twice daily.  BP has running around 160/100 and she has been waking up with headaches.  Previously had been on HCTZ and calcium channel blockers.  Patient had lost weight was eating healthy and exercising and we were able to wean some of her blood pressure medicines off. Insomnia: Well-controlled with zolpidem 12.5 mg CR 1 p.o. nightly.  Diet: Fairly healthy. No exercise.   Patient has had significant increased anxiety over the summer.  Her mother said surgery as well as her husband.  She has been working a lot.  She does have an upcoming vacation with her husband.  She is also had significantly increased migraines. Previously has tried Zoloft and  Lexapro and had side effects.  She does have some lorazepam which she uses at times.  In addition she seen the dentist and was diagnosed with a dental abscess.  She is on clindamycin.  She is supposed to go back to the dentist for crown when she returns from her vacation.  This is also stressing her out.     05/03/2023    7:27 AM 04/28/2022    7:39 AM 08/20/2021    8:30 PM 01/28/2021   11:12 AM 10/28/2020    1:35 PM  Depression screen PHQ 2/9  Decreased Interest 0 0 0 0 0  Down, Depressed, Hopeless 0 0 0 0 1  PHQ - 2 Score 0 0 0 0 1  Altered sleeping    0 0  Tired, decreased energy    1 0  Change in appetite    0 0  Feeling bad or failure about yourself     0 0  Trouble concentrating    0 0  Moving slowly or fidgety/restless    0 0  Suicidal thoughts    0 0  PHQ-9 Score    1 1  Difficult doing work/chores    Not difficult at all Not difficult at all        05/03/2023    7:27 AM 04/28/2022    7:39 AM 10/28/2020    1:58 PM  GAD 7 : Generalized Anxiety Score  Nervous, Anxious, on Edge 0 0  1  Control/stop worrying 0 0 1  Worry too much - different things 0 0 0  Trouble relaxing 0 0 1  Restless 0 0 0  Easily annoyed or irritable 0 0 0  Afraid - awful might happen 0 0 1  Total GAD 7 Score 0 0 4  Anxiety Difficulty Not difficult at all Not difficult at all          05/03/2023    7:27 AM  Fall Risk   Falls in the past year? 0  Number falls in past yr: 0  Injury with Fall? 0  Risk for fall due to : No Fall Risks  Follow up Falls evaluation completed;Falls prevention discussed    Patient Care Team: CoxFritzi Mandes, MD as PCP - General (Family Medicine)   Review of Systems  Constitutional:  Positive for fatigue. Negative for chills and fever.  HENT:  Negative for congestion, ear pain and sore throat.   Respiratory:  Negative for cough and shortness of breath.   Cardiovascular:  Negative for chest pain.  Gastrointestinal:  Negative for abdominal pain, constipation, diarrhea,  nausea and vomiting.  Genitourinary:  Negative for dysuria and urgency.  Musculoskeletal:  Negative for arthralgias and myalgias.  Skin:  Negative for rash.  Neurological:  Positive for headaches. Negative for dizziness.  Psychiatric/Behavioral:  Positive for sleep disturbance. Negative for agitation, dysphoric mood and suicidal ideas. The patient is nervous/anxious.     Current Outpatient Medications on File Prior to Visit  Medication Sig Dispense Refill   clindamycin (CLEOCIN) 150 MG capsule Take 150 mg by mouth 4 (four) times daily.     aspirin EC 81 MG tablet Take 81 mg by mouth every morning.     Calcium Carb-Cholecalciferol (CALCIUM 600 + D PO) Take 1 tablet by mouth at bedtime.     glucose blood (CONTOUR NEXT TEST) test strip Use as instructed 100 each 3   Lancet Devices (MICROLET NEXT LANCING DEVICE) MISC 1 each by Does not apply route in the morning. 1 each 0   lisinopril (ZESTRIL) 40 MG tablet TAKE 1 TABLET BY MOUTH TWICE DAILY IN THE MORNING AND AT BEDTIME. 180 tablet 0   Magnesium 250 MG TABS Take by mouth in the morning and at bedtime.     omega-3 acid ethyl esters (LOVAZA) 1 g capsule TAKE 2 CAPSULES BY MOUTH TWICE  DAILY 360 capsule 3   omeprazole (PRILOSEC) 20 MG capsule TAKE 1 CAPSULE BY MOUTH ONCE  DAILY BEFORE A MEAL 90 capsule 3   pravastatin (PRAVACHOL) 10 MG tablet TAKE 1 TABLET BY MOUTH ONCE  DAILY 90 tablet 3   Probiotic Product (PROBIOTIC BLEND PO) Take by mouth daily.     Semaglutide,0.25 or 0.5MG /DOS, (OZEMPIC, 0.25 OR 0.5 MG/DOSE,) 2 MG/3ML SOPN INJECT 0.5 MG DOSE INTO THE SKIN ONCE A WEEK. 3 mL 1   SUMAtriptan (IMITREX) 100 MG tablet TAKE 1 TABLET BY MOUTH AT ONSET  OF MIGRAINE MAY REPEAT IN 2  HOURS AS NEEDED . MAX OF 2  TABLETS BY MOUTH DAILY 27 tablet 1   SYNJARDY XR 12.01-999 MG TB24 TAKE 2 TABLETS BY MOUTH DAILY 180 tablet 3   zolpidem (AMBIEN CR) 12.5 MG CR tablet TAKE 1 TABLET BY MOUTH AT  BEDTIME 90 tablet 1   No current facility-administered medications  on file prior to visit.   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 Blane Ohara, LOV 12/19/21 in Epic as of 03/15/22.   Dyslipidemia associated with type 2 diabetes mellitus [facility])    Patient follows with Dr. Blane Ohara, LOV 12/19/21 in Epic as of 03/15/22.   Frequent UTI     h/o since hysterectomy/04/01/13 asymptomatic   GERD (gastroesophageal reflux disease)    Takes Omeprazole daily as of 03/15/22.   Headache(784.0)    migraines   Hypertension    Currently on Lisinopril. Follows w/ PCP Dr. Blane Ohara, Theron Arista 12/19/21 as of 03/15/22.   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 Overview:  The patient in 09/2009 underwent TAHBSO with the findings of a ruptured 10 cm right ovarian mass. 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   Past Surgical History:  Procedure Laterality Date   ABDOMINAL HYSTERECTOMY  2011   Stromal ovarian cancer   ANTERIOR AND POSTERIOR REPAIR WITH SACROSPINOUS FIXATION N/A 03/27/2022   Procedure: ANTERIOR REPAIR WITH SACROSPINOUS FIXATION;  Surgeon: Marguerita Beards, MD;  Location: [facility];  Service: Gynecology;  Laterality: N/A;   CHOLECYSTECTOMY  1989   COLONOSCOPY  2010   no polyps per pt   CYSTOSCOPY N/A 03/27/2022   Procedure: CYSTOSCOPY;  Surgeon: Marguerita Beards, MD;  Location: [facility];  Service: Gynecology;  Laterality: N/A;   ENTEROCELE REPAIR     around 2007   KNEE ARTHROSCOPY Right    many yrs ago before knee replacemnt   KNEE ARTHROSCOPY Right 07/01/2014   Procedure: RIGHT ARTHROSCOPY KNEE WITH SYNOVECTOMY;  Surgeon: Loanne Drilling, MD;  Location: [facility];  Service: Orthopedics;  Laterality: Right;   RECTOCELE REPAIR     around 2007   TONSILLECTOMY  1975   TOTAL KNEE ARTHROPLASTY Right 04/07/2013   Procedure: RIGHT TOTAL KNEE ARTHROPLASTY;  Surgeon: Loanne Drilling, MD;  Location: [facility];  Service: Orthopedics;  Laterality: Right;    Family History  Problem Relation Age of Onset   Hyperlipidemia Mother    Hypertension Mother    Transient ischemic attack Mother    Hyperlipidemia Father    Hypertension Father    Lung cancer Father    BRCA 1/2 Maternal Grandmother    Social History   Socioeconomic History   Marital status: Married    Spouse name: Not on file   Number of children: Not on file   Years of education: Not on file   Highest education level: Not on file  Occupational History   Not on file  Tobacco Use   Smoking status: Never   Smokeless tobacco: Never  Vaping Use   Vaping status: Never Used  Substance and Sexual Activity   Alcohol use: No   Drug use: No   Sexual activity: Yes    Birth control/protection: Surgical    Comment: hysterectomy  Other Topics Concern   Not on file  Social History Narrative   Not on file   Social Determinants of Health   Financial Resource Strain: Not on file  Food Insecurity: Not on file  Transportation Needs: Not on file  Physical Activity: Inactive (08/08/2021)   Exercise Vital Sign    Days of Exercise per Week: 0 days    Minutes  of Exercise per Session: 0 min  Stress: No Stress Concern Present (08/08/2021)   [employer] of Occupational Health - Occupational Stress Questionnaire    Feeling of Stress : Not at all  Social Connections: Moderately Integrated (08/08/2021)   Social Connection and Isolation Panel [NHANES]    Frequency of Communication with Friends and Family: More than three times a week    Frequency of Social Gatherings with Friends and Family: Once a week    Attends Religious Services: More than 4 times per year    Active Member of [employer] or Organizations:  No    Attends Engineer, structural: Never    Marital Status: Married    Objective:  BP (!) 144/100   Pulse 100   Temp (!) 95.8 F (35.4 C)   Resp 16   Ht 5\' 4"  (1.626 m)   Wt 157 lb 6.4 oz (71.4 kg)   BMI 27.02 kg/m      05/03/2023    7:59 AM 05/03/2023    7:22 AM 11/02/2022    7:24 AM  BP/Weight  Systolic BP 144 148 106  Diastolic BP 100 98 68  Wt. (Lbs)  157.4 153.6  BMI  27.02 kg/m2 26.37 kg/m2    Physical Exam Vitals reviewed.  Constitutional:      Appearance: Normal appearance. She is normal weight.  Neck:     Vascular: No carotid bruit.  Cardiovascular:     Rate and Rhythm: Normal rate and regular rhythm.     Heart sounds: Normal heart sounds.  Pulmonary:     Effort: Pulmonary effort is normal. No respiratory distress.     Breath sounds: Normal breath sounds.  Abdominal:     General: Abdomen is flat. Bowel sounds are normal.     Palpations: Abdomen is soft.     Tenderness: There is no abdominal tenderness.  Neurological:     Mental Status: She is alert and oriented to person, place, and time.  Psychiatric:        Behavior: Behavior normal.     Comments: Anxious and tearful.     Diabetic Foot Exam - Simple   Simple Foot Form Diabetic Foot exam was performed with the following findings: Yes 05/03/2023  7:28 AM  Visual Inspection No deformities, no ulcerations, no other skin breakdown bilaterally: Yes Sensation Testing Intact to touch and monofilament testing bilaterally: Yes Pulse Check Posterior Tibialis and Dorsalis pulse intact bilaterally: Yes Comments      Lab Results  Component Value Date   WBC 5.4 05/01/2023   HGB 14.3 05/01/2023   HCT 45.3 05/01/2023   PLT 443 05/01/2023   GLUCOSE 99 05/01/2023   CHOL 144 05/01/2023   TRIG 115 05/01/2023   HDL 71 05/01/2023   LDLCALC 53 05/01/2023   ALT 11 05/01/2023   AST 18 05/01/2023   NA 135 05/01/2023   K 5.2 05/01/2023   CL 97 05/01/2023   CREATININE 0.84 05/01/2023   BUN 16  05/01/2023   CO2 22 05/01/2023   INR 0.92 04/01/2013   HGBA1C 6.2 (H) 05/01/2023   MICROALBUR 10 06/29/2021      Assessment & Plan:    Hypertension associated with diabetes (HCC) Assessment & Plan: Continue lisinopril 40 mg twice daily.  Add diltiazem 120 mg once daily.  Labs reviewed.  Normal.  Orders: -     dilTIAZem HCl ER; Take 1 capsule (120 mg total) by mouth daily.  Dispense: 30 capsule; Refill: 2  Gastroesophageal reflux disease without  esophagitis Assessment & Plan: The current medical regimen is effective;  continue present plan and medications. Continue omeprazole 20 mg once daily.    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 Recommend check sugars fasting daily. Recommend check feet daily. Recommend annual eye exams. Medicines: Synjardy XR 12.01/999 mg 2 daily and ozempic. Ozempic 0.5 mg weekly and aspirin 81 mg daily. Continue to work on eating a healthy diet and exercise.      Mixed hyperlipidemia Assessment & Plan: Well controlled.  No changes to medicines. Continue pravastatin 10 mg daily and lovaza 1 gm 2 capsules twice daily.  Continue to work on eating a healthy diet and exercise.  Labs reviewed   Migraine without aura and without status migrainosus, not intractable Assessment & Plan: Start on nurtec every other day.  Rx: zofran for nausea. Ketorolac 60 mg IM #1 was given at the office.   Orders: -     Ketorolac Tromethamine -     Nurtec; Take 1 tablet (75 mg total) by mouth every other day.  Dispense: 16 tablet; Refill: 2 -     Ondansetron HCl; Take 1 tablet (4 mg total) by mouth every 8 (eight) hours as needed for nausea or vomiting.  Dispense: 40 tablet; Refill: 0  Anxiety Assessment & Plan: Start lorazepam 0.5 mg twice daily as needed anxiety.    Orders: -     LORazepam; Take 1 tablet (0.5 mg total) by mouth 2 (two) times daily as needed for anxiety.  Dispense: 30 tablet; Refill: 1     Meds ordered this  encounter  Medications   ketorolac (TORADOL) injection 60 mg   diltiazem (DILACOR XR) 120 MG 24 hr capsule    Sig: Take 1 capsule (120 mg total) by mouth daily.    Dispense:  30 capsule    Refill:  2   LORazepam (ATIVAN) 0.5 MG tablet    Sig: Take 1 tablet (0.5 mg total) by mouth 2 (two) times daily as needed for anxiety.    Dispense:  30 tablet    Refill:  1   Rimegepant Sulfate (NURTEC) 75 MG TBDP    Sig: Take 1 tablet (75 mg total) by mouth every other day.    Dispense:  16 tablet    Refill:  2   ondansetron (ZOFRAN) 4 MG tablet    Sig: Take 1 tablet (4 mg total) by mouth every 8 (eight) hours as needed for nausea or vomiting.    Dispense:  40 tablet    Refill:  0    No orders of the defined types were placed in this encounter.    Follow-up: Return in about 6 weeks (around 06/14/2023).  Total time spent on today's visit was greater than 40 minutes, including both face-to-face time and nonface-to-face time personally spent on review of chart (labs and imaging), discussing labs and goals, discussing further work-up, treatment options, referrals to specialist if needed, reviewing outside records of pertinent, answering patient's questions, and coordinating care.  I,Marla I Leal-Borjas,acting as a scribe for Blane Ohara, MD.,have documented all relevant documentation on the behalf of Blane Ohara, MD,as directed by  Blane Ohara, MD while in the presence of Blane Ohara, MD.   An After Visit Summary was printed and given to the patient.  Blane Ohara, MD [facility] [phone]

## 2023-05-02 NOTE — Progress Notes (Signed)
Patient was informed about UA was negative for UTI. She is diabetic. She has large amount of glucose in the urine. She does not have any symptoms.

## 2023-05-03 ENCOUNTER — Ambulatory Visit: Payer: BC Managed Care – PPO | Admitting: Family Medicine

## 2023-05-03 ENCOUNTER — Encounter: Payer: Self-pay | Admitting: Family Medicine

## 2023-05-03 VITALS — BP 144/100 | HR 100 | Temp 95.8°F | Resp 16 | Ht 64.0 in | Wt 157.4 lb

## 2023-05-03 DIAGNOSIS — E1159 Type 2 diabetes mellitus with other circulatory complications: Secondary | ICD-10-CM | POA: Diagnosis not present

## 2023-05-03 DIAGNOSIS — E1121 Type 2 diabetes mellitus with diabetic nephropathy: Secondary | ICD-10-CM

## 2023-05-03 DIAGNOSIS — G43009 Migraine without aura, not intractable, without status migrainosus: Secondary | ICD-10-CM

## 2023-05-03 DIAGNOSIS — I152 Hypertension secondary to endocrine disorders: Secondary | ICD-10-CM

## 2023-05-03 DIAGNOSIS — K219 Gastro-esophageal reflux disease without esophagitis: Secondary | ICD-10-CM | POA: Diagnosis not present

## 2023-05-03 DIAGNOSIS — E782 Mixed hyperlipidemia: Secondary | ICD-10-CM | POA: Diagnosis not present

## 2023-05-03 DIAGNOSIS — F419 Anxiety disorder, unspecified: Secondary | ICD-10-CM

## 2023-05-03 LAB — COMPREHENSIVE METABOLIC PANEL
ALT: 11 IU/L (ref 0–32)
AST: 18 IU/L (ref 0–40)
Albumin: 4.6 g/dL (ref 3.9–4.9)
Alkaline Phosphatase: 81 IU/L (ref 44–121)
BUN/Creatinine Ratio: 19 (ref 12–28)
BUN: 16 mg/dL (ref 8–27)
Bilirubin Total: 0.5 mg/dL (ref 0.0–1.2)
CO2: 22 mmol/L (ref 20–29)
Calcium: 10 mg/dL (ref 8.7–10.3)
Chloride: 97 mmol/L (ref 96–106)
Creatinine, Ser: 0.84 mg/dL (ref 0.57–1.00)
Globulin, Total: 2.6 g/dL (ref 1.5–4.5)
Glucose: 99 mg/dL (ref 70–99)
Potassium: 5.2 mmol/L (ref 3.5–5.2)
Sodium: 135 mmol/L (ref 134–144)
Total Protein: 7.2 g/dL (ref 6.0–8.5)
eGFR: 77 mL/min/{1.73_m2} (ref >59)

## 2023-05-03 LAB — CBC WITH DIFFERENTIAL/PLATELET
Basophils Absolute: 0.1 10*3/uL (ref 0.0–0.2)
Basos: 1 %
EOS (ABSOLUTE): 0.1 10*3/uL (ref 0.0–0.4)
Eos: 2 %
Hematocrit: 45.3 % (ref 34.0–46.6)
Hemoglobin: 14.3 g/dL (ref 11.1–15.9)
Immature Grans (Abs): 0 10*3/uL (ref 0.0–0.1)
Immature Granulocytes: 0 %
Lymphocytes Absolute: 1.6 10*3/uL (ref 0.7–3.1)
Lymphs: 29 %
MCH: 26.2 pg — ABNORMAL LOW (ref 26.6–33.0)
MCHC: 31.6 g/dL (ref 31.5–35.7)
MCV: 83 fL (ref 79–97)
Monocytes Absolute: 0.5 10*3/uL (ref 0.1–0.9)
Monocytes: 8 %
Neutrophils Absolute: 3.2 10*3/uL (ref 1.4–7.0)
Neutrophils: 60 %
Platelets: 443 10*3/uL (ref 150–450)
RBC: 5.46 x10E6/uL — ABNORMAL HIGH (ref 3.77–5.28)
RDW: 14.8 % (ref 11.7–15.4)
WBC: 5.4 10*3/uL (ref 3.4–10.8)

## 2023-05-03 LAB — LIPID PANEL
Chol/HDL Ratio: 2 ratio (ref 0.0–4.4)
Cholesterol, Total: 144 mg/dL (ref 100–199)
HDL: 71 mg/dL (ref >39)
LDL Chol Calc (NIH): 53 mg/dL (ref 0–99)
Triglycerides: 115 mg/dL (ref 0–149)
VLDL Cholesterol Cal: 20 mg/dL (ref 5–40)

## 2023-05-03 LAB — HEMOGLOBIN A1C
Est. average glucose Bld gHb Est-mCnc: 131 mg/dL
Hgb A1c MFr Bld: 6.2 % — ABNORMAL HIGH (ref 4.8–5.6)

## 2023-05-03 LAB — INHIBIN B: Inhibin B: <7 pg/mL (ref 0.0–16.9)

## 2023-05-03 LAB — INHIBIN A: Inhibin A, Ultrasensitive: 1.9 pg/mL

## 2023-05-03 MED ORDER — DILTIAZEM HCL ER 120 MG PO CP24: 120.0000 mg | ORAL_CAPSULE | Freq: Every day | ORAL | 2 refills | Status: DC

## 2023-05-03 MED ORDER — KETOROLAC TROMETHAMINE 60 MG/2ML IM SOLN
60.0000 mg | Freq: Once | INTRAMUSCULAR | Status: AC
  Administered 2023-05-03: 60 mg via INTRAMUSCULAR

## 2023-05-03 MED ORDER — ONDANSETRON HCL 4 MG PO TABS: 4.0000 mg | ORAL_TABLET | Freq: Three times a day (TID) | ORAL | 0 refills | Status: AC | PRN

## 2023-05-03 MED ORDER — NURTEC 75 MG PO TBDP: 75.0000 mg | ORAL_TABLET | ORAL | 2 refills | Status: DC

## 2023-05-03 MED ORDER — LORAZEPAM 0.5 MG PO TABS: 0.5000 mg | ORAL_TABLET | Freq: Two times a day (BID) | ORAL | 1 refills | Status: DC | PRN

## 2023-05-03 NOTE — Patient Instructions (Addendum)
For anxiety: Start lorazepam 0.5 mg twice daily as needed anxiety.   For migraines: Start on nurtec every other day.  Rx: zofran for nausea.  For Hypertension: Add diltiazem 120 mg once daily.

## 2023-05-04 ENCOUNTER — Other Ambulatory Visit: Payer: Self-pay

## 2023-05-04 ENCOUNTER — Encounter: Payer: Self-pay | Admitting: Family Medicine

## 2023-05-05 MED ORDER — NIRMATRELVIR/RITONAVIR (PAXLOVID)TABLET: 3.0000 | ORAL_TABLET | Freq: Two times a day (BID) | ORAL | 0 refills | Status: AC

## 2023-05-05 NOTE — Assessment & Plan Note (Addendum)
Start on nurtec every other day.  Rx: zofran for nausea. Ketorolac 60 mg IM #1 was given at the office.

## 2023-05-05 NOTE — Assessment & Plan Note (Addendum)
Continue lisinopril 40 mg twice daily.  Add diltiazem 120 mg once daily.  Labs reviewed.  Normal.

## 2023-05-05 NOTE — Assessment & Plan Note (Signed)
Start lorazepam 0.5 mg twice daily as needed anxiety.

## 2023-05-17 ENCOUNTER — Other Ambulatory Visit: Payer: Self-pay | Admitting: Family Medicine

## 2023-05-29 ENCOUNTER — Other Ambulatory Visit: Payer: Self-pay | Admitting: Family Medicine

## 2023-06-15 ENCOUNTER — Ambulatory Visit: Payer: BC Managed Care – PPO | Admitting: Family Medicine

## 2023-06-15 ENCOUNTER — Encounter: Payer: Self-pay | Admitting: Family Medicine

## 2023-06-15 VITALS — BP 126/78 | HR 73 | Temp 97.0°F | Ht 64.0 in | Wt 157.0 lb

## 2023-06-15 DIAGNOSIS — F419 Anxiety disorder, unspecified: Secondary | ICD-10-CM

## 2023-06-15 DIAGNOSIS — Z13820 Encounter for screening for osteoporosis: Secondary | ICD-10-CM

## 2023-06-15 DIAGNOSIS — Z780 Asymptomatic menopausal state: Secondary | ICD-10-CM

## 2023-06-15 DIAGNOSIS — G43009 Migraine without aura, not intractable, without status migrainosus: Secondary | ICD-10-CM

## 2023-06-15 MED ORDER — VORTIOXETINE HBR 5 MG PO TABS: 5.0000 mg | ORAL_TABLET | Freq: Every day | ORAL | Status: DC

## 2023-06-15 MED ORDER — UBRELVY 100 MG PO TABS: ORAL_TABLET | ORAL | Status: DC

## 2023-06-15 MED ORDER — AIMOVIG 140 MG/ML ~~LOC~~ SOAJ: 140.0000 mg | SUBCUTANEOUS | 3 refills | Status: DC

## 2023-06-15 NOTE — Progress Notes (Unsigned)
Subjective:  Patient ID: Lindsey [name], female    DOB: 04-18-1956  Age: 67 y.o. MRN: [id number]  Chief Complaint  Patient presents with   6 week f/u   HPI Hypertension: Per last note: Current medications: Lisinopril 40 mg twice daily, added diltiazem 120 mg daily during last visit. Previously had been on HCTZ and calcium channel blockers.  Patient had lost weight was eating healthy and exercising and we were able to wean some of her blood pressure medicines off.  Migraines: states she stopped taking nurtec due it not helping very much.  Would like to try aimovig again. Has tired and failed imitrex, verapamil. Migraine every other day.   States starting a medication for anxiety is supposed to be discussed today as well.     06/15/2023   11:22 AM 05/03/2023    7:27 AM 04/28/2022    7:39 AM 08/20/2021    8:30 PM 01/28/2021   11:12 AM  Depression screen PHQ 2/9  Decreased Interest 0 0 0 0 0  Down, Depressed, Hopeless 0 0 0 0 0  PHQ - 2 Score 0 0 0 0 0  Altered sleeping 0    0  Tired, decreased energy 1    1  Change in appetite 0    0  Feeling bad or failure about yourself  0    0  Trouble concentrating 0    0  Moving slowly or fidgety/restless 0    0  Suicidal thoughts 0    0  PHQ-9 Score 1    1  Difficult doing work/chores Not difficult at all    Not difficult at all        06/15/2023   11:22 AM 05/03/2023    7:27 AM 04/28/2022    7:39 AM 10/28/2020    1:58 PM  GAD 7 : Generalized Anxiety Score  Nervous, Anxious, on Edge 1 0 0 1  Control/stop worrying 1 0 0 1  Worry too much - different things 1 0 0 0  Trouble relaxing 1 0 0 1  Restless 0 0 0 0  Easily annoyed or irritable 1 0 0 0  Afraid - awful might happen 1 0 0 1  Total GAD 7 Score 6 0 0 4  Anxiety Difficulty Somewhat difficult Not difficult at all Not difficult at all         05/03/2023    7:27 AM  Fall Risk   Falls in the past year? 0  Number falls in past yr: 0  Injury with Fall? 0  Risk for fall due to :  No Fall Risks  Follow up Falls evaluation completed;Falls prevention discussed    Patient Care Team: Kelsea Mousel, Fritzi Mandes, MD as PCP - General (Family Medicine)   Review of Systems  Constitutional:  Negative for chills, fatigue and fever.  HENT:  Negative for congestion, ear pain, rhinorrhea and sore throat.   Respiratory:  Negative for cough and shortness of breath.   Cardiovascular:  Negative for chest pain.  Gastrointestinal:  Negative for abdominal pain, constipation, diarrhea, nausea and vomiting.  Genitourinary:  Negative for dysuria and urgency.  Musculoskeletal:  Negative for back pain and myalgias.  Neurological:  Negative for dizziness, weakness, light-headedness and headaches.  Psychiatric/Behavioral:  Negative for dysphoric mood. The patient is not nervous/anxious.     Current Outpatient Medications on File Prior to Visit  Medication Sig Dispense Refill   aspirin EC 81 MG tablet Take 81 mg by mouth every morning.  Calcium Carb-Cholecalciferol (CALCIUM 600 + D PO) Take 1 tablet by mouth at bedtime.     diltiazem (DILACOR XR) 120 MG 24 hr capsule Take 1 capsule (120 mg total) by mouth daily. 30 capsule 2   glucose blood (CONTOUR NEXT TEST) test strip Use as instructed 100 each 3   Lancet Devices (MICROLET NEXT LANCING DEVICE) MISC 1 each by Does not apply route in the morning. 1 each 0   lisinopril (ZESTRIL) 40 MG tablet TAKE 1 TABLET BY MOUTH TWICE DAILY IN THE MORNING AND AT BEDTIME. 180 tablet 0   LORazepam (ATIVAN) 0.5 MG tablet Take 1 tablet (0.5 mg total) by mouth 2 (two) times daily as needed for anxiety. 30 tablet 1   Magnesium 250 MG TABS Take by mouth in the morning and at bedtime.     omega-3 acid ethyl esters (LOVAZA) 1 g capsule TAKE 2 CAPSULES BY MOUTH TWICE  DAILY 360 capsule 3   omeprazole (PRILOSEC) 20 MG capsule TAKE 1 CAPSULE BY MOUTH ONCE  DAILY BEFORE A MEAL 90 capsule 3   ondansetron (ZOFRAN) 4 MG tablet Take 1 tablet (4 mg total) by mouth every 8 (eight)  hours as needed for nausea or vomiting. 40 tablet 0   pravastatin (PRAVACHOL) 10 MG tablet TAKE 1 TABLET BY MOUTH ONCE  DAILY 90 tablet 3   Probiotic Product (PROBIOTIC BLEND PO) Take by mouth daily.     Semaglutide,0.25 or 0.5MG /DOS, (OZEMPIC, 0.25 OR 0.5 MG/DOSE,) 2 MG/3ML SOPN INJECT 0.5 MG DOSE INTO THE SKIN ONCE A WEEK. 3 mL 1   SUMAtriptan (IMITREX) 100 MG tablet TAKE 1 TABLET BY MOUTH AT ONSET  OF MIGRAINE MAY REPEAT IN 2  HOURS AS NEEDED. MAX OF 2  TABLETS DAILY 27 tablet 1   SYNJARDY XR 12.01-999 MG TB24 TAKE 2 TABLETS BY MOUTH DAILY 180 tablet 3   zolpidem (AMBIEN CR) 12.5 MG CR tablet TAKE 1 TABLET BY MOUTH AT  BEDTIME 90 tablet 0   No current facility-administered medications on file prior to visit.   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 Blane Ohara, LOV 12/19/21 in Epic as of 03/15/22.   Dyslipidemia associated with type 2 diabetes mellitus [facility])    Patient follows with Dr. Blane Ohara, LOV 12/19/21 in Epic as of 03/15/22.   Frequent UTI     h/o since hysterectomy/04/01/13 asymptomatic   GERD (gastroesophageal reflux disease)    Takes Omeprazole daily as of 03/15/22.   Headache(784.0)    migraines   Hypertension    Currently on Lisinopril. Follows w/ PCP Dr. Blane Ohara, Theron Arista 12/19/21 as of 03/15/22.   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 Overview:  The patient in 09/2009 underwent TAHBSO with the findings of a ruptured 10 cm right ovarian mass. 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   Past Surgical History:  Procedure Laterality Date   ABDOMINAL HYSTERECTOMY  2011   Stromal ovarian cancer   ANTERIOR AND  POSTERIOR REPAIR WITH SACROSPINOUS FIXATION N/A 03/27/2022   Procedure: ANTERIOR REPAIR WITH SACROSPINOUS FIXATION;  Surgeon: Marguerita Beards, MD;  Location: [facility]  [place];  Service: Gynecology;  Laterality: N/A;   CHOLECYSTECTOMY  1989   COLONOSCOPY  2010   no polyps per pt   CYSTOSCOPY N/A 03/27/2022   Procedure: CYSTOSCOPY;  Surgeon: Marguerita Beards, MD;  Location: [facility];  Service: Gynecology;  Laterality: N/A;   ENTEROCELE REPAIR     around 2007   KNEE ARTHROSCOPY Right    many yrs ago before knee replacemnt   KNEE ARTHROSCOPY Right 07/01/2014   Procedure: RIGHT ARTHROSCOPY KNEE WITH SYNOVECTOMY;  Surgeon: Loanne Drilling, MD;  Location: [facility];  Service: Orthopedics;  Laterality: Right;   RECTOCELE REPAIR     around 2007   TONSILLECTOMY  1975   TOTAL KNEE ARTHROPLASTY Right 04/07/2013   Procedure: RIGHT TOTAL KNEE ARTHROPLASTY;  Surgeon: Loanne Drilling, MD;  Location: [facility];  Service: Orthopedics;  Laterality: Right;    Family History  Problem Relation Age of Onset   Hyperlipidemia Mother    Hypertension Mother    Transient ischemic attack Mother    Hyperlipidemia Father    Hypertension Father    Lung cancer Father    BRCA 1/2 Maternal Grandmother    Social History   Socioeconomic History   Marital status: Married    Spouse name: Not on file   Number of children: Not on file   Years of education: Not on file   Highest education level: Not on file  Occupational History   Not on file  Tobacco Use   Smoking status: Never   Smokeless tobacco: Never  Vaping Use   Vaping status: Never Used  Substance and Sexual Activity   Alcohol use: No   Drug use: No   Sexual activity: Yes    Birth control/protection: Surgical    Comment: hysterectomy  Other Topics Concern   Not on file  Social History Narrative   Not on file   Social Determinants of Health   Financial Resource Strain: Low Risk  (06/15/2023)   Overall Financial  Resource Strain (CARDIA)    Difficulty of Paying Living Expenses: Not hard at all  Food Insecurity: No Food Insecurity (06/15/2023)   Hunger Vital Sign    Worried About Running Out of Food in the Last Year: Never true    Ran Out of Food in the Last Year: Never true  Transportation Needs: No Transportation Needs (06/15/2023)   PRAPARE - Administrator, Civil Service (Medical): No    Lack of Transportation (Non-Medical): No  Physical Activity: Inactive (06/15/2023)   Exercise Vital Sign    Days of Exercise per Week: 0 days    Minutes of Exercise per Session: 0 min  Stress: No Stress Concern Present (08/08/2021)   [employer] of Occupational Health - Occupational Stress Questionnaire    Feeling of Stress : Not at all  Social Connections: Moderately Integrated (06/15/2023)   Social Connection and Isolation Panel [NHANES]    Frequency of Communication with Friends and Family: More than three times a week    Frequency of Social Gatherings with Friends and Family: Once a week    Attends Religious Services: More than 4 times per year    Active Member of [employer] or Organizations: No    Attends Banker Meetings: Never    Marital Status: Married    Objective:  BP 126/78   Pulse 73   Temp (!) 97 F (36.1 C)   Ht 5\' 4"  (1.626 m)   Wt 157 lb (71.2  kg)   SpO2 94%   BMI 26.95 kg/m      06/15/2023   11:13 AM 05/03/2023    7:59 AM 05/03/2023    7:22 AM  BP/Weight  Systolic BP 126 144 148  Diastolic BP 78 100 98  Wt. (Lbs) 157  157.4  BMI 26.95 kg/m2  27.02 kg/m2    Physical Exam Vitals reviewed.  Constitutional:      Appearance: Normal appearance. She is normal weight.  Neck:     Vascular: No carotid bruit.  Cardiovascular:     Rate and Rhythm: Normal rate and regular rhythm.     Heart sounds: Normal heart sounds.  Pulmonary:     Effort: Pulmonary effort is normal. No respiratory distress.     Breath sounds: Normal breath sounds.  Abdominal:      General: Abdomen is flat. Bowel sounds are normal.     Palpations: Abdomen is soft.     Tenderness: There is no abdominal tenderness.  Neurological:     Mental Status: She is alert and oriented to person, place, and time.  Psychiatric:        Mood and Affect: Mood normal.        Behavior: Behavior normal.     Diabetic Foot Exam - Simple   No data filed      Lab Results  Component Value Date   WBC 5.4 05/01/2023   HGB 14.3 05/01/2023   HCT 45.3 05/01/2023   PLT 443 05/01/2023   GLUCOSE 99 05/01/2023   CHOL 144 05/01/2023   TRIG 115 05/01/2023   HDL 71 05/01/2023   LDLCALC 53 05/01/2023   ALT 11 05/01/2023   AST 18 05/01/2023   NA 135 05/01/2023   K 5.2 05/01/2023   CL 97 05/01/2023   CREATININE 0.84 05/01/2023   BUN 16 05/01/2023   CO2 22 05/01/2023   INR 0.92 04/01/2013   HGBA1C 6.2 (H) 05/01/2023   MICROALBUR 10 06/29/2021      Assessment & Plan:    Migraine without aura and without status migrainosus, not intractable Assessment & Plan: Sent Aimovig.  Orders: -     Aimovig; Inject 140 mg into the skin every 30 (thirty) days.  Dispense: 3 mL; Refill: 3 -     Ubrelvy; One at on set of migraine. May repeat in 2 hours x 1 in 24 hous.  Encounter for osteoporosis screening in asymptomatic postmenopausal patient -     DG Bone Density; Future  Anxiety Assessment & Plan: Start Trintellix.  Orders: -     Vortioxetine HBr; Take 1 tablet (5 mg total) by mouth daily.     Meds ordered this encounter  Medications   Erenumab-aooe (AIMOVIG) 140 MG/ML SOAJ    Sig: Inject 140 mg into the skin every 30 (thirty) days.    Dispense:  3 mL    Refill:  3   vortioxetine HBr (TRINTELLIX) 5 MG TABS tablet    Sig: Take 1 tablet (5 mg total) by mouth daily.   Ubrogepant (UBRELVY) 100 MG TABS    Sig: One at on set of migraine. May repeat in 2 hours x 1 in 24 hous.    Orders Placed This Encounter  Procedures   DG Bone Density     Follow-up: Return in about 3 months  (around 09/15/2023) for chronic follow up.   I,Katherina A Bramblett,acting as a scribe for Blane Ohara, MD.,have documented all relevant documentation on the behalf of Blane Ohara, MD,as directed by  Blane Ohara, MD while in the presence of Blane Ohara, MD.   Clayborn Bigness I Leal-Borjas,acting as a scribe for Blane Ohara, MD.,have documented all relevant documentation on the behalf of Blane Ohara, MD,as directed by  Blane Ohara, MD while in the presence of Blane Ohara, MD.    An After Visit Summary was printed and given to the patient.  Blane Ohara, MD [facility] [phone]

## 2023-06-16 NOTE — Assessment & Plan Note (Signed)
Start Trintellix

## 2023-06-16 NOTE — Assessment & Plan Note (Signed)
Sent Aimovig

## 2023-07-12 ENCOUNTER — Other Ambulatory Visit: Payer: Self-pay | Admitting: Family Medicine

## 2023-07-27 ENCOUNTER — Other Ambulatory Visit: Payer: Self-pay | Admitting: Family Medicine

## 2023-07-27 DIAGNOSIS — I152 Hypertension secondary to endocrine disorders: Secondary | ICD-10-CM

## 2023-08-11 ENCOUNTER — Other Ambulatory Visit: Payer: Self-pay | Admitting: Family Medicine

## 2023-08-11 DIAGNOSIS — E1121 Type 2 diabetes mellitus with diabetic nephropathy: Secondary | ICD-10-CM

## 2023-08-14 DIAGNOSIS — N959 Unspecified menopausal and perimenopausal disorder: Secondary | ICD-10-CM | POA: Diagnosis not present

## 2023-08-14 DIAGNOSIS — M810 Age-related osteoporosis without current pathological fracture: Secondary | ICD-10-CM | POA: Diagnosis not present

## 2023-08-14 LAB — HM DEXA SCAN

## 2023-08-17 ENCOUNTER — Other Ambulatory Visit: Payer: Self-pay

## 2023-09-01 ENCOUNTER — Other Ambulatory Visit: Payer: Self-pay | Admitting: Family Medicine

## 2023-09-17 ENCOUNTER — Other Ambulatory Visit: Payer: Self-pay

## 2023-09-17 DIAGNOSIS — E1121 Type 2 diabetes mellitus with diabetic nephropathy: Secondary | ICD-10-CM

## 2023-09-17 DIAGNOSIS — E782 Mixed hyperlipidemia: Secondary | ICD-10-CM

## 2023-09-17 DIAGNOSIS — E1159 Type 2 diabetes mellitus with other circulatory complications: Secondary | ICD-10-CM

## 2023-09-19 ENCOUNTER — Other Ambulatory Visit: Payer: BC Managed Care – PPO

## 2023-09-19 DIAGNOSIS — E1159 Type 2 diabetes mellitus with other circulatory complications: Secondary | ICD-10-CM | POA: Diagnosis not present

## 2023-09-19 DIAGNOSIS — I152 Hypertension secondary to endocrine disorders: Secondary | ICD-10-CM | POA: Diagnosis not present

## 2023-09-19 DIAGNOSIS — E782 Mixed hyperlipidemia: Secondary | ICD-10-CM | POA: Diagnosis not present

## 2023-09-19 DIAGNOSIS — E1121 Type 2 diabetes mellitus with diabetic nephropathy: Secondary | ICD-10-CM | POA: Diagnosis not present

## 2023-09-20 ENCOUNTER — Other Ambulatory Visit: Payer: BC Managed Care – PPO

## 2023-09-20 ENCOUNTER — Other Ambulatory Visit: Payer: Self-pay

## 2023-09-20 ENCOUNTER — Other Ambulatory Visit: Payer: Self-pay | Admitting: Family Medicine

## 2023-09-20 LAB — COMPREHENSIVE METABOLIC PANEL
ALT: 15 [IU]/L (ref 0–32)
AST: 19 [IU]/L (ref 0–40)
Albumin: 4.4 g/dL (ref 3.9–4.9)
Alkaline Phosphatase: 76 [IU]/L (ref 44–121)
BUN/Creatinine Ratio: 22 (ref 12–28)
BUN: 19 mg/dL (ref 8–27)
Bilirubin Total: 0.6 mg/dL (ref 0.0–1.2)
CO2: 20 mmol/L (ref 20–29)
Calcium: 10.1 mg/dL (ref 8.7–10.3)
Chloride: 99 mmol/L (ref 96–106)
Creatinine, Ser: 0.87 mg/dL (ref 0.57–1.00)
Globulin, Total: 2.4 g/dL (ref 1.5–4.5)
Glucose: 92 mg/dL (ref 70–99)
Potassium: 5.2 mmol/L (ref 3.5–5.2)
Sodium: 136 mmol/L (ref 134–144)
Total Protein: 6.8 g/dL (ref 6.0–8.5)
eGFR: 73 mL/min/{1.73_m2} (ref >59)

## 2023-09-20 LAB — CBC WITH DIFFERENTIAL/PLATELET
Basophils Absolute: 0 10*3/uL (ref 0.0–0.2)
Basos: 1 %
EOS (ABSOLUTE): 0.1 10*3/uL (ref 0.0–0.4)
Eos: 2 %
Hematocrit: 42.8 % (ref 34.0–46.6)
Hemoglobin: 13.6 g/dL (ref 11.1–15.9)
Immature Grans (Abs): 0 10*3/uL (ref 0.0–0.1)
Immature Granulocytes: 0 %
Lymphocytes Absolute: 1.5 10*3/uL (ref 0.7–3.1)
Lymphs: 25 %
MCH: 26.3 pg — ABNORMAL LOW (ref 26.6–33.0)
MCHC: 31.8 g/dL (ref 31.5–35.7)
MCV: 83 fL (ref 79–97)
Monocytes Absolute: 0.5 10*3/uL (ref 0.1–0.9)
Monocytes: 9 %
Neutrophils Absolute: 3.8 10*3/uL (ref 1.4–7.0)
Neutrophils: 63 %
Platelets: 454 10*3/uL — ABNORMAL HIGH (ref 150–450)
RBC: 5.17 x10E6/uL (ref 3.77–5.28)
RDW: 13.7 % (ref 11.7–15.4)
WBC: 5.9 10*3/uL (ref 3.4–10.8)

## 2023-09-20 LAB — MICROALBUMIN / CREATININE URINE RATIO
Creatinine, Urine: 120.8 mg/dL
Microalb/Creat Ratio: 6 mg/g{creat} (ref 0–29)
Microalbumin, Urine: 6.8 ug/mL

## 2023-09-20 LAB — LIPID PANEL
Chol/HDL Ratio: 2 {ratio} (ref 0.0–4.4)
Cholesterol, Total: 146 mg/dL (ref 100–199)
HDL: 73 mg/dL (ref >39)
LDL Chol Calc (NIH): 55 mg/dL (ref 0–99)
Triglycerides: 96 mg/dL (ref 0–149)
VLDL Cholesterol Cal: 18 mg/dL (ref 5–40)

## 2023-09-20 LAB — HEMOGLOBIN A1C
Est. average glucose Bld gHb Est-mCnc: 128 mg/dL
Hgb A1c MFr Bld: 6.1 % — ABNORMAL HIGH (ref 4.8–5.6)

## 2023-09-24 ENCOUNTER — Ambulatory Visit: Payer: BC Managed Care – PPO | Admitting: Family Medicine

## 2023-09-24 ENCOUNTER — Encounter: Payer: Self-pay | Admitting: Family Medicine

## 2023-09-24 ENCOUNTER — Telehealth: Payer: Self-pay

## 2023-09-24 VITALS — BP 124/86 | HR 82 | Temp 98.2°F | Ht 64.0 in | Wt 157.0 lb

## 2023-09-24 DIAGNOSIS — R7989 Other specified abnormal findings of blood chemistry: Secondary | ICD-10-CM

## 2023-09-24 DIAGNOSIS — E1121 Type 2 diabetes mellitus with diabetic nephropathy: Secondary | ICD-10-CM

## 2023-09-24 DIAGNOSIS — G43009 Migraine without aura, not intractable, without status migrainosus: Secondary | ICD-10-CM

## 2023-09-24 DIAGNOSIS — E1159 Type 2 diabetes mellitus with other circulatory complications: Secondary | ICD-10-CM

## 2023-09-24 DIAGNOSIS — F5101 Primary insomnia: Secondary | ICD-10-CM

## 2023-09-24 DIAGNOSIS — K219 Gastro-esophageal reflux disease without esophagitis: Secondary | ICD-10-CM

## 2023-09-24 DIAGNOSIS — E782 Mixed hyperlipidemia: Secondary | ICD-10-CM

## 2023-09-24 DIAGNOSIS — I152 Hypertension secondary to endocrine disorders: Secondary | ICD-10-CM

## 2023-09-24 NOTE — Assessment & Plan Note (Signed)
Ongoing struggle with migraines. Previous trials of [place], [place], [place], [place] with limited success. Aimovig prescribed but not yet started due to insurance issues. -Complete prior authorization for Aimovig. -Transfer Aimovig prescription to [facility].

## 2023-09-24 NOTE — Assessment & Plan Note (Signed)
Well controlled.  No changes to medicines. Continue pravastatin 10 mg daily and lovaza 1 gm 2 capsules twice daily.  Continue to work on eating a healthy diet and exercise.  Labs reviewed 

## 2023-09-24 NOTE — Telephone Encounter (Signed)
PA submitted and approved via covermymeds for aimovig.

## 2023-09-24 NOTE — Assessment & Plan Note (Signed)
Control: excellent ?Recommend check sugars fasting daily. ?Recommend check feet daily. ?Recommend annual eye exams. ?Medicines: continue Synjardy XR 12.01/999 mg 2 daily ?Ozempic 0.5 mg weekly and aspirin 81 mg daily. ?Continue to work on eating a healthy diet and exercise.  ?Labs reviewed. ?

## 2023-09-24 NOTE — Progress Notes (Signed)
Subjective:  Patient ID: Lindsey [name], female    DOB: 17-Jul-1956  Age: 68 y.o. MRN: [id number]  Chief Complaint  Patient presents with   Medical Management of Chronic Issues    HPI Diabetes:  Complications: glomerulopathy Glucose checking: daily. Hypoglycemia: no  Most recent A1C: 6.2 Current medications: Synjardy XR 12.01/999 mg 2 daily Ozempic 0.5 mg weekly and aspirin 81 mg daily. Last Eye Exam: 04/2023                                                                                                                                                                                                    Foot checks: Daily-  Hyperlipidemia: Current medications: pravachol 10 mg before bed.  Lovaza 1 g 2 capsules twice daily.  Migraines: Using Imitrex often has migraines approx every other day.Marland Kitchen Bernita Raisin, Nurtec did not help. Previously tried [place] for acute management and prevention: nurtec, ajovy, topamax. Currently on diltiazem ([facility]).   Hypertension: Current medications: Lisinopril 40 mg twice daily, aspirin 81mg  daily, dialtiazem xr 120 mg daily,   Insomnia: Well-controlled with zolpidem 12.5 mg CR 1 p.o. nightly.  GERD: Omeprazole 20 mg  daily  Depression: Trintellix 5 mg daily did not help, so stopped it. Patient has decided she is going to retire. Improved regardless.   Diet: Fairly healthy. Exercising.   History of Present Illness The patient, with a history of hypertension, hyperlipidemia, and migraines, presents for a routine follow-up. She reports no new symptoms or concerns. She has been adhering to her medication regimen, which includes Synjardy for diabetes, lisinopril and diltiazem for hypertension, aspirin for cardiovascular disease prevention, Ozempic for diabetes, pravastatin for hyperlipidemia, Lovaza for hypertriglyceridemia, and Imitrex for acute migraine management.  The patient has been struggling with migraines and has tried several medications for both  acute management and prevention, including Ubrelvy, Nurtec, Ajovy, and Qulipta. She has also been prescribed Aimovig, but has had issues with the pharmacy processing the prescription. She expresses frustration with the frequency of needing to take Imitrex due to its side effect of causing tiredness.  The patient also reports a significant life change, as she has decided to retire. She believes this will help reduce her stress levels and potentially improve her overall health.     06/15/2023   11:22 AM 05/03/2023    7:27 AM 04/28/2022    7:39 AM 08/20/2021    8:30 PM 01/28/2021   11:12 AM  Depression screen PHQ 2/9  Decreased Interest 0 0 0 0 0  Down, Depressed, Hopeless 0 0 0 0 0  PHQ - 2 Score  0 0 0 0 0  Altered sleeping 0    0  Tired, decreased energy 1    1  Change in appetite 0    0  Feeling bad or failure about yourself  0    0  Trouble concentrating 0    0  Moving slowly or fidgety/restless 0    0  Suicidal thoughts 0    0  PHQ-9 Score 1    1  Difficult doing work/chores Not difficult at all    Not difficult at all        05/03/2023    7:27 AM  Fall Risk   Falls in the past year? 0  Number falls in past yr: 0  Injury with Fall? 0  Risk for fall due to : No Fall Risks  Follow up Falls evaluation completed;Falls prevention discussed    Patient Care Team: Nature Vogelsang, Fritzi Mandes, MD as PCP - General (Family Medicine) Albin Felling, OD (Optometry)   Review of Systems  Constitutional:  Negative for chills, fatigue and fever.  HENT:  Negative for congestion, ear pain, rhinorrhea and sore throat.   Respiratory:  Negative for cough and shortness of breath.   Cardiovascular:  Negative for chest pain.  Gastrointestinal:  Negative for abdominal pain, constipation, diarrhea, nausea and vomiting.  Genitourinary:  Negative for dysuria and urgency.  Musculoskeletal:  Negative for back pain and myalgias.  Neurological:  Positive for headaches. Negative for dizziness, weakness and  light-headedness.  Psychiatric/Behavioral:  Negative for dysphoric mood. The patient is not nervous/anxious.     Current Outpatient Medications on File Prior to Visit  Medication Sig Dispense Refill   aspirin EC 81 MG tablet Take 81 mg by mouth every morning.     Calcium Carb-Cholecalciferol (CALCIUM 600 + D PO) Take 1 tablet by mouth at bedtime.     DILT-XR 120 MG 24 hr capsule TAKE 1 CAPSULE BY MOUTH DAILY. 30 capsule 1   Erenumab-aooe (AIMOVIG) 140 MG/ML SOAJ Inject 140 mg into the skin every 30 (thirty) days. (Patient not taking: Reported on 09/24/2023) 3 mL 3   glucose blood (CONTOUR NEXT TEST) test strip Use as instructed 100 each 3   Lancet Devices (MICROLET NEXT LANCING DEVICE) MISC 1 each by Does not apply route in the morning. 1 each 0   lisinopril (ZESTRIL) 40 MG tablet TAKE 1 TABLET BY MOUTH TWICE DAILY IN THE MORNING AND AT BEDTIME. 180 tablet 0   LORazepam (ATIVAN) 0.5 MG tablet Take 1 tablet (0.5 mg total) by mouth 2 (two) times daily as needed for anxiety. 30 tablet 1   Magnesium 250 MG TABS Take by mouth in the morning and at bedtime.     omega-3 acid ethyl esters (LOVAZA) 1 g capsule TAKE 2 CAPSULES BY MOUTH TWICE  DAILY 360 capsule 3   omeprazole (PRILOSEC) 20 MG capsule TAKE 1 CAPSULE BY MOUTH ONCE  DAILY BEFORE A MEAL 90 capsule 3   ondansetron (ZOFRAN) 4 MG tablet Take 1 tablet (4 mg total) by mouth every 8 (eight) hours as needed for nausea or vomiting. 40 tablet 0   pravastatin (PRAVACHOL) 10 MG tablet TAKE 1 TABLET BY MOUTH ONCE  DAILY 90 tablet 3   Probiotic Product (PROBIOTIC BLEND PO) Take by mouth daily.     Semaglutide,0.25 or 0.5MG /DOS, (OZEMPIC, 0.25 OR 0.5 MG/DOSE,) 2 MG/3ML SOPN Inject 0.5 mg into the skin once a week. 9 mL 1   SUMAtriptan (IMITREX) 100 MG tablet TAKE 1 TABLET BY  MOUTH AT ONSET  OF MIGRAINE; MAY REPEAT IN 2  HOURS AS NEEDED. MAX OF 2  TABLETS DAILY 27 tablet 1   SYNJARDY XR 12.01-999 MG TB24 TAKE 2 TABLETS BY MOUTH DAILY 180 tablet 3   zolpidem  (AMBIEN CR) 12.5 MG CR tablet TAKE 1 TABLET BY MOUTH AT  BEDTIME 90 tablet 1   No current facility-administered medications on file prior to visit.   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 Blane Ohara, LOV 12/19/21 in Epic as of 03/15/22.   Dyslipidemia associated with type 2 diabetes mellitus [facility])    Patient follows with Dr. Blane Ohara, LOV 12/19/21 in Epic as of 03/15/22.   Frequent UTI     h/o since hysterectomy/04/01/13 asymptomatic   GERD (gastroesophageal reflux disease)    Takes Omeprazole daily as of 03/15/22.   Headache(784.0)    migraines   Hypertension    Currently on Lisinopril. Follows w/ PCP Dr. Blane Ohara, Theron Arista 12/19/21 as of 03/15/22.   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 Overview:  The patient in 09/2009 underwent TAHBSO with the findings of a ruptured 10 cm right ovarian mass. 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   Past Surgical History:  Procedure Laterality Date   ABDOMINAL HYSTERECTOMY  2011   Stromal ovarian cancer   ANTERIOR AND POSTERIOR REPAIR WITH SACROSPINOUS FIXATION N/A 03/27/2022   Procedure: ANTERIOR REPAIR WITH SACROSPINOUS FIXATION;  Surgeon: Marguerita Beards, MD;  Location: [facility];  Service: Gynecology;  Laterality: N/A;   CHOLECYSTECTOMY  1989   COLONOSCOPY  2010   no polyps per pt   CYSTOSCOPY N/A 03/27/2022   Procedure: CYSTOSCOPY;  Surgeon: Marguerita Beards, MD;  Location[facility];  Service: Gynecology;  Laterality: N/A;   ENTEROCELE REPAIR     around 2007   KNEE ARTHROSCOPY Right    many yrs ago before knee replacemnt   KNEE  ARTHROSCOPY Right 07/01/2014   Procedure: RIGHT ARTHROSCOPY KNEE WITH SYNOVECTOMY;  Surgeon: Loanne Drilling, MD;  Location: [facility];  Service: Orthopedics;  Laterality: Right;   RECTOCELE REPAIR     around 2007   TONSILLECTOMY  1975   TOTAL KNEE ARTHROPLASTY Right 04/07/2013   Procedure: RIGHT TOTAL KNEE ARTHROPLASTY;  Surgeon: Loanne Drilling, MD;  Location: [facility];  Service: Orthopedics;  Laterality: Right;    Family History  Problem Relation Age of Onset   Hyperlipidemia Mother    Hypertension Mother    Transient ischemic attack Mother    Hyperlipidemia Father    Hypertension Father    Lung cancer Father    BRCA 1/2 Maternal Grandmother    Social History   Socioeconomic History   Marital status: Married    Spouse name: Not on file   Number of children: Not on file   Years of education: Not on file   Highest education level: Some college, no degree  Occupational History   Not on file  Tobacco Use   Smoking status: Never   Smokeless tobacco: Never  Vaping Use   Vaping status: Never Used  Substance and Sexual Activity   Alcohol use: No   Drug use: No   Sexual activity: Yes    Birth control/protection: Surgical    Comment: hysterectomy  Other Topics Concern   Not on file  Social History Narrative   Not on file   Social Drivers of Health   Financial Resource Strain: Low Risk  (09/24/2023)   Overall Financial Resource Strain (CARDIA)    Difficulty of Paying Living Expenses: Not hard at all  Food Insecurity: No Food Insecurity (09/24/2023)   Hunger Vital Sign    Worried About Running Out of Food in the Last Year: Never true    Ran Out of Food in the Last Year: Never true  Transportation Needs: No Transportation Needs (09/24/2023)   PRAPARE - Administrator, Civil Service (Medical): No    Lack of Transportation (Non-Medical): No  Physical Activity: Insufficiently Active (09/24/2023)   Exercise Vital Sign    Days of Exercise per Week: 3 days    Minutes of  Exercise per Session: 20 min  Stress: No Stress Concern Present (09/24/2023)   [employer] of Occupational Health - Occupational Stress Questionnaire    Feeling of Stress : Only a little  Social Connections: Socially Integrated (09/24/2023)   Social Connection and Isolation Panel [NHANES]    Frequency of Communication with Friends and Family: More than three times a week    Frequency of Social Gatherings with Friends and Family: Once a week    Attends Religious Services: More than 4 times per year    Active Member of [employer] or Organizations: Yes    Attends Engineer, structural: More than 4 times per year    Marital Status: Married    Objective:  BP 124/86   Pulse 82   Temp 98.2 F (36.8 C)   Ht 5\' 4"  (1.626 m)   Wt 157 lb (71.2 kg)   SpO2 98%   BMI 26.95 kg/m      09/24/2023    1:24 PM 06/15/2023   11:13 AM 05/03/2023    7:59 AM  BP/Weight  Systolic BP 124 126 144  Diastolic BP 86 78 100  Wt. (Lbs) 157 157   BMI 26.95 kg/m2 26.95 kg/m2     Physical Exam Vitals reviewed.  Constitutional:      Appearance: Normal appearance. She is normal weight.  Neck:     Vascular: No carotid bruit.  Cardiovascular:     Rate and Rhythm: Normal rate and regular rhythm.     Heart sounds: Normal heart sounds.  Pulmonary:     Effort: Pulmonary effort is normal. No respiratory distress.     Breath sounds: Normal breath sounds.  Abdominal:     General: Abdomen is flat. Bowel sounds are normal.     Palpations: Abdomen is soft.     Tenderness: There is no abdominal tenderness.  Neurological:     Mental Status: She is alert and oriented to person, place, and time.  Psychiatric:        Mood and Affect: Mood normal.        Behavior: Behavior normal.     Diabetic Foot Exam - Simple   Simple Foot Form Diabetic Foot exam was performed with the following findings: Yes 09/24/2023  1:54 PM  Visual Inspection No deformities, no ulcerations, no other skin breakdown bilaterally:  Yes Sensation Testing Intact to touch and monofilament testing bilaterally: Yes Pulse Check Posterior Tibialis and Dorsalis pulse intact bilaterally: Yes Comments  Lab Results  Component Value Date   WBC 5.9 09/19/2023   HGB 13.6 09/19/2023   HCT 42.8 09/19/2023   PLT 454 (H) 09/19/2023   GLUCOSE 92 09/19/2023   CHOL 146 09/19/2023   TRIG 96 09/19/2023   HDL 73 09/19/2023   LDLCALC 55 09/19/2023   ALT 15 09/19/2023   AST 19 09/19/2023   NA 136 09/19/2023   K 5.2 09/19/2023   CL 99 09/19/2023   CREATININE 0.87 09/19/2023   BUN 19 09/19/2023   CO2 20 09/19/2023   INR 0.92 04/01/2013   HGBA1C 6.1 (H) 09/19/2023   MICROALBUR 10 06/29/2021      Assessment & Plan:    Hypertension associated with diabetes (HCC) Assessment & Plan: Stable on current regimen of Lisinopril and Diltiazem. Discussed potential to decrease Lisinopril to once daily dosing. -Reduce Lisinopril to once daily dosing. -Monitor blood pressure at home. -Check blood pressure in office in a few weeks. Decrease lisinopril 40 once daily at night.  Hold aspirin.   Diabetic glomerulopathy (HCC) Assessment & Plan: Control: excellent Recommend check sugars fasting daily. Recommend check feet daily. Recommend annual eye exams. Medicines: continue Synjardy XR 12.01/999 mg 2 daily Ozempic 0.5 mg weekly and aspirin 81 mg daily. Continue to work on eating a healthy diet and exercise.  Labs reviewed.   Mixed hyperlipidemia Assessment & Plan: Well controlled.  No changes to medicines. Continue pravastatin 10 mg daily and lovaza 1 gm 2 capsules twice daily.  Continue to work on eating a healthy diet and exercise.  Labs drawn today.     Migraine without aura and without status migrainosus, not intractable Assessment & Plan: Ongoing struggle with migraines. Previous trials of [place], [place], [place], [place] with limited success. Aimovig prescribed but not yet started due to insurance  issues. -Complete prior authorization for Aimovig. -Transfer Aimovig prescription to [facility].   Gastroesophageal reflux disease without esophagitis Assessment & Plan: The current medical regimen is effective;  continue present plan and medications. Continue omeprazole 20 mg once daily.    Primary insomnia Assessment & Plan: Well-controlled on Ambien CR 12.5 mg nightly   Elevated platelet count    No orders of the defined types were placed in this encounter.   No orders of the defined types were placed in this encounter.    Follow-up: Return in about 6 months (around 03/23/2024) for chronic follow up, BP CHECK and lab visit for cbc in 2-3 weeks.Clayborn Bigness I Leal-Borjas,acting as a scribe for Blane Ohara, MD.,have documented all relevant documentation on the behalf of Blane Ohara, MD,as directed by  Blane Ohara, MD while in the presence of Blane Ohara, MD.   An After Visit Summary was printed and given to the patient.  I attest that I have reviewed this visit and agree with the plan scribed by my staff.   Blane Ohara, MD [facility] [phone]

## 2023-09-24 NOTE — Patient Instructions (Addendum)
VISIT SUMMARY:  You came in today for a routine follow-up appointment. We discussed your current health status, including your hypertension, hyperlipidemia, and migraines. You reported no new symptoms and have been following your medication regimen. We also talked about your recent decision to retire and how it might positively impact your health.  YOUR PLAN:  -ELEVATED PLATELETS: Your recent blood work showed mildly elevated platelets, which are cells that help with blood clotting. This is likely incidental, and we will recheck your platelet count in one month to ensure everything is normal.  -HYPERTENSION: Your blood pressure is stable with your current medications. We will reduce your Lisinopril to once daily and monitor your blood pressure at home. Please check your blood pressure in our office in a few weeks to ensure it remains stable.  -MIGRAINES: You continue to experience migraines despite trying several medications. We will complete the prior authorization for Aimovig and transfer the prescription to [facility] to help manage your migraines more effectively.  -GENERAL HEALTH MAINTENANCE: Continue taking Synjardy, Ozempic 0.5, Pravastatin, Lovaza, and Aspirin as prescribed.Additionally, we will request your recent eye exam report from Dr. Precious Bard and follow up on your delayed bone density scan results.  INSTRUCTIONS:  Please recheck your platelet count in one month. Monitor your blood pressure at home and come in for a blood pressure check in a few weeks. We will also complete the prior authorization for Aimovig and transfer the prescription to [facility].

## 2023-09-24 NOTE — Assessment & Plan Note (Signed)
Well-controlled on Ambien CR 12.5 mg nightly ?

## 2023-09-24 NOTE — Assessment & Plan Note (Signed)
The current medical regimen is effective;  continue present plan and medications. Continue omeprazole 20 mg once daily.  

## 2023-09-24 NOTE — Assessment & Plan Note (Addendum)
Stable on current regimen of Lisinopril and Diltiazem. Discussed potential to decrease Lisinopril to once daily dosing. -Reduce Lisinopril to once daily dosing. -Monitor blood pressure at home. -Check blood pressure in office in a few weeks. Decrease lisinopril 40 once daily at night.  Hold aspirin.

## 2023-09-25 ENCOUNTER — Encounter: Payer: Self-pay | Admitting: Family Medicine

## 2023-09-25 ENCOUNTER — Other Ambulatory Visit: Payer: Self-pay | Admitting: Family Medicine

## 2023-09-25 ENCOUNTER — Other Ambulatory Visit: Payer: Self-pay

## 2023-09-25 DIAGNOSIS — M810 Age-related osteoporosis without current pathological fracture: Secondary | ICD-10-CM | POA: Diagnosis not present

## 2023-09-25 DIAGNOSIS — Z1231 Encounter for screening mammogram for malignant neoplasm of breast: Secondary | ICD-10-CM

## 2023-09-25 DIAGNOSIS — E1159 Type 2 diabetes mellitus with other circulatory complications: Secondary | ICD-10-CM

## 2023-09-25 MED ORDER — DILTIAZEM HCL ER 120 MG PO CP24: 120.0000 mg | ORAL_CAPSULE | Freq: Every day | ORAL | 0 refills | Status: DC

## 2023-09-27 ENCOUNTER — Other Ambulatory Visit: Payer: Self-pay

## 2023-09-27 DIAGNOSIS — E1121 Type 2 diabetes mellitus with diabetic nephropathy: Secondary | ICD-10-CM

## 2023-09-28 MED ORDER — LISINOPRIL 40 MG PO TABS: 40.0000 mg | ORAL_TABLET | Freq: Two times a day (BID) | ORAL | 1 refills | Status: DC

## 2023-10-02 ENCOUNTER — Encounter: Payer: Self-pay | Admitting: Family Medicine

## 2023-10-08 ENCOUNTER — Ambulatory Visit (INDEPENDENT_AMBULATORY_CARE_PROVIDER_SITE_OTHER)
Admission: RE | Admit: 2023-10-08 | Discharge: 2023-10-08 | Disposition: A | Discharge status: Home / Self Care | Payer: BC Managed Care – PPO | Source: Ambulatory Visit | Attending: Family Medicine | Admitting: Family Medicine

## 2023-10-08 ENCOUNTER — Encounter: Payer: Self-pay | Admitting: Family Medicine

## 2023-10-08 ENCOUNTER — Encounter: Payer: Self-pay | Admitting: Physician Assistant

## 2023-10-08 ENCOUNTER — Ambulatory Visit: Payer: BC Managed Care – PPO | Admitting: Family Medicine

## 2023-10-08 VITALS — BP 136/88 | HR 65 | Temp 97.6°F | Ht 64.0 in | Wt 160.0 lb

## 2023-10-08 DIAGNOSIS — R1032 Left lower quadrant pain: Secondary | ICD-10-CM

## 2023-10-08 DIAGNOSIS — R300 Dysuria: Secondary | ICD-10-CM

## 2023-10-08 DIAGNOSIS — R3129 Other microscopic hematuria: Secondary | ICD-10-CM | POA: Diagnosis not present

## 2023-10-08 DIAGNOSIS — R7989 Other specified abnormal findings of blood chemistry: Secondary | ICD-10-CM

## 2023-10-08 DIAGNOSIS — R1905 Periumbilic swelling, mass or lump: Secondary | ICD-10-CM

## 2023-10-08 DIAGNOSIS — K5730 Diverticulosis of large intestine without perforation or abscess without bleeding: Secondary | ICD-10-CM | POA: Diagnosis not present

## 2023-10-08 LAB — CBC WITH DIFFERENTIAL/PLATELET
Basophils Absolute: 0.1 10*3/uL (ref 0.0–0.2)
Basos: 1 %
EOS (ABSOLUTE): 0.3 10*3/uL (ref 0.0–0.4)
Eos: 6 %
Hematocrit: 42.3 % (ref 34.0–46.6)
Hemoglobin: 13.2 g/dL (ref 11.1–15.9)
Immature Grans (Abs): 0 10*3/uL (ref 0.0–0.1)
Immature Granulocytes: 0 %
Lymphocytes Absolute: 1.9 10*3/uL (ref 0.7–3.1)
Lymphs: 34 %
MCH: 25.9 pg — ABNORMAL LOW (ref 26.6–33.0)
MCHC: 31.2 g/dL — ABNORMAL LOW (ref 31.5–35.7)
MCV: 83 fL (ref 79–97)
Monocytes Absolute: 0.5 10*3/uL (ref 0.1–0.9)
Monocytes: 9 %
Neutrophils Absolute: 2.8 10*3/uL (ref 1.4–7.0)
Neutrophils: 50 %
Platelets: 434 10*3/uL (ref 150–450)
RBC: 5.1 x10E6/uL (ref 3.77–5.28)
RDW: 13.9 % (ref 11.7–15.4)
WBC: 5.5 10*3/uL (ref 3.4–10.8)

## 2023-10-08 LAB — POCT URINALYSIS DIP (CLINITEK)
Bilirubin, UA: NEGATIVE
Glucose, UA: >=1000 mg/dL — AB
Ketones, POC UA: NEGATIVE mg/dL
Leukocytes, UA: NEGATIVE
Nitrite, UA: NEGATIVE
POC PROTEIN,UA: NEGATIVE
Spec Grav, UA: 1.015 (ref 1.010–1.025)
Urobilinogen, UA: 0.2 U/dL — AB
pH, UA: 5 (ref 5.0–8.0)

## 2023-10-08 NOTE — Progress Notes (Signed)
Acute Office Visit  Subjective:    Patient ID: Lindsey [name], female    DOB: September 27, 1955, 68 y.o.   MRN: [id number]  Chief Complaint  Patient presents with   Urinary Tract Infection    HPI: Urinary symptoms  She reports new onset dysuria and urinary frequency. The current episode started a few days ago and is gradually worsening. Patient states symptoms are 5/10 in intensity, occurring every day. She  has not been recently treated for similar symptoms.       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 Blane Ohara, LOV 12/19/21 in Epic as of 03/15/22.   Dyslipidemia associated with type 2 diabetes mellitus [facility])    Patient follows with Dr. Blane Ohara, LOV 12/19/21 in Epic as of 03/15/22.   Frequent UTI     h/o since hysterectomy/04/01/13 asymptomatic   GERD (gastroesophageal reflux disease)    Takes Omeprazole daily as of 03/15/22.   Headache(784.0)    migraines   Hypertension    Currently on Lisinopril. Follows w/ PCP Dr. Blane Ohara, Theron Arista 12/19/21 as of 03/15/22.   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 Overview:  The patient in 09/2009 underwent TAHBSO with the findings of a ruptured 10 cm right ovarian mass. 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    Past Surgical History:  Procedure Laterality Date   ABDOMINAL HYSTERECTOMY  2011   Stromal ovarian cancer   ANTERIOR AND POSTERIOR REPAIR WITH SACROSPINOUS FIXATION N/A 03/27/2022   Procedure: ANTERIOR REPAIR WITH SACROSPINOUS FIXATION;  Surgeon: Marguerita Beards, MD;  Location: [facility];  Service: Gynecology;  Laterality: N/A;   CHOLECYSTECTOMY   1989   COLONOSCOPY  2010   no polyps per pt   CYSTOSCOPY N/A 03/27/2022   Procedure: CYSTOSCOPY;  Surgeon: Marguerita Beards, MD;  Location: [facility];  Service: Gynecology;  Laterality: N/A;   ENTEROCELE REPAIR     around 2007   KNEE ARTHROSCOPY Right    many yrs ago before knee replacemnt   KNEE ARTHROSCOPY Right 07/01/2014   Procedure: RIGHT ARTHROSCOPY KNEE WITH SYNOVECTOMY;  Surgeon: Loanne Drilling, MD;  Location: [facility];  Service: Orthopedics;  Laterality: Right;   RECTOCELE REPAIR     around 2007   TONSILLECTOMY  1975   TOTAL KNEE ARTHROPLASTY Right 04/07/2013   Procedure: RIGHT TOTAL KNEE ARTHROPLASTY;  Surgeon: Loanne Drilling, MD;  Location: [facility];  Service: Orthopedics;  Laterality: Right;    Family History  Problem Relation Age of Onset   Hyperlipidemia Mother    Hypertension Mother    Transient ischemic attack Mother    Hyperlipidemia Father    Hypertension Father    Lung cancer Father    BRCA 1/2 Maternal Grandmother     Social History   Socioeconomic History   Marital status: Married    Spouse name: Not on file   Number of children: Not on file   Years of education: Not on file   Highest education level: Some college, no degree  Occupational History   Not on file  Tobacco Use  Smoking status: Never   Smokeless tobacco: Never  Vaping Use   Vaping status: Never Used  Substance and Sexual Activity   Alcohol use: No   Drug use: No   Sexual activity: Yes    Birth control/protection: Surgical    Comment: hysterectomy  Other Topics Concern   Not on file  Social History Narrative   Not on file   Social Drivers of Health   Financial Resource Strain: Low Risk  (09/24/2023)   Overall Financial Resource Strain (CARDIA)    Difficulty of Paying Living Expenses: Not hard at all  Food Insecurity: No Food Insecurity (09/24/2023)   Hunger Vital Sign    Worried About Running Out of Food in the Last Year: Never true    Ran Out of Food in  the Last Year: Never true  Transportation Needs: No Transportation Needs (09/24/2023)   PRAPARE - Administrator, Civil Service (Medical): No    Lack of Transportation (Non-Medical): No  Physical Activity: Insufficiently Active (09/24/2023)   Exercise Vital Sign    Days of Exercise per Week: 3 days    Minutes of Exercise per Session: 20 min  Stress: No Stress Concern Present (09/24/2023)   [employer] of Occupational Health - Occupational Stress Questionnaire    Feeling of Stress : Only a little  Social Connections: Socially Integrated (09/24/2023)   Social Connection and Isolation Panel [NHANES]    Frequency of Communication with Friends and Family: More than three times a week    Frequency of Social Gatherings with Friends and Family: Once a week    Attends Religious Services: More than 4 times per year    Active Member of [employer] or Organizations: Yes    Attends Engineer, structural: More than 4 times per year    Marital Status: Married  Catering manager Violence: Not At Risk (06/15/2023)   Humiliation, Afraid, Rape, and Kick questionnaire    Fear of Current or Ex-Partner: No    Emotionally Abused: No    Physically Abused: No    Sexually Abused: No    Outpatient Medications Prior to Visit  Medication Sig Dispense Refill   aspirin EC 81 MG tablet Take 81 mg by mouth every morning.     Calcium Carb-Cholecalciferol (CALCIUM 600 + D PO) Take 1 tablet by mouth at bedtime.     diltiazem (DILT-XR) 120 MG 24 hr capsule Take 1 capsule (120 mg total) by mouth daily. 90 capsule 0   Erenumab-aooe (AIMOVIG) 140 MG/ML SOAJ Inject 140 mg into the skin every 30 (thirty) days. 3 mL 3   glucose blood (CONTOUR NEXT TEST) test strip Use as instructed 100 each 3   Lancet Devices (MICROLET NEXT LANCING DEVICE) MISC 1 each by Does not apply route in the morning. 1 each 0   lisinopril (ZESTRIL) 40 MG tablet Take 1 tablet (40 mg total) by mouth 2 (two) times daily. 180 tablet 1    LORazepam (ATIVAN) 0.5 MG tablet Take 1 tablet (0.5 mg total) by mouth 2 (two) times daily as needed for anxiety. 30 tablet 1   Magnesium 250 MG TABS Take by mouth in the morning and at bedtime.     omega-3 acid ethyl esters (LOVAZA) 1 g capsule TAKE 2 CAPSULES BY MOUTH TWICE  DAILY 360 capsule 3   omeprazole (PRILOSEC) 20 MG capsule TAKE 1 CAPSULE BY MOUTH ONCE  DAILY BEFORE A MEAL 90 capsule 3   ondansetron (ZOFRAN) 4 MG tablet Take 1 tablet (  4 mg total) by mouth every 8 (eight) hours as needed for nausea or vomiting. 40 tablet 0   pravastatin (PRAVACHOL) 10 MG tablet TAKE 1 TABLET BY MOUTH ONCE  DAILY 90 tablet 3   Probiotic Product (PROBIOTIC BLEND PO) Take by mouth daily.     Semaglutide,0.25 or 0.5MG /DOS, (OZEMPIC, 0.25 OR 0.5 MG/DOSE,) 2 MG/3ML SOPN Inject 0.5 mg into the skin once a week. 9 mL 1   SUMAtriptan (IMITREX) 100 MG tablet TAKE 1 TABLET BY MOUTH AT ONSET  OF MIGRAINE; MAY REPEAT IN 2  HOURS AS NEEDED. MAX OF 2  TABLETS DAILY 27 tablet 1   SYNJARDY XR 12.01-999 MG TB24 TAKE 2 TABLETS BY MOUTH DAILY 180 tablet 3   zolpidem (AMBIEN CR) 12.5 MG CR tablet TAKE 1 TABLET BY MOUTH AT  BEDTIME 90 tablet 1   No facility-administered medications prior to visit.    Allergies  Allergen Reactions   Amoxicillin Hives   Trulicity [Dulaglutide] Nausea And Vomiting   Citalopram Other (See Comments)    headache   Erythromycin Hives and Rash   Hydrocodone Other (See Comments)    Achy all over    Sertraline Other (See Comments)    irritability   Sulfa Antibiotics Rash    fever   Tramadol Nausea Only and Palpitations   Trazodone And Nefazodone Other (See Comments)    Insomnia    Venlafaxine Other (See Comments)    migrane     Review of Systems  Constitutional:  Negative for appetite change, fatigue and fever.  HENT:  Negative for congestion, ear pain, sinus pressure and sore throat.   Respiratory:  Negative for cough, chest tightness, shortness of breath and wheezing.    Cardiovascular:  Negative for chest pain and palpitations.  Gastrointestinal:  Negative for abdominal pain, constipation, diarrhea, nausea and vomiting.  Genitourinary:  Positive for dysuria and frequency. Negative for hematuria.  Musculoskeletal:  Negative for arthralgias, back pain, joint swelling and myalgias.  Skin:  Negative for rash.  Neurological:  Negative for dizziness, weakness and headaches.  Psychiatric/Behavioral:  Negative for dysphoric mood. The patient is not nervous/anxious.        Objective:        10/08/2023    8:19 AM 09/24/2023    1:24 PM 06/15/2023   11:13 AM  Vitals with BMI  Height 5\' 4"  5\' 4"  5\' 4"   Weight 160 lbs 157 lbs 157 lbs  BMI 27.45 26.94 26.94  Systolic 136 124 329  Diastolic 88 86 78  Pulse 65 82 73    No data found.   Physical Exam Vitals reviewed.  Constitutional:      Appearance: Normal appearance. She is normal weight.  Cardiovascular:     Rate and Rhythm: Normal rate and regular rhythm.     Heart sounds: Normal heart sounds.  Pulmonary:     Effort: Pulmonary effort is normal. No respiratory distress.     Breath sounds: Normal breath sounds.  Abdominal:     General: Abdomen is flat. Bowel sounds are normal.     Palpations: Abdomen is soft. There is mass (periumbilical).     Tenderness: There is abdominal tenderness (suprapubic and LLQ moderate).  Neurological:     Mental Status: She is alert and oriented to person, place, and time.  Psychiatric:        Mood and Affect: Mood normal.        Behavior: Behavior normal.     Health Maintenance Due  Topic Date  Due   Hepatitis C Screening  Never done   OPHTHALMOLOGY EXAM  03/10/2022   DEXA SCAN  Never done    There are no preventive care reminders to display for this patient.   No results found for: "TSH" Lab Results  Component Value Date   WBC 5.5 10/08/2023   HGB 13.2 10/08/2023   HCT 42.3 10/08/2023   MCV 83 10/08/2023   PLT 434 10/08/2023   Lab Results  Component  Value Date   NA 136 09/19/2023   K 5.2 09/19/2023   CO2 20 09/19/2023   GLUCOSE 92 09/19/2023   BUN 19 09/19/2023   CREATININE 0.87 09/19/2023   BILITOT 0.6 09/19/2023   ALKPHOS 76 09/19/2023   AST 19 09/19/2023   ALT 15 09/19/2023   PROT 6.8 09/19/2023   ALBUMIN 4.4 09/19/2023   CALCIUM 10.1 09/19/2023   ANIONGAP 15 06/25/2014   EGFR 73 09/19/2023   Lab Results  Component Value Date   CHOL 146 09/19/2023   Lab Results  Component Value Date   HDL 73 09/19/2023   Lab Results  Component Value Date   LDLCALC 55 09/19/2023   Lab Results  Component Value Date   TRIG 96 09/19/2023   Lab Results  Component Value Date   CHOLHDL 2.0 09/19/2023   Lab Results  Component Value Date   HGBA1C 6.1 (H) 09/19/2023       Assessment & Plan:  Dysuria Assessment & Plan: Recommended azo.  Sent urine culture just in case.   Orders: -     POCT URINALYSIS DIP (CLINITEK) -     Urine Culture  Elevated platelet count Assessment & Plan: Check cbc.   Orders: -     CBC with Differential/Platelet  Periumbilical mass Assessment & Plan: Order stat ct abdomen/pelvis Check cbc.   Left lower quadrant abdominal pain Assessment & Plan: Order stat ct abdomen/pelvis Check cbc.  Orders: -     CT ABDOMEN PELVIS WO CONTRAST; Future  Other orders -     Ciprofloxacin HCl; Take 1 tablet (250 mg total) by mouth 2 (two) times daily for 7 days.  Dispense: 14 tablet; Refill: 0     Meds ordered this encounter  Medications   ciprofloxacin (CIPRO) 250 MG tablet    Sig: Take 1 tablet (250 mg total) by mouth 2 (two) times daily for 7 days.    Dispense:  14 tablet    Refill:  0    Orders Placed This Encounter  Procedures   Urine Culture   CT ABDOMEN PELVIS WO CONTRAST   POCT URINALYSIS DIP (CLINITEK)     Follow-up: No follow-ups on file.  An After Visit Summary was printed and given to the patient.    I,Lauren M Auman,acting as a scribe for Blane Ohara, MD.,have documented  all relevant documentation on the behalf of Blane Ohara, MD,as directed by  Blane Ohara, MD while in the presence of Blane Ohara, MD.    Blane Ohara, MD [name] [phone]

## 2023-10-12 LAB — URINE CULTURE

## 2023-10-13 MED ORDER — CEPHALEXIN 500 MG PO CAPS
500.0000 mg | ORAL_CAPSULE | Freq: Three times a day (TID) | ORAL | 0 refills | Status: AC
Start: 2023-10-13 — End: 2023-10-20

## 2023-10-21 ENCOUNTER — Encounter: Payer: Self-pay | Admitting: Family Medicine

## 2023-10-21 DIAGNOSIS — R1032 Left lower quadrant pain: Secondary | ICD-10-CM | POA: Insufficient documentation

## 2023-10-21 MED ORDER — CIPROFLOXACIN HCL 250 MG PO TABS: 250.0000 mg | ORAL_TABLET | Freq: Two times a day (BID) | ORAL | 0 refills | Status: AC

## 2023-10-21 NOTE — Assessment & Plan Note (Signed)
Recommended azo.  Sent urine culture just in case.

## 2023-10-21 NOTE — Assessment & Plan Note (Signed)
Order stat ct abdomen/pelvis Check cbc.

## 2023-10-21 NOTE — Assessment & Plan Note (Signed)
 Check cbc

## 2023-11-14 ENCOUNTER — Ambulatory Visit
Admission: RE | Admit: 2023-11-14 | Discharge: 2023-11-14 | Disposition: A | Discharge status: Home / Self Care | Payer: BC Managed Care – PPO | Source: Ambulatory Visit | Attending: Family Medicine | Admitting: Family Medicine

## 2023-11-14 DIAGNOSIS — Z1231 Encounter for screening mammogram for malignant neoplasm of breast: Secondary | ICD-10-CM

## 2023-11-17 ENCOUNTER — Other Ambulatory Visit: Payer: Self-pay | Admitting: Family Medicine

## 2023-11-17 DIAGNOSIS — I152 Hypertension secondary to endocrine disorders: Secondary | ICD-10-CM

## 2023-11-17 DIAGNOSIS — G43009 Migraine without aura, not intractable, without status migrainosus: Secondary | ICD-10-CM

## 2023-11-18 ENCOUNTER — Encounter: Payer: Self-pay | Admitting: Family Medicine

## 2023-11-27 ENCOUNTER — Other Ambulatory Visit: Payer: Self-pay | Admitting: Family Medicine

## 2023-11-30 ENCOUNTER — Other Ambulatory Visit: Payer: Self-pay | Admitting: Family Medicine

## 2023-12-03 ENCOUNTER — Encounter: Payer: Self-pay | Admitting: Physician Assistant

## 2023-12-03 ENCOUNTER — Telehealth: Payer: Self-pay | Admitting: Family Medicine

## 2023-12-03 ENCOUNTER — Ambulatory Visit: Admitting: Physician Assistant

## 2023-12-03 VITALS — BP 138/82 | HR 62 | Temp 98.1°F | Resp 16 | Ht 64.0 in | Wt 159.6 lb

## 2023-12-03 DIAGNOSIS — I152 Hypertension secondary to endocrine disorders: Secondary | ICD-10-CM | POA: Diagnosis not present

## 2023-12-03 DIAGNOSIS — E1159 Type 2 diabetes mellitus with other circulatory complications: Secondary | ICD-10-CM | POA: Diagnosis not present

## 2023-12-03 MED ORDER — DILTIAZEM HCL ER 240 MG PO CP24: 240.0000 mg | ORAL_CAPSULE | Freq: Every day | ORAL | Status: DC

## 2023-12-03 NOTE — Telephone Encounter (Signed)
 Copied from CRM [id number]. Topic: Clinical - Prescription Issue >> Dec 03, 2023  3:55 PM Clayton Bibles wrote: Reason for CRM:  Attn: Marianne Sofia, PA-C Trudy is calling to let us know that she has a 90 days supply of diltiazem (DILACOR XR) 240 MG 24 hr capsule. The medication does not need to be called in tomorrow. Thanks

## 2023-12-03 NOTE — Telephone Encounter (Signed)
 I called to clarify with pt ---- she actually has 90 days of diltiazem 120mg  (to take 2 po qd) --- not the 240mg 

## 2023-12-03 NOTE — Telephone Encounter (Unsigned)
 Copied from CRM [id number]. Topic: Clinical - Prescription Issue >> Dec 03, 2023  3:55 PM Clayton Bibles wrote: Reason for CRM:  Attn: Marianne Sofia, PA-C Trudy is calling to let us know that she has a 90 days supply of diltiazem (DILACOR XR) 240 MG 24 hr capsule. The medication does not need to be called in tomorrow. Thanks

## 2023-12-03 NOTE — Progress Notes (Signed)
 Subjective:  Patient ID: Lindsey [name], female    DOB: [birth year]  Age: 68 y.o. MRN: [id number]  Chief Complaint  Patient presents with  Hypertension  HPI Pt in today for blood pressure follow up.  Pt states at home recently she had been getting bp readings up to 160s/low 100s with average of 150s/90s.  She has had some mild tiredness but denies chest pain or shortness of breath.  Denies edema.  She does bring cuff with her today and her reading is compatible to ours.  BP our cuff is 138/82 and hers is 143/88.  She went to ED last week with same concerns and at that time bp was 148/97 She is currently on zestril 40mg  qd (but for a few days she has taken bid) and cardizem XR 120mg  qd     12/03/2023    9:45 AM 06/15/2023   11:22 AM 05/03/2023    7:27 AM 04/28/2022    7:39 AM 08/20/2021    8:30 PM  Depression screen PHQ 2/9  Decreased Interest 0 0 0 0 0  Down, Depressed, Hopeless 0 0 0 0 0  PHQ - 2 Score 0 0 0 0 0  Altered sleeping 0 0     Tired, decreased energy 0 1     Change in appetite 0 0     Feeling bad or failure about yourself  0 0     Trouble concentrating 0 0     Moving slowly or fidgety/restless 0 0     Suicidal thoughts 0 0     PHQ-9 Score 0 1     Difficult doing work/chores Not difficult at all Not difficult at all           10/28/2020    1:35 PM 01/28/2021   11:13 AM 11/02/2022    7:31 AM 05/03/2023    7:27 AM 12/03/2023    9:44 AM  Fall Risk  Falls in the past year? 0 0 0 0 0  Was there an injury with Fall? 0 0 0 0 0  Fall Risk Category Calculator 0 0 0 0 0  Fall Risk Category (Retired) Low Low     (RETIRED) Patient Fall Risk Level Low fall risk Low fall risk     Patient at Risk for Falls Due to No Fall Risks No Fall Risks No Fall Risks No Fall Risks No Fall Risks  Fall risk Follow up Falls evaluation completed Falls evaluation completed Falls evaluation completed Falls evaluation completed;Falls prevention discussed Falls evaluation completed      ROS CONSTITUTIONAL: see HPI  CARDIOVASCULAR: Negative for chest pain, dizziness, palpitations and pedal edema.  RESPIRATORY: Negative for recent cough and dyspnea.  GASTROINTESTINAL: Negative for abdominal pain, acid reflux symptoms, constipation, diarrhea, nausea and vomiting.  PSYCHIATRIC: Negative for sleep disturbance and to question depression screen.  Negative for depression, negative for anhedonia.    Current Outpatient Medications:    diltiazem (DILACOR XR) 240 MG 24 hr capsule, Take 1 capsule (240 mg total) by mouth daily., Disp: , Rfl:    AIMOVIG 140 MG/ML SOAJ, INJECT THE CONTENTS OF 1 PEN  SUBCUTANEOUSLY ONCE MONTHLY, Disp: 3 mL, Rfl: 3   aspirin EC 81 MG tablet, Take 81 mg by mouth every morning., Disp: , Rfl:    Calcium Carb-Cholecalciferol (CALCIUM 600 + D PO), Take 1 tablet by mouth at bedtime., Disp: , Rfl:    glucose blood (CONTOUR NEXT TEST) test strip, Use as instructed, Disp: 100 each, Rfl:  3   Lancet Devices (MICROLET NEXT LANCING DEVICE) MISC, 1 each by Does not apply route in the morning., Disp: 1 each, Rfl: 0   lisinopril (ZESTRIL) 40 MG tablet, Take 1 tablet (40 mg total) by mouth 2 (two) times daily., Disp: 180 tablet, Rfl: 1   LORazepam (ATIVAN) 0.5 MG tablet, Take 1 tablet (0.5 mg total) by mouth 2 (two) times daily as needed for anxiety., Disp: 30 tablet, Rfl: 1   Magnesium 250 MG TABS, Take by mouth in the morning and at bedtime., Disp: , Rfl:    omega-3 acid ethyl esters (LOVAZA) 1 g capsule, TAKE 2 CAPSULES BY MOUTH TWICE  DAILY, Disp: 360 capsule, Rfl: 3   omeprazole (PRILOSEC) 20 MG capsule, TAKE 1 CAPSULE BY MOUTH ONCE  DAILY BEFORE A MEAL, Disp: 90 capsule, Rfl: 3   ondansetron (ZOFRAN) 4 MG tablet, Take 1 tablet (4 mg total) by mouth every 8 (eight) hours as needed for nausea or vomiting., Disp: 40 tablet, Rfl: 0   pravastatin (PRAVACHOL) 10 MG tablet, TAKE 1 TABLET BY MOUTH ONCE  DAILY, Disp: 90 tablet, Rfl: 3   Probiotic Product (PROBIOTIC BLEND PO),  Take by mouth daily., Disp: , Rfl:    Semaglutide,0.25 or 0.5MG /DOS, (OZEMPIC, 0.25 OR 0.5 MG/DOSE,) 2 MG/3ML SOPN, Inject 0.5 mg into the skin once a week., Disp: 9 mL, Rfl: 1   SUMAtriptan (IMITREX) 100 MG tablet, TAKE 1 TABLET BY MOUTH AT ONSET  OF MIGRAINE; MAY REPEAT IN 2  HOURS AS NEEDED. 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 Blane Ohara, LOV 12/19/21 in Epic as of 03/15/22.   Dyslipidemia associated with type 2 diabetes mellitus [facility])    Patient follows with Dr. Blane Ohara, LOV 12/19/21 in Epic as of 03/15/22.   Frequent UTI     h/o since hysterectomy/04/01/13 asymptomatic   GERD (gastroesophageal reflux disease)    Takes Omeprazole daily as of 03/15/22.   Headache(784.0)    migraines   Hypertension    Currently on Lisinopril. Follows w/ PCP Dr. Blane Ohara, Theron Arista 12/19/21 as of 03/15/22.   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 Overview:  The patient in 09/2009 underwent TAHBSO with the findings of a ruptured 10 cm right ovarian mass. 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   Objective:  PHYSICAL EXAM:   VS: BP 138/82   Pulse 62   Temp 98.1 F (36.7 C) (Temporal)   Resp 16   Ht 5\' 4"  (1.626 m)   Wt 159 lb 9.6 oz (72.4 kg)   SpO2 98%   BMI 27.40 kg/m   GEN: Well nourished, well developed, in no acute distress   Cardiac: RRR; no murmurs, rubs, or gallops,no edema  - Respiratory:  normal respiratory rate and pattern with no distress - normal breath sounds  with no rales, rhonchi, wheezes or rubs  Psych: euthymic mood, appropriate affect and demeanor   Assessment & Plan:    Hypertension associated with diabetes (HCC) -     CBC with Differential/Platelet -     Comprehensive metabolic panel with GFR -     dilTIAZem HCl ER; Take 1 capsule (240 mg total) by mouth daily. Continue zestril 40mg  qd (not twice daily ----- if bp remains elevated after bp check in few weeks consider to add in hydrochlorothiazide)    Follow-up: Return in about 3 weeks (around 12/24/2023) for nurse visit BP check.  An After Visit Summary was printed and given to the patient.  Jettie Pagan [facility] [phone]

## 2023-12-04 ENCOUNTER — Encounter: Payer: Self-pay | Admitting: Physician Assistant

## 2023-12-04 LAB — COMPREHENSIVE METABOLIC PANEL WITH GFR
ALT: 12 IU/L (ref 0–32)
AST: 18 IU/L (ref 0–40)
Albumin: 4.4 g/dL (ref 3.9–4.9)
Alkaline Phosphatase: 81 IU/L (ref 44–121)
BUN/Creatinine Ratio: 22 (ref 12–28)
BUN: 18 mg/dL (ref 8–27)
Bilirubin Total: 0.3 mg/dL (ref 0.0–1.2)
CO2: 21 mmol/L (ref 20–29)
Calcium: 10.1 mg/dL (ref 8.7–10.3)
Chloride: 100 mmol/L (ref 96–106)
Creatinine, Ser: 0.82 mg/dL (ref 0.57–1.00)
Globulin, Total: 2.6 g/dL (ref 1.5–4.5)
Glucose: 94 mg/dL (ref 70–99)
Potassium: 4.7 mmol/L (ref 3.5–5.2)
Sodium: 136 mmol/L (ref 134–144)
Total Protein: 7 g/dL (ref 6.0–8.5)
eGFR: 79 mL/min/{1.73_m2} (ref >59)

## 2023-12-04 LAB — CBC WITH DIFFERENTIAL/PLATELET
Basophils Absolute: 0.1 10*3/uL (ref 0.0–0.2)
Basos: 1 %
EOS (ABSOLUTE): 0.2 10*3/uL (ref 0.0–0.4)
Eos: 2 %
Hematocrit: 42.6 % (ref 34.0–46.6)
Hemoglobin: 13.6 g/dL (ref 11.1–15.9)
Immature Grans (Abs): 0 10*3/uL (ref 0.0–0.1)
Immature Granulocytes: 0 %
Lymphocytes Absolute: 1.5 10*3/uL (ref 0.7–3.1)
Lymphs: 22 %
MCH: 25.8 pg — ABNORMAL LOW (ref 26.6–33.0)
MCHC: 31.9 g/dL (ref 31.5–35.7)
MCV: 81 fL (ref 79–97)
Monocytes Absolute: 0.5 10*3/uL (ref 0.1–0.9)
Monocytes: 8 %
Neutrophils Absolute: 4.4 10*3/uL (ref 1.4–7.0)
Neutrophils: 67 %
Platelets: 474 10*3/uL — ABNORMAL HIGH (ref 150–450)
RBC: 5.27 x10E6/uL (ref 3.77–5.28)
RDW: 14.5 % (ref 11.7–15.4)
WBC: 6.6 10*3/uL (ref 3.4–10.8)

## 2023-12-05 ENCOUNTER — Other Ambulatory Visit (HOSPITAL_COMMUNITY): Payer: Self-pay

## 2023-12-06 ENCOUNTER — Other Ambulatory Visit (HOSPITAL_COMMUNITY): Payer: Self-pay

## 2023-12-06 ENCOUNTER — Other Ambulatory Visit: Payer: Self-pay

## 2023-12-06 ENCOUNTER — Encounter: Payer: Self-pay | Admitting: Pharmacist

## 2023-12-06 MED ORDER — DILTIAZEM HCL ER 120 MG PO CP24
120.0000 mg | ORAL_CAPSULE | Freq: Every day | ORAL | 2 refills | Status: DC
  Filled 2023-12-06: qty 90, 90d supply, fill #0

## 2023-12-06 MED FILL — Omeprazole Cap Delayed Release 20 MG: ORAL | 90 days supply | Qty: 90 | Fill #0 | Status: CN

## 2023-12-06 MED FILL — Omega-3-acid Ethyl Esters Cap 1 GM: ORAL | 90 days supply | Qty: 360 | Fill #0 | Status: CN

## 2023-12-06 MED FILL — Erenumab-aooe Subcutaneous Soln Auto-Injector 140 MG/ML: SUBCUTANEOUS | 30 days supply | Qty: 1 | Fill #0 | Status: CN

## 2023-12-06 MED FILL — Empagliflozin-Metformin HCl Tab ER 24HR 12.5-1000 MG: ORAL | 90 days supply | Qty: 180 | Fill #0 | Status: CN

## 2023-12-06 MED FILL — Pravastatin Sodium Tab 10 MG: ORAL | 90 days supply | Qty: 90 | Fill #0 | Status: CN

## 2023-12-07 ENCOUNTER — Other Ambulatory Visit (HOSPITAL_COMMUNITY): Payer: Self-pay

## 2023-12-10 ENCOUNTER — Other Ambulatory Visit (HOSPITAL_BASED_OUTPATIENT_CLINIC_OR_DEPARTMENT_OTHER): Payer: Self-pay

## 2023-12-10 DIAGNOSIS — D485 Neoplasm of uncertain behavior of skin: Secondary | ICD-10-CM | POA: Diagnosis not present

## 2023-12-10 DIAGNOSIS — L570 Actinic keratosis: Secondary | ICD-10-CM | POA: Diagnosis not present

## 2023-12-10 DIAGNOSIS — B351 Tinea unguium: Secondary | ICD-10-CM | POA: Diagnosis not present

## 2023-12-10 MED ORDER — FLUCONAZOLE 200 MG PO TABS
200.0000 mg | ORAL_TABLET | ORAL | 0 refills | Status: DC
  Filled 2023-12-10: qty 24, 84d supply, fill #0

## 2023-12-11 ENCOUNTER — Telehealth: Payer: Self-pay | Admitting: Family Medicine

## 2023-12-11 NOTE — Telephone Encounter (Signed)
 [employer] Chronic Condition Verification

## 2023-12-18 ENCOUNTER — Telehealth: Payer: Self-pay

## 2023-12-18 NOTE — Telephone Encounter (Signed)
 Sent message via mychart for patient to update her insurance. Contact [employer] for pts member Id number. PA for ozempic initiated and submitted via covermymeds.  Copied from CRM [id number]. Topic: Clinical - Prescription Issue >> Dec 18, 2023  2:33 PM Donald Frost wrote: Reason for CRM: Lindsey [name] from [facility] called stating the patients insurance changed on April 1st to [employer] and he states the Semaglutide,0.25 or 0.5MG /DOS, (OZEMPIC, 0.25 OR 0.5 MG/DOSE,) 2 MG/3ML SOPN requires a prior authorization. It looks like we do not have the card in the system yet as she was last seen the day before her insurance changed. Please assist patient further. L[place]h with [employer] contact number is [phone] opt 2

## 2023-12-21 ENCOUNTER — Ambulatory Visit

## 2023-12-21 NOTE — Progress Notes (Signed)
 Patient is in office today for a nurse visit for Blood Pressure Check. Patient blood pressure was 102/68, Patient No chest pain, No shortness of breath, No dyspnea on exertion, No orthopnea, No paroxysmal nocturnal dyspnea, No edema, No palpitations, No syncope.   Patient stated if possible when doing her BP refill if she could get 90 day supply.

## 2023-12-31 ENCOUNTER — Other Ambulatory Visit: Payer: Self-pay | Admitting: Physician Assistant

## 2023-12-31 DIAGNOSIS — I152 Hypertension secondary to endocrine disorders: Secondary | ICD-10-CM

## 2023-12-31 MED ORDER — DILTIAZEM HCL ER 240 MG PO CP24
240.0000 mg | ORAL_CAPSULE | Freq: Every day | ORAL | 1 refills | Status: DC
  Filled 2023-12-31: qty 90, 90d supply, fill #0
  Filled 2024-03-20 (×2): qty 90, 90d supply, fill #1

## 2024-01-01 ENCOUNTER — Other Ambulatory Visit: Payer: Self-pay

## 2024-01-01 ENCOUNTER — Other Ambulatory Visit (HOSPITAL_COMMUNITY): Payer: Self-pay

## 2024-01-25 ENCOUNTER — Other Ambulatory Visit: Payer: Self-pay | Admitting: Family Medicine

## 2024-01-25 DIAGNOSIS — E1121 Type 2 diabetes mellitus with diabetic nephropathy: Secondary | ICD-10-CM

## 2024-01-31 ENCOUNTER — Other Ambulatory Visit: Payer: Self-pay | Admitting: Family Medicine

## 2024-01-31 DIAGNOSIS — Z1231 Encounter for screening mammogram for malignant neoplasm of breast: Secondary | ICD-10-CM

## 2024-02-09 ENCOUNTER — Other Ambulatory Visit: Payer: Self-pay | Admitting: Family Medicine

## 2024-02-11 ENCOUNTER — Other Ambulatory Visit (HOSPITAL_COMMUNITY): Payer: Self-pay

## 2024-02-11 MED FILL — Pravastatin Sodium Tab 10 MG: ORAL | 90 days supply | Qty: 90 | Fill #0 | Status: AC

## 2024-02-11 MED FILL — Omega-3-acid Ethyl Esters Cap 1 GM: ORAL | 90 days supply | Qty: 360 | Fill #0 | Status: AC

## 2024-02-11 MED FILL — Omeprazole Cap Delayed Release 20 MG: ORAL | 90 days supply | Qty: 90 | Fill #0 | Status: AC

## 2024-02-11 MED FILL — Empagliflozin-Metformin HCl Tab ER 24HR 12.5-1000 MG: ORAL | 90 days supply | Qty: 180 | Fill #0 | Status: AC

## 2024-02-12 ENCOUNTER — Telehealth: Payer: Self-pay | Admitting: Family Medicine

## 2024-02-12 ENCOUNTER — Other Ambulatory Visit (HOSPITAL_COMMUNITY): Payer: Self-pay

## 2024-02-12 MED ORDER — SUMATRIPTAN SUCCINATE 100 MG PO TABS
ORAL_TABLET | ORAL | 1 refills | Status: DC
  Filled 2024-02-12: qty 27, 14d supply, fill #0

## 2024-02-12 MED ORDER — ZOLPIDEM TARTRATE ER 12.5 MG PO TBCR
12.5000 mg | EXTENDED_RELEASE_TABLET | Freq: Every day | ORAL | 1 refills | Status: DC
  Filled 2024-02-12: qty 90, 90d supply, fill #0
  Filled 2024-05-12: qty 90, 90d supply, fill #1

## 2024-02-12 NOTE — Telephone Encounter (Signed)
 Copied from CRM [id number]. Topic: Clinical - Medication Refill >> Feb 12, 2024 10:02 AM Alpha Arts wrote: Medication: SUMAtriptan  (IMITREX ) 100 MG tablet  zolpidem  (AMBIEN  CR) 12.5 MG CR tablet   Has the patient contacted their pharmacy? No (Agent: If no, request that the patient contact the pharmacy for the refill. If patient does not wish to contact the pharmacy document the reason why and proceed with request.) (Agent: If yes, when and what did the pharmacy advise?)  This is the patient's preferred pharmacy:  [facility] [address] [place] [address] Phone: [phone] Fax: [phone]  Is this the correct pharmacy for this prescription? Yes If no, delete pharmacy and type the correct one.   Has the prescription been filled recently? Yes  Is the patient out of the medication? No  Has the patient been seen for an appointment in the last year OR does the patient have an upcoming appointment? Yes  Can we respond through MyChart? Yes  Agent: Please be advised that Rx refills may take up to 3 business days. We ask that you follow-up with your pharmacy.

## 2024-02-13 ENCOUNTER — Other Ambulatory Visit (HOSPITAL_COMMUNITY): Payer: Self-pay

## 2024-02-13 ENCOUNTER — Other Ambulatory Visit: Payer: Self-pay

## 2024-02-19 ENCOUNTER — Other Ambulatory Visit: Payer: Self-pay | Admitting: Family Medicine

## 2024-02-19 ENCOUNTER — Other Ambulatory Visit (HOSPITAL_COMMUNITY): Payer: Self-pay

## 2024-02-19 MED ORDER — SUMATRIPTAN SUCCINATE 100 MG PO TABS
ORAL_TABLET | ORAL | 1 refills | Status: AC
  Filled 2024-02-19: qty 27, 14d supply, fill #0
  Filled 2024-05-07: qty 27, 90d supply, fill #0
  Filled 2024-08-02: qty 27, 90d supply, fill #1

## 2024-02-19 NOTE — Telephone Encounter (Unsigned)
 Copied from CRM [id number]. Topic: Clinical - Medication Question >> Feb 19, 2024  2:46 PM Marissa P wrote: Reason for CRM: Patient called to see if she can have her meds changed to 27 tablets to 90 day supply. SUMAtriptan  (IMITREX ) 100 MG tablet is the medication. Insurance will only cover 9 tablets per month and it was written incorrectly. Please change meds to 27 tablets to 90 day supply.

## 2024-02-20 ENCOUNTER — Other Ambulatory Visit (HOSPITAL_COMMUNITY): Payer: Self-pay

## 2024-03-11 ENCOUNTER — Other Ambulatory Visit (HOSPITAL_BASED_OUTPATIENT_CLINIC_OR_DEPARTMENT_OTHER): Payer: Self-pay

## 2024-03-11 DIAGNOSIS — L910 Hypertrophic scar: Secondary | ICD-10-CM | POA: Diagnosis not present

## 2024-03-11 DIAGNOSIS — B351 Tinea unguium: Secondary | ICD-10-CM | POA: Diagnosis not present

## 2024-03-11 MED ORDER — FLUCONAZOLE 200 MG PO TABS
200.0000 mg | ORAL_TABLET | ORAL | 0 refills | Status: DC
  Filled 2024-03-11: qty 24, 84d supply, fill #0

## 2024-03-19 ENCOUNTER — Other Ambulatory Visit: Payer: Self-pay

## 2024-03-19 DIAGNOSIS — E782 Mixed hyperlipidemia: Secondary | ICD-10-CM

## 2024-03-19 DIAGNOSIS — E1159 Type 2 diabetes mellitus with other circulatory complications: Secondary | ICD-10-CM

## 2024-03-20 ENCOUNTER — Ambulatory Visit: Payer: BC Managed Care – PPO

## 2024-03-20 ENCOUNTER — Other Ambulatory Visit (HOSPITAL_COMMUNITY): Payer: Self-pay

## 2024-03-20 ENCOUNTER — Other Ambulatory Visit

## 2024-03-20 DIAGNOSIS — E782 Mixed hyperlipidemia: Secondary | ICD-10-CM

## 2024-03-20 DIAGNOSIS — I152 Hypertension secondary to endocrine disorders: Secondary | ICD-10-CM

## 2024-03-20 DIAGNOSIS — E1159 Type 2 diabetes mellitus with other circulatory complications: Secondary | ICD-10-CM | POA: Diagnosis not present

## 2024-03-21 LAB — LIPID PANEL
Chol/HDL Ratio: 2.1 ratio (ref 0.0–4.4)
Cholesterol, Total: 136 mg/dL (ref 100–199)
HDL: 66 mg/dL (ref >39)
LDL Chol Calc (NIH): 50 mg/dL (ref 0–99)
Triglycerides: 110 mg/dL (ref 0–149)
VLDL Cholesterol Cal: 20 mg/dL (ref 5–40)

## 2024-03-21 LAB — COMPREHENSIVE METABOLIC PANEL WITH GFR
ALT: 15 IU/L (ref 0–32)
AST: 21 IU/L (ref 0–40)
Albumin: 4.5 g/dL (ref 3.9–4.9)
Alkaline Phosphatase: 85 IU/L (ref 44–121)
BUN/Creatinine Ratio: 14 (ref 12–28)
BUN: 13 mg/dL (ref 8–27)
Bilirubin Total: 0.4 mg/dL (ref 0.0–1.2)
CO2: 19 mmol/L — ABNORMAL LOW (ref 20–29)
Calcium: 10.4 mg/dL — ABNORMAL HIGH (ref 8.7–10.3)
Chloride: 98 mmol/L (ref 96–106)
Creatinine, Ser: 0.95 mg/dL (ref 0.57–1.00)
Globulin, Total: 2.7 g/dL (ref 1.5–4.5)
Glucose: 98 mg/dL (ref 70–99)
Potassium: 4.3 mmol/L (ref 3.5–5.2)
Sodium: 135 mmol/L (ref 134–144)
Total Protein: 7.2 g/dL (ref 6.0–8.5)
eGFR: 66 mL/min/1.73 (ref >59)

## 2024-03-21 LAB — CBC WITH DIFFERENTIAL/PLATELET
Basophils Absolute: 0.1 x10E3/uL (ref 0.0–0.2)
Basos: 1 %
EOS (ABSOLUTE): 0.2 x10E3/uL (ref 0.0–0.4)
Eos: 3 %
Hematocrit: 43.4 % (ref 34.0–46.6)
Hemoglobin: 14 g/dL (ref 11.1–15.9)
Immature Grans (Abs): 0 x10E3/uL (ref 0.0–0.1)
Immature Granulocytes: 0 %
Lymphocytes Absolute: 1.7 x10E3/uL (ref 0.7–3.1)
Lymphs: 33 %
MCH: 26.4 pg — ABNORMAL LOW (ref 26.6–33.0)
MCHC: 32.3 g/dL (ref 31.5–35.7)
MCV: 82 fL (ref 79–97)
Monocytes Absolute: 0.5 x10E3/uL (ref 0.1–0.9)
Monocytes: 10 %
Neutrophils Absolute: 2.8 x10E3/uL (ref 1.4–7.0)
Neutrophils: 53 %
Platelets: 498 x10E3/uL — ABNORMAL HIGH (ref 150–450)
RBC: 5.31 x10E6/uL — ABNORMAL HIGH (ref 3.77–5.28)
RDW: 14.8 % (ref 11.7–15.4)
WBC: 5.3 x10E3/uL (ref 3.4–10.8)

## 2024-03-21 LAB — HEMOGLOBIN A1C
Est. average glucose Bld gHb Est-mCnc: 128 mg/dL
Hgb A1c MFr Bld: 6.1 % — ABNORMAL HIGH (ref 4.8–5.6)

## 2024-03-23 ENCOUNTER — Ambulatory Visit: Payer: Self-pay | Admitting: Family Medicine

## 2024-03-24 ENCOUNTER — Other Ambulatory Visit (HOSPITAL_COMMUNITY): Payer: Self-pay

## 2024-03-24 ENCOUNTER — Encounter: Payer: Self-pay | Admitting: Family Medicine

## 2024-03-24 ENCOUNTER — Other Ambulatory Visit: Payer: Self-pay

## 2024-03-24 ENCOUNTER — Telehealth (HOSPITAL_COMMUNITY): Payer: Self-pay

## 2024-03-24 ENCOUNTER — Ambulatory Visit (INDEPENDENT_AMBULATORY_CARE_PROVIDER_SITE_OTHER): Payer: BC Managed Care – PPO | Admitting: Family Medicine

## 2024-03-24 VITALS — BP 110/74 | HR 88 | Temp 97.9°F | Ht 64.0 in | Wt 157.0 lb

## 2024-03-24 DIAGNOSIS — R7989 Other specified abnormal findings of blood chemistry: Secondary | ICD-10-CM | POA: Diagnosis not present

## 2024-03-24 DIAGNOSIS — I152 Hypertension secondary to endocrine disorders: Secondary | ICD-10-CM

## 2024-03-24 DIAGNOSIS — F5101 Primary insomnia: Secondary | ICD-10-CM | POA: Diagnosis not present

## 2024-03-24 DIAGNOSIS — K0889 Other specified disorders of teeth and supporting structures: Secondary | ICD-10-CM

## 2024-03-24 DIAGNOSIS — F419 Anxiety disorder, unspecified: Secondary | ICD-10-CM | POA: Diagnosis not present

## 2024-03-24 DIAGNOSIS — E1159 Type 2 diabetes mellitus with other circulatory complications: Secondary | ICD-10-CM | POA: Diagnosis not present

## 2024-03-24 DIAGNOSIS — E782 Mixed hyperlipidemia: Secondary | ICD-10-CM

## 2024-03-24 DIAGNOSIS — K219 Gastro-esophageal reflux disease without esophagitis: Secondary | ICD-10-CM | POA: Diagnosis not present

## 2024-03-24 DIAGNOSIS — G43009 Migraine without aura, not intractable, without status migrainosus: Secondary | ICD-10-CM | POA: Diagnosis not present

## 2024-03-24 MED ORDER — QULIPTA 60 MG PO TABS: 60.0000 mg | ORAL_TABLET | Freq: Every day | ORAL | Status: DC

## 2024-03-24 MED ORDER — LORAZEPAM 0.5 MG PO TABS
0.5000 mg | ORAL_TABLET | Freq: Two times a day (BID) | ORAL | 1 refills | Status: AC | PRN
  Filled 2024-03-24: qty 30, 15d supply, fill #0
  Filled 2024-06-18 (×2): qty 30, 15d supply, fill #1

## 2024-03-24 MED ORDER — QULIPTA 60 MG PO TABS
60.0000 mg | ORAL_TABLET | Freq: Every day | ORAL | 3 refills | Status: DC
  Filled 2024-03-24 (×2): qty 90, 90d supply, fill #0

## 2024-03-24 NOTE — Telephone Encounter (Signed)
 Pharmacy Patient Advocate Encounter   Received notification from Pt Calls Messages that prior authorization for Qulipta  60MG  tablets  is required/requested.   Insurance verification completed.   The patient is insured through [facility] ADVANTAGE/RX ADVANCE .   Per test claim: PA required; PA submitted to above mentioned insurance via CoverMyMeds Key/confirmation #/EOC [id number] Status is pending

## 2024-03-24 NOTE — Progress Notes (Unsigned)
 Subjective:  Patient ID: Lindsey [name], female    DOB: Jan 14, 1956  Age: 68 y.o. MRN: [id number]  Chief Complaint  Patient presents with   Medical Management of Chronic Issues    HPI: Diabetes:  Complications: glomerulopathy Glucose checking: daily. Hypoglycemia: no  Most recent A1C: 6.1 Current medications: Synjardy  XR 12.01/999 mg 2 daily, ozempic  0.5mg  weekly Ozempic  0.5 mg weekly and aspirin 81 mg daily. Last Eye Exam: rescheduled in 2 weeks.                                                                                                                                                                                                 Foot checks: Daily   Hyperlipidemia: Current medications: pravachol  10 mg before bed.  Lovaza  1 g 2 capsules twice daily.  Migraines: Using Imitrex  often has migraines approx every other day.. Ubrelvy , Nurtec did not help. Previously tried ubrelvy  for acute management and prevention: nurtec, amovig, ajovy , topamax. Currently on diltiazem  ([facility]B).    Hypertension: Current medications: Lisinopril  40 mg twice daily, aspirin 81mg  daily, diltiazem  xr 120 mg daily,   Insomnia: Well-controlled with zolpidem  12.5 mg CR 1 p.o. nightly.   GERD: Omeprazole  20 mg  daily   Depression: Trintellix  5 mg daily did not help, so stopped it. Patient has decided she is going to retire. Improved regardless.    Diet: Fairly healthy. Exercising.   Discussed the use of AI scribe software for clinical note transcription with the patient, who gave verbal consent to proceed.  History of Present Illness  Lindsey [name] is a 68 year old female who presents with persistent thrombocytosis.  Thrombocytosis - Persistent elevation in platelet counts over the past six months - Platelet levels initially high, then decreased to high normal, and subsequently elevated on two additional occasions - Concern regarding the cause and implications of thrombocytosis  Headache and  migraine symptoms - Migraines occur every other day - Managed with Imitrex , which causes tiredness - Previous trial of Aimovig  without benefit - Head pain is not a daily occurrence and is often triggered by stress  Facial pain - Toothache-like pain on the left side - Pain persists despite root canal and dental evaluation  Musculoskeletal pain - Shoulder pain previously associated with computer use - Retirement has alleviated shoulder pain  Mood symptoms - No current medication for depression - Previous trial of Trintellix  without benefit  General health and nutrition - Maintains weight - Eats well - No numbness in feet        03/24/2024    2:01 PM 12/03/2023    9:45  AM 06/15/2023   11:22 AM 05/03/2023    7:27 AM 04/28/2022    7:39 AM  Depression screen PHQ 2/9  Decreased Interest 0 0 0 0 0  Down, Depressed, Hopeless 0 0 0 0 0  PHQ - 2 Score 0 0 0 0 0  Altered sleeping 1 0 0    Tired, decreased energy 0 0 1    Change in appetite 0 0 0    Feeling bad or failure about yourself  0 0 0    Trouble concentrating 0 0 0    Moving slowly or fidgety/restless 0 0 0    Suicidal thoughts 0 0 0    PHQ-9 Score 1 0 1    Difficult doing work/chores Not difficult at all Not difficult at all Not difficult at all          12/03/2023    9:44 AM  Fall Risk   Falls in the past year? 0  Number falls in past yr: 0  Injury with Fall? 0  Risk for fall due to : No Fall Risks  Follow up Falls evaluation completed    Patient Care Team: Sherre Clapper, MD as PCP - General (Family Medicine) Erasmo Bernardino BRAVO, OD (Optometry)   Review of Systems  Constitutional:  Negative for chills, fatigue and fever.  HENT:  Negative for congestion, ear pain, rhinorrhea and sore throat.   Respiratory:  Negative for cough and shortness of breath.   Cardiovascular:  Negative for chest pain.  Gastrointestinal:  Negative for abdominal pain, constipation, diarrhea, nausea and vomiting.  Genitourinary:  Negative for  dysuria and urgency.  Musculoskeletal:  Negative for back pain and myalgias.  Neurological:  Negative for dizziness, weakness, light-headedness and headaches.  Psychiatric/Behavioral:  Negative for dysphoric mood. The patient is not nervous/anxious.     Current Outpatient Medications on File Prior to Visit  Medication Sig Dispense Refill   aspirin EC 81 MG tablet Take 81 mg by mouth every morning.     Calcium  Carb-Cholecalciferol (CALCIUM  600 + D PO) Take 1 tablet by mouth at bedtime.     diltiazem  (DILACOR XR ) 240 MG 24 hr capsule Take 1 capsule (240 mg total) by mouth daily. 90 capsule 1   Empagliflozin -metFORMIN  HCl ER (SYNJARDY  XR) 12.01-999 MG TB24 Take 2 tablets by mouth daily. 180 tablet 3   glucose blood (CONTOUR NEXT TEST) test strip Use as instructed 100 each 3   Lancet Devices (MICROLET NEXT LANCING DEVICE) MISC 1 each by Does not apply route in the morning. 1 each 0   lisinopril  (ZESTRIL ) 40 MG tablet TAKE 1 TABLET BY MOUTH TWICE  DAILY 180 tablet 3   Magnesium 250 MG TABS Take by mouth in the morning and at bedtime.     omega-3 acid ethyl esters (LOVAZA ) 1 g capsule Take 2 capsules (2 g total) by mouth 2 (two) times daily. 360 capsule 3   omeprazole  (PRILOSEC) 20 MG capsule Take 1 capsule (20 mg total) by mouth daily before a meal. 90 capsule 3   ondansetron  (ZOFRAN ) 4 MG tablet Take 1 tablet (4 mg total) by mouth every 8 (eight) hours as needed for nausea or vomiting. 40 tablet 0   pravastatin  (PRAVACHOL ) 10 MG tablet Take 1 tablet (10 mg total) by mouth daily. 90 tablet 3   Probiotic Product (PROBIOTIC BLEND PO) Take by mouth daily.     Semaglutide ,0.25 or 0.5MG /DOS, (OZEMPIC , 0.25 OR 0.5 MG/DOSE,) 2 MG/3ML SOPN INJECT 0.5 MG INTO THE SKIN  ONE TIME PER WEEK 6 mL 3   SUMAtriptan  (IMITREX ) 100 MG tablet TAKE 1 TABLET BY MOUTH AT ONSET  OF MIGRAINE MAY REPEAT IN 2  HOURS AS NEEDED. MAX OF 2  TABLETS DAILY 27 tablet 1   zolpidem  (AMBIEN  CR) 12.5 MG CR tablet Take 1 tablet (12.5 mg  total) by mouth at bedtime. 90 tablet 1   No current facility-administered medications on file prior to visit.   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 Abigail Free, LOV 12/19/21 in Epic as of 03/15/22.   Dyslipidemia associated with type 2 diabetes mellitus [facility])    Patient follows with Dr. Abigail Free, LOV 12/19/21 in Epic as of 03/15/22.   Frequent UTI     h/o since hysterectomy/04/01/13 asymptomatic   GERD (gastroesophageal reflux disease)    Takes Omeprazole  daily as of 03/15/22.   Headache(784.0)    migraines   Hypertension    Currently on Lisinopril . Follows w/ PCP Dr. Abigail Free, ARNETTA 12/19/21 as of 03/15/22.   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 Overview:  The patient in 09/2009 underwent TAHBSO with the findings of a ruptured 10 cm right ovarian mass. 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   Past Surgical History:  Procedure Laterality Date   ABDOMINAL HYSTERECTOMY  2011   Stromal ovarian cancer   ANTERIOR AND POSTERIOR REPAIR WITH SACROSPINOUS FIXATION N/A 03/27/2022   Procedure: ANTERIOR REPAIR WITH SACROSPINOUS FIXATION;  Surgeon: Marilynne Rosaline SAILOR, MD;  Location[facility];  Service: Gynecology;  Laterality: N/A;   CHOLECYSTECTOMY  1989   COLONOSCOPY  2010   no polyps per pt   CYSTOSCOPY N/A 03/27/2022   Procedure: CYSTOSCOPY;  Surgeon: Marilynne Rosaline SAILOR, MD;  Location: [facility];  Service: Gynecology;  Laterality: N/A;   ENTEROCELE REPAIR     around 2007   KNEE ARTHROSCOPY Right    many yrs ago before knee replacemnt   KNEE ARTHROSCOPY Right 07/01/2014    Procedure: RIGHT ARTHROSCOPY KNEE WITH SYNOVECTOMY;  Surgeon: Dempsey Melodi GAILS, MD;  Location: [facility];  Service: Orthopedics;  Laterality: Right;   RECTOCELE REPAIR     around 2007   TONSILLECTOMY  1975   TOTAL KNEE ARTHROPLASTY Right 04/07/2013   Procedure: RIGHT TOTAL KNEE ARTHROPLASTY;  Surgeon: Dempsey GAILS Melodi, MD;  Location: [facility];  Service: Orthopedics;  Laterality: Right;    Family History  Problem Relation Age of Onset   Hyperlipidemia Mother    Hypertension Mother    Transient ischemic attack Mother    Hyperlipidemia Father    Hypertension Father    Lung cancer Father    BRCA 1/2 Maternal Grandmother    Social History   Socioeconomic History   Marital status: Married    Spouse name: Not on file   Number of children: Not on file   Years of education: Not on file   Highest education level: Some college, no degree  Occupational History   Occupation: Retired  Tobacco Use   Smoking status: Never   Smokeless tobacco: Never  Vaping Use   Vaping status:  Never Used  Substance and Sexual Activity   Alcohol use: No   Drug use: No   Sexual activity: Yes    Birth control/protection: Surgical    Comment: hysterectomy  Other Topics Concern   Not on file  Social History Narrative   Not on file   Social Drivers of Health   Financial Resource Strain: Low Risk  (09/24/2023)   Overall Financial Resource Strain (CARDIA)    Difficulty of Paying Living Expenses: Not hard at all  Food Insecurity: No Food Insecurity (09/24/2023)   Hunger Vital Sign    Worried About Running Out of Food in the Last Year: Never true    Ran Out of Food in the Last Year: Never true  Transportation Needs: No Transportation Needs (09/24/2023)   PRAPARE - Administrator, Civil Service (Medical): No    Lack of Transportation (Non-Medical): No  Physical Activity: Insufficiently Active (09/24/2023)   Exercise Vital Sign    Days of Exercise per Week: 3 days    Minutes of Exercise per Session: 20  min  Stress: No Stress Concern Present (09/24/2023)   [employer] of Occupational Health - Occupational Stress Questionnaire    Feeling of Stress : Only a little  Social Connections: Socially Integrated (09/24/2023)   Social Connection and Isolation Panel    Frequency of Communication with Friends and Family: More than three times a week    Frequency of Social Gatherings with Friends and Family: Once a week    Attends Religious Services: More than 4 times per year    Active Member of [employer] or Organizations: Yes    Attends Engineer, structural: More than 4 times per year    Marital Status: Married    Objective:  BP 110/74   Pulse 88   Temp 97.9 F (36.6 C)   Ht 5' 4 (1.626 m)   Wt 157 lb (71.2 kg)   SpO2 98%   BMI 26.95 kg/m      03/24/2024    1:55 PM 12/21/2023    8:03 AM 12/03/2023    9:42 AM  BP/Weight  Systolic BP 110 102 138  Diastolic BP 74 68 82  Wt. (Lbs) 157  159.6  BMI 26.95 kg/m2  27.4 kg/m2    Physical Exam Vitals reviewed.  Constitutional:      Appearance: Normal appearance. She is normal weight.  Neck:     Vascular: No carotid bruit.  Cardiovascular:     Rate and Rhythm: Normal rate and regular rhythm.     Heart sounds: Normal heart sounds.  Pulmonary:     Effort: Pulmonary effort is normal. No respiratory distress.     Breath sounds: Normal breath sounds.  Abdominal:     General: Abdomen is flat. Bowel sounds are normal.     Palpations: Abdomen is soft.     Tenderness: There is no abdominal tenderness.  Neurological:     Mental Status: She is alert and oriented to person, place, and time.  Psychiatric:        Mood and Affect: Mood normal.        Behavior: Behavior normal.     {Perform Simple Foot Exam  Perform Detailed exam:1} Diabetic foot exam was performed with the following findings:   No deformities, ulcerations, or other skin breakdown Normal sensation of 10g monofilament Intact posterior tibialis and dorsalis pedis  pulses      Lab Results  Component Value Date   WBC 5.3 03/20/2024  HGB 14.0 03/20/2024   HCT 43.4 03/20/2024   PLT 498 (H) 03/20/2024   GLUCOSE 98 03/20/2024   CHOL 136 03/20/2024   TRIG 110 03/20/2024   HDL 66 03/20/2024   LDLCALC 50 03/20/2024   ALT 15 03/20/2024   AST 21 03/20/2024   NA 135 03/20/2024   K 4.3 03/20/2024   CL 98 03/20/2024   CREATININE 0.95 03/20/2024   BUN 13 03/20/2024   CO2 19 (L) 03/20/2024   INR 0.92 04/01/2013   HGBA1C 6.1 (H) 03/20/2024   MICROALBUR 10 06/29/2021      Assessment & Plan:  Hypertension associated with diabetes (HCC) Assessment & Plan: Stable on current regimen of Lisinopril  and Diltiazem .  -Monitor blood pressure at home.  Control: Well controlled Recommend check sugars fasting daily. Recommend check feet daily. Recommend annual eye exams. Medicines: Synjardy  XR 12.01/999 mg 2 daily, Ozempic  0.5 mg weekly and aspirin 81 mg daily. Continue to work on eating a healthy diet and exercise.      Anxiety Assessment & Plan: The current medical regimen is effective;  continue present plan and medications.   Orders: -     LORazepam ; Take 1 tablet (0.5 mg total) by mouth 2 (two) times daily as needed for anxiety.  Dispense: 30 tablet; Refill: 1  Mixed hyperlipidemia Assessment & Plan: Well controlled.  No changes to medicines. Continue pravastatin  10 mg daily and lovaza  1 gm 2 capsules twice daily.  Continue to work on eating a healthy diet and exercise.     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 Referral to hematologist planned for further evaluation. - Refer to hematologist Dr. Ezzard at the cancer center for further evaluation of thrombocytosis.  Orders: -     Ambulatory referral to Hematology / Oncology  Migraine without aura and without status migrainosus, not intractable Assessment & Plan: Migraines occur every other day.  Previous treatments ineffective. Imitrex  provides relief but causes fatigue.   Tooth pain Assessment & Plan: Tooth pain persists on side of previous root canal. Extensive dental evaluation negative. Possible neuropathic origin.   Other orders -     Qulipta ; Take 1 tablet (60 mg total) by mouth daily. -     Qulipta ; Take 1 tablet (60 mg total) by mouth daily.  Dispense: 90 tablet; Refill: 3     Meds ordered this encounter  Medications   LORazepam  (ATIVAN ) 0.5 MG tablet    Sig: Take 1 tablet (0.5 mg total) by mouth 2 (two) times daily as needed for anxiety.    Dispense:  30 tablet    Refill:  1   Atogepant  (QULIPTA ) 60 MG TABS    Sig: Take 1 tablet (60 mg total) by mouth daily.   Atogepant  (QULIPTA ) 60 MG TABS    Sig: Take 1 tablet (60 mg total) by mouth daily.    Dispense:  90 tablet    Refill:  3    Orders Placed This Encounter  Procedures   HM DEXA SCAN   Ambulatory referral to Hematology / Oncology     Follow-up: Return in about 3 months (around 06/24/2024) for chronic follow up.   I,Katherina A Bramblett,acting as a scribe for Abigail Free, MD.,have documented all relevant documentation on the behalf of Abigail Free, MD,as directed by  Abigail Free, MD while in the presence of Abigail Free, MD.   LILLETTE Kato I Leal-Borjas,acting as a scribe for Abigail Free, MD.,have documented all relevant documentation on the behalf  of Abigail Free, MD,as directed by  Abigail Free, MD while in the presence of Abigail Free, MD.    An After Visit Summary was printed and given to the patient.  I attest that I have reviewed this visit and agree with the plan scribed by my staff.   Abigail Free, MD [facility] [phone]

## 2024-03-24 NOTE — Telephone Encounter (Signed)
 PA request has been Received. New Encounter has been or will be created for follow up. For additional info see Pharmacy Prior Auth telephone encounter from 03/24/24.

## 2024-03-24 NOTE — Telephone Encounter (Signed)
 Pharmacy Patient Advocate Encounter  Received notification from [facility] [employer] that Prior Authorization for Qulipta  60MG  tablets  has been APPROVED from 03/24/24 to 09/20/24. Ran test claim, Copay is $0. This test claim was processed through [facility]- copay amounts may vary at other pharmacies due to pharmacy/plan contracts, or as the patient moves through the different stages of their insurance plan.   PA #/Case ID/Reference #: [id number]

## 2024-03-24 NOTE — Patient Instructions (Addendum)
 Start on qlipta 60 mg daily.   Refer to hematology for elevated platelets.   Due for eye exam. - Attend rescheduled eye appointment with Dr. Oneil Dixons on August 11th at 9:00 AM.

## 2024-03-25 ENCOUNTER — Other Ambulatory Visit (HOSPITAL_COMMUNITY): Payer: Self-pay

## 2024-03-26 DIAGNOSIS — K0889 Other specified disorders of teeth and supporting structures: Secondary | ICD-10-CM | POA: Insufficient documentation

## 2024-03-26 NOTE — Assessment & Plan Note (Signed)
 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 Referral to hematologist planned for further evaluation. - Refer to hematologist Dr. Ezzard at the cancer center for further evaluation of thrombocytosis.

## 2024-03-26 NOTE — Assessment & Plan Note (Addendum)
 Stable on current regimen of Lisinopril  and Diltiazem .  -Monitor blood pressure at home.  Control: Well controlled Recommend check sugars fasting daily. Recommend check feet daily. Recommend annual eye exams. Medicines: Synjardy  XR 12.01/999 mg 2 daily, Ozempic  0.5 mg weekly and aspirin 81 mg daily. Continue to work on eating a healthy diet and exercise.

## 2024-03-26 NOTE — Assessment & Plan Note (Signed)
 Well controlled.  No changes to medicines. Continue pravastatin  10 mg daily and lovaza  1 gm 2 capsules twice daily.  Continue to work on eating a healthy diet and exercise.

## 2024-03-26 NOTE — Assessment & Plan Note (Signed)
 Tooth pain persists on side of previous root canal. Extensive dental evaluation negative. Possible neuropathic origin.

## 2024-03-26 NOTE — Assessment & Plan Note (Signed)
 The current medical regimen is effective;  continue present plan and medications.

## 2024-03-26 NOTE — Assessment & Plan Note (Signed)
 Migraines occur every other day. Previous treatments ineffective. Imitrex  provides relief but causes fatigue. Continue cardizem .  Start on qlipta 60 mg daily.

## 2024-03-27 NOTE — Assessment & Plan Note (Signed)
The current medical regimen is effective;  continue present plan and medications. Continue omeprazole 20 mg once daily.  

## 2024-03-27 NOTE — Assessment & Plan Note (Signed)
Well-controlled on Ambien CR 12.5 mg nightly ?

## 2024-04-14 ENCOUNTER — Other Ambulatory Visit: Payer: Self-pay | Admitting: Hematology and Oncology

## 2024-04-14 ENCOUNTER — Inpatient Hospital Stay

## 2024-04-14 ENCOUNTER — Encounter: Payer: Self-pay | Admitting: Hematology and Oncology

## 2024-04-14 ENCOUNTER — Inpatient Hospital Stay: Attending: Hematology and Oncology | Admitting: Hematology and Oncology

## 2024-04-14 ENCOUNTER — Other Ambulatory Visit: Payer: Self-pay

## 2024-04-14 ENCOUNTER — Telehealth: Payer: Self-pay | Admitting: Hematology and Oncology

## 2024-04-14 ENCOUNTER — Encounter: Payer: Self-pay | Admitting: Family Medicine

## 2024-04-14 VITALS — BP 143/92 | HR 61 | Temp 98.4°F | Resp 14 | Ht 64.0 in | Wt 156.2 lb

## 2024-04-14 DIAGNOSIS — D509 Iron deficiency anemia, unspecified: Secondary | ICD-10-CM | POA: Diagnosis not present

## 2024-04-14 DIAGNOSIS — D75839 Thrombocytosis, unspecified: Secondary | ICD-10-CM

## 2024-04-14 LAB — CBC WITH DIFFERENTIAL (CANCER CENTER ONLY)
Abs Immature Granulocytes: 0.02 K/uL (ref 0.00–0.07)
Basophils Absolute: 0.1 K/uL (ref 0.0–0.1)
Basophils Relative: 1 %
Eosinophils Absolute: 0.1 K/uL (ref 0.0–0.5)
Eosinophils Relative: 1 %
HCT: 40.3 % (ref 36.0–46.0)
Hemoglobin: 12.6 g/dL (ref 12.0–15.0)
Immature Granulocytes: 0 %
Lymphocytes Relative: 18 %
Lymphs Abs: 1.6 K/uL (ref 0.7–4.0)
MCH: 25.4 pg — ABNORMAL LOW (ref 26.0–34.0)
MCHC: 31.3 g/dL (ref 30.0–36.0)
MCV: 81.1 fL (ref 80.0–100.0)
Monocytes Absolute: 0.6 K/uL (ref 0.1–1.0)
Monocytes Relative: 7 %
Neutro Abs: 6.7 K/uL (ref 1.7–7.7)
Neutrophils Relative %: 73 %
Platelet Count: 439 K/uL — ABNORMAL HIGH (ref 150–400)
RBC: 4.97 MIL/uL (ref 3.87–5.11)
RDW: 15.8 % — ABNORMAL HIGH (ref 11.5–15.5)
WBC Count: 9.1 K/uL (ref 4.0–10.5)
nRBC: 0 % (ref 0.0–0.2)

## 2024-04-14 LAB — CMP (CANCER CENTER ONLY)
ALT: 12 U/L (ref 0–44)
AST: 19 U/L (ref 15–41)
Albumin: 4.4 g/dL (ref 3.5–5.0)
Alkaline Phosphatase: 81 U/L (ref 38–126)
Anion gap: 14 (ref 5–15)
BUN: 14 mg/dL (ref 8–23)
CO2: 22 mmol/L (ref 22–32)
Calcium: 9.8 mg/dL (ref 8.9–10.3)
Chloride: 100 mmol/L (ref 98–111)
Creatinine: 0.82 mg/dL (ref 0.44–1.00)
GFR, Estimated: >60 mL/min (ref >60)
Glucose, Bld: 115 mg/dL — ABNORMAL HIGH (ref 70–99)
Potassium: 4 mmol/L (ref 3.5–5.1)
Sodium: 136 mmol/L (ref 135–145)
Total Bilirubin: 0.4 mg/dL (ref 0.0–1.2)
Total Protein: 7.2 g/dL (ref 6.5–8.1)

## 2024-04-14 LAB — IRON AND TIBC
Iron: 36 ug/dL (ref 28–170)
Saturation Ratios: 7 % — ABNORMAL LOW (ref 10.4–31.8)
TIBC: 531 ug/dL — ABNORMAL HIGH (ref 250–450)
UIBC: 495 ug/dL

## 2024-04-14 LAB — HM DIABETES EYE EXAM

## 2024-04-14 LAB — FERRITIN: Ferritin: 5 ng/mL — ABNORMAL LOW (ref 11–307)

## 2024-04-14 NOTE — Telephone Encounter (Signed)
 Patient has been scheduled for follow-up visit per 04/14/24 LOS.  Pt noted appt details on personal electronic device.

## 2024-04-15 ENCOUNTER — Other Ambulatory Visit: Payer: Self-pay | Admitting: Hematology and Oncology

## 2024-04-15 ENCOUNTER — Telehealth: Payer: Self-pay | Admitting: Hematology and Oncology

## 2024-04-15 DIAGNOSIS — D509 Iron deficiency anemia, unspecified: Secondary | ICD-10-CM | POA: Insufficient documentation

## 2024-04-15 NOTE — Telephone Encounter (Signed)
Patient has been scheduled for IV Iron. Aware of appt dates and times.   

## 2024-04-18 ENCOUNTER — Inpatient Hospital Stay

## 2024-04-18 VITALS — BP 130/74 | HR 58 | Temp 98.2°F | Resp 18

## 2024-04-18 DIAGNOSIS — D509 Iron deficiency anemia, unspecified: Secondary | ICD-10-CM

## 2024-04-18 MED ORDER — SODIUM CHLORIDE 0.9 % IV SOLN: INTRAVENOUS | Status: DC

## 2024-04-18 MED ORDER — IRON SUCROSE 20 MG/ML IV SOLN
200.0000 mg | Freq: Once | INTRAVENOUS | Status: AC
  Administered 2024-04-18: 200 mg via INTRAVENOUS
  Filled 2024-04-18: qty 10

## 2024-04-18 MED ORDER — LORATADINE 10 MG PO TABS
10.0000 mg | ORAL_TABLET | Freq: Once | ORAL | Status: AC
  Administered 2024-04-18: 10 mg via ORAL
  Filled 2024-04-18: qty 1

## 2024-04-18 MED ORDER — ACETAMINOPHEN 325 MG PO TABS
650.0000 mg | ORAL_TABLET | Freq: Once | ORAL | Status: AC
  Administered 2024-04-18: 650 mg via ORAL
  Filled 2024-04-18: qty 2

## 2024-04-18 NOTE — Patient Instructions (Signed)

## 2024-04-21 ENCOUNTER — Inpatient Hospital Stay

## 2024-04-21 VITALS — BP 137/72 | HR 64 | Temp 97.7°F | Resp 18

## 2024-04-21 DIAGNOSIS — D509 Iron deficiency anemia, unspecified: Secondary | ICD-10-CM

## 2024-04-21 MED ORDER — IRON SUCROSE 20 MG/ML IV SOLN
200.0000 mg | Freq: Once | INTRAVENOUS | Status: AC
  Administered 2024-04-21: 200 mg via INTRAVENOUS
  Filled 2024-04-21: qty 10

## 2024-04-21 MED ORDER — LORATADINE 10 MG PO TABS
10.0000 mg | ORAL_TABLET | Freq: Once | ORAL | Status: AC
  Administered 2024-04-21: 10 mg via ORAL
  Filled 2024-04-21: qty 1

## 2024-04-21 MED ORDER — ACETAMINOPHEN 325 MG PO TABS
650.0000 mg | ORAL_TABLET | Freq: Once | ORAL | Status: AC
  Administered 2024-04-21: 650 mg via ORAL
  Filled 2024-04-21: qty 2

## 2024-04-21 NOTE — Patient Instructions (Signed)

## 2024-04-22 ENCOUNTER — Inpatient Hospital Stay

## 2024-04-22 ENCOUNTER — Other Ambulatory Visit: Payer: Self-pay | Admitting: Pharmacist

## 2024-04-22 VITALS — BP 119/64 | HR 58 | Temp 98.1°F | Resp 20

## 2024-04-22 DIAGNOSIS — D509 Iron deficiency anemia, unspecified: Secondary | ICD-10-CM

## 2024-04-22 MED ORDER — IRON SUCROSE 20 MG/ML IV SOLN
200.0000 mg | Freq: Once | INTRAVENOUS | Status: AC
  Administered 2024-04-22: 200 mg via INTRAVENOUS
  Filled 2024-04-22: qty 10

## 2024-04-22 MED ORDER — LORATADINE 10 MG PO TABS: 10.0000 mg | ORAL_TABLET | Freq: Once | ORAL | Status: DC

## 2024-04-22 MED ORDER — ACETAMINOPHEN 325 MG PO TABS: 650.0000 mg | ORAL_TABLET | Freq: Once | ORAL | Status: DC

## 2024-04-22 NOTE — Patient Instructions (Signed)

## 2024-04-23 ENCOUNTER — Encounter: Payer: Self-pay | Admitting: Hematology and Oncology

## 2024-04-23 ENCOUNTER — Inpatient Hospital Stay

## 2024-04-23 VITALS — BP 126/80 | HR 70 | Temp 97.7°F | Resp 18 | Ht 64.0 in | Wt 155.8 lb

## 2024-04-23 DIAGNOSIS — D509 Iron deficiency anemia, unspecified: Secondary | ICD-10-CM | POA: Diagnosis not present

## 2024-04-23 MED ORDER — IRON SUCROSE 20 MG/ML IV SOLN
200.0000 mg | Freq: Once | INTRAVENOUS | Status: AC
  Administered 2024-04-23: 200 mg via INTRAVENOUS
  Filled 2024-04-23: qty 10

## 2024-04-23 MED ORDER — ACETAMINOPHEN 325 MG PO TABS: 650.0000 mg | ORAL_TABLET | Freq: Once | ORAL | Status: DC

## 2024-04-23 MED ORDER — LORATADINE 10 MG PO TABS: 10.0000 mg | ORAL_TABLET | Freq: Once | ORAL | Status: DC

## 2024-04-23 NOTE — Patient Instructions (Signed)

## 2024-04-24 ENCOUNTER — Inpatient Hospital Stay

## 2024-04-24 VITALS — BP 123/83 | HR 66 | Temp 97.7°F

## 2024-04-24 DIAGNOSIS — D509 Iron deficiency anemia, unspecified: Secondary | ICD-10-CM

## 2024-04-24 MED ORDER — ACETAMINOPHEN 325 MG PO TABS: 650.0000 mg | ORAL_TABLET | Freq: Once | ORAL | Status: DC

## 2024-04-24 MED ORDER — IRON SUCROSE 20 MG/ML IV SOLN
200.0000 mg | Freq: Once | INTRAVENOUS | Status: AC
  Administered 2024-04-24: 200 mg via INTRAVENOUS
  Filled 2024-04-24: qty 10

## 2024-04-24 MED ORDER — LORATADINE 10 MG PO TABS: 10.0000 mg | ORAL_TABLET | Freq: Once | ORAL | Status: DC

## 2024-04-24 NOTE — Progress Notes (Signed)
 [facility] [address] [place],  [place]  [address] [phone]  Clinic Day:  04/24/2024   Referring physician: Sherre Clapper, MD  Patient Care Team: Patient Care Team: Sherre Clapper, MD as PCP - General (Family Medicine) Erasmo Bernardino BRAVO, OD (Optometry)   REASON FOR CONSULTATION:  Elevated Platelets  HISTORY OF PRESENT ILLNESS:   Lindsey [name] is a 67 y.o. female with a history of elevated platelets who is referred in consultation by Josette Sherre, MD for assessment and management. She was noted to have elevated platelets in March at 473. She reports symptoms including fatigue, shortness of breath upon exertion and pica of crushed ice. She denies fever, chills, nausea or vomiting. She denies chest pain or cough. She denies issue with bowel or bladder. She denies changes in appetite or weight loss. She is a never smoker and denies use of alcohol or drugs.    REVIEW OF SYSTEMS:   Review of Systems  Constitutional:  Positive for fatigue.  HENT:  Negative.    Eyes: Negative.   Respiratory:  Positive for shortness of breath.   Cardiovascular: Negative.   Gastrointestinal: Negative.   Endocrine: Negative.   Genitourinary: Negative.    Skin: Negative.   Neurological:  Positive for extremity weakness.  Hematological: Negative.   Psychiatric/Behavioral: Negative.       VITALS:   Blood pressure (!) 143/92, pulse 61, temperature 98.4 F (36.9 C), temperature source Oral, resp. rate 14, height 5' 4 (1.626 m), weight 156 lb 3.2 oz (70.9 kg), SpO2 96%.  Wt Readings from Last 3 Encounters:  04/23/24 155 lb 12 oz (70.6 kg)  04/14/24 156 lb 3.2 oz (70.9 kg)  03/24/24 157 lb (71.2 kg)    Body mass index is 26.81 kg/m.  Performance status (ECOG): 1 - Symptomatic but completely ambulatory  PHYSICAL EXAM:   Physical Exam Constitutional:      Appearance: Normal appearance. She is normal weight.  HENT:     Mouth/Throat:     Mouth: Mucous membranes are  moist.  Cardiovascular:     Rate and Rhythm: Normal rate and regular rhythm.     Pulses: Normal pulses.     Heart sounds: Normal heart sounds.  Pulmonary:     Effort: Pulmonary effort is normal.     Breath sounds: Normal breath sounds.  Abdominal:     Palpations: Abdomen is soft.  Musculoskeletal:        General: Normal range of motion.     Cervical back: Normal range of motion.  Skin:    General: Skin is warm and dry.  Neurological:     General: No focal deficit present.     Mental Status: She is alert and oriented to person, place, and time. Mental status is at baseline.  Psychiatric:        Mood and Affect: Mood normal.        Behavior: Behavior normal.        Thought Content: Thought content normal.        Judgment: Judgment normal.      LABS:      Latest Ref Rng & Units 04/14/2024   12:25 PM 03/20/2024    7:54 AM 12/03/2023   10:17 AM  CBC  WBC 4.0 - 10.5 K/uL 9.1  5.3  6.6   Hemoglobin 12.0 - 15.0 g/dL 87.3  85.9  86.3   Hematocrit 36.0 - 46.0 % 40.3  43.4  42.6   Platelets 150 - 400  K/uL 439  498  474       Latest Ref Rng & Units 04/14/2024   12:25 PM 03/20/2024    7:54 AM 12/03/2023   10:17 AM  CMP  Glucose 70 - 99 mg/dL 884  98  94   BUN 8 - 23 mg/dL 14  13  18    Creatinine 0.44 - 1.00 mg/dL 9.17  9.04  9.17   Sodium 135 - 145 mmol/L 136  135  136   Potassium 3.5 - 5.1 mmol/L 4.0  4.3  4.7   Chloride 98 - 111 mmol/L 100  98  100   CO2 22 - 32 mmol/L 22  19  21    Calcium  8.9 - 10.3 mg/dL 9.8  89.5  89.8   Total Protein 6.5 - 8.1 g/dL 7.2  7.2  7.0   Total Bilirubin 0.0 - 1.2 mg/dL 0.4  0.4  0.3   Alkaline Phos 38 - 126 U/L 81  85  81   AST 15 - 41 U/L 19  21  18    ALT 0 - 44 U/L 12  15  12       No results found for: CEA1, CEA / No results found for: CEA1, CEA No results found for: PSA1 No results found for: CAN199 No results found for: CAN125  No results found for: TOTALPROTELP, ALBUMINELP, A1GS, A2GS, BETS, BETA2SER,  GAMS, MSPIKE, SPEI Lab Results  Component Value Date   TIBC 531 (H) 04/14/2024   FERRITIN 5 (L) 04/14/2024   IRONPCTSAT 7 (L) 04/14/2024   No results found for: LDH  STUDIES:   No results found.    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 Abigail Free, LOV 12/19/21 in Epic as of 03/15/22.   Dyslipidemia associated with type 2 diabetes mellitus [facility])    Patient follows with Dr. Abigail Free, LOV 12/19/21 in Epic as of 03/15/22.   Frequent UTI     h/o since hysterectomy/04/01/13 asymptomatic   GERD (gastroesophageal reflux disease)    Takes Omeprazole  daily as of 03/15/22.   Headache(784.0)    migraines   Hypertension    Currently on Lisinopril . Follows w/ PCP Dr. Abigail Free, ARNETTA 12/19/21 as of 03/15/22.   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 Overview:  The patient in 09/2009 underwent TAHBSO with the findings of a ruptured 10 cm right ovarian mass. 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    Past Surgical History:  Procedure Laterality Date   ABDOMINAL HYSTERECTOMY  2011   Stromal ovarian cancer   ANTERIOR AND POSTERIOR REPAIR WITH SACROSPINOUS FIXATION N/A 03/27/2022   Procedure: ANTERIOR REPAIR WITH SACROSPINOUS FIXATION;  Surgeon: Marilynne Rosaline SAILOR, MD;  Location: [facility];  Service: Gynecology;  Laterality: N/A;   CHOLECYSTECTOMY  1989   COLONOSCOPY  2010   no polyps per pt   CYSTOSCOPY N/A 03/27/2022   Procedure: CYSTOSCOPY;  Surgeon: Marilynne Rosaline SAILOR, MD;  Location: [facility];  Service: Gynecology;  Laterality: N/A;   ENTEROCELE REPAIR     around  2007   KNEE ARTHROSCOPY  Right    many yrs ago before knee replacemnt   KNEE ARTHROSCOPY Right 07/01/2014   Procedure: RIGHT ARTHROSCOPY KNEE WITH SYNOVECTOMY;  Surgeon: Dempsey Melodi GAILS, MD;  Location: [facility];  Service: Orthopedics;  Laterality: Right;   RECTOCELE REPAIR     around 2007   TONSILLECTOMY  1975   TOTAL KNEE ARTHROPLASTY Right 04/07/2013   Procedure: RIGHT TOTAL KNEE ARTHROPLASTY;  Surgeon: Dempsey GAILS Melodi, MD;  Location: [facility];  Service: Orthopedics;  Laterality: Right;    Family History  Problem Relation Age of Onset   Hyperlipidemia Mother    Hypertension Mother    Transient ischemic attack Mother    Hyperlipidemia Father    Hypertension Father    Lung cancer Father    BRCA 1/2 Maternal Grandmother     Social History:  reports that she has never smoked. She has never used smokeless tobacco. She reports that she does not drink alcohol and does not use drugs.The patient is accompanied by husband today.  Allergies:  Allergies  Allergen Reactions   Amoxicillin Hives   Trulicity [Dulaglutide] Nausea And Vomiting   Citalopram Other (See Comments)    headache   Erythromycin Hives and Rash   Hydrocodone Other (See Comments)    Achy all over    Sertraline Other (See Comments)    irritability   Sulfa Antibiotics Rash    fever   Tramadol  Nausea Only and Palpitations   Trazodone And Nefazodone Other (See Comments)    Insomnia    Venlafaxine Other (See Comments)    migrane     Current Medications: Current Outpatient Medications  Medication Sig Dispense Refill   aspirin EC 81 MG tablet Take 81 mg by mouth every morning.     Calcium  Carb-Cholecalciferol (CALCIUM  600 + D PO) Take 1 tablet by mouth at bedtime.     diltiazem  (DILACOR XR ) 240 MG 24 hr capsule Take 1 capsule (240 mg total) by mouth daily. 90 capsule 1   Empagliflozin -metFORMIN  HCl ER (SYNJARDY  XR) 12.01-999 MG TB24 Take 2 tablets by mouth daily. 180 tablet 3   glucose blood (CONTOUR NEXT TEST) test  strip Use as instructed 100 each 3   Lancet Devices (MICROLET NEXT LANCING DEVICE) MISC 1 each by Does not apply route in the morning. 1 each 0   lisinopril  (ZESTRIL ) 40 MG tablet TAKE 1 TABLET BY MOUTH TWICE  DAILY 180 tablet 3   LORazepam  (ATIVAN ) 0.5 MG tablet Take 1 tablet (0.5 mg total) by mouth 2 (two) times daily as needed for anxiety. 30 tablet 1   Magnesium 250 MG TABS Take by mouth in the morning and at bedtime.     omega-3 acid ethyl esters (LOVAZA ) 1 g capsule Take 2 capsules (2 g total) by mouth 2 (two) times daily. 360 capsule 3   omeprazole  (PRILOSEC) 20 MG capsule Take 1 capsule (20 mg total) by mouth daily before a meal. 90 capsule 3   ondansetron  (ZOFRAN ) 4 MG tablet Take 1 tablet (4 mg total) by mouth every 8 (eight) hours as needed for nausea or vomiting. 40 tablet 0   pravastatin  (PRAVACHOL ) 10 MG tablet Take 1 tablet (10 mg total) by mouth daily. 90 tablet 3   Probiotic Product (PROBIOTIC BLEND PO) Take by mouth daily.     Semaglutide ,0.25 or 0.5MG /DOS, (OZEMPIC , 0.25 OR 0.5 MG/DOSE,) 2 MG/3ML SOPN INJECT 0.5 MG INTO THE SKIN ONE TIME PER WEEK 6 mL 3   SUMAtriptan  (IMITREX ) 100 MG tablet TAKE 1 TABLET  BY MOUTH AT ONSET  OF MIGRAINE MAY REPEAT IN 2  HOURS AS NEEDED. MAX OF 2  TABLETS DAILY 27 tablet 1   zolpidem  (AMBIEN  CR) 12.5 MG CR tablet Take 1 tablet (12.5 mg total) by mouth at bedtime. 90 tablet 1   JUBLIA 10 % SOLN Apply topically.     No current facility-administered medications for this visit.     ASSESSMENT & PLAN:   Assessment:  AKEIRA LAHM is a 68 y.o. female is here for elevated platelets. These are mildly elevated today at 439. She reports symptoms of fatigue, craving ice and shortness of breath upon exertion. We have ordered iron  studies today, which are pending. I have discussed that iron  deficiency could account for her symptoms as well as elevated platelets. We discussed potential treatment options should iron  be decreased including IV iron  infusions.  She is agreeable to this plan.   Plan: 1.  Pending iron  studies will schedule for IV iron .   I discussed the assessment and treatment plan with the patient.  The patient was provided an opportunity to ask questions and all were answered.  The patient agreed with the plan and demonstrated an understanding of the instructions.    Thank you for the referral    45 minutes was spent in patient care.  This included time spent preparing to see the patient (e.g., review of tests), obtaining and/or reviewing separately obtained history, counseling and educating the patient/family/caregiver, ordering medications, tests, or procedures; documenting clinical information in the electronic or other health record, independently interpreting results and communicating results to the patient/family/caregiver as well as coordination of care.      Lindsey DELENA Bach, NP   Family Nurse Practitioner - Board Certified [facility] [place] [phone]

## 2024-04-24 NOTE — Patient Instructions (Signed)

## 2024-04-28 ENCOUNTER — Encounter: Payer: Self-pay | Admitting: Hematology and Oncology

## 2024-04-28 ENCOUNTER — Other Ambulatory Visit

## 2024-04-28 ENCOUNTER — Ambulatory Visit: Admitting: Hematology and Oncology

## 2024-04-30 LAB — JAK2 V617F RFX CALR/MPL/E12-15

## 2024-04-30 LAB — CALR +MPL + E12-E15  (REFLEX)

## 2024-05-07 ENCOUNTER — Other Ambulatory Visit (HOSPITAL_COMMUNITY): Payer: Self-pay

## 2024-05-07 ENCOUNTER — Other Ambulatory Visit: Payer: Self-pay

## 2024-05-07 MED FILL — Omega-3-acid Ethyl Esters Cap 1 GM: ORAL | 90 days supply | Qty: 360 | Fill #1 | Status: AC

## 2024-05-07 MED FILL — Empagliflozin-Metformin HCl Tab ER 24HR 12.5-1000 MG: ORAL | 90 days supply | Qty: 180 | Fill #1 | Status: AC

## 2024-05-07 MED FILL — Omeprazole Cap Delayed Release 20 MG: ORAL | 90 days supply | Qty: 90 | Fill #1 | Status: AC

## 2024-05-08 ENCOUNTER — Other Ambulatory Visit: Payer: Self-pay

## 2024-05-12 ENCOUNTER — Ambulatory Visit (HOSPITAL_BASED_OUTPATIENT_CLINIC_OR_DEPARTMENT_OTHER)

## 2024-05-13 ENCOUNTER — Other Ambulatory Visit: Payer: Self-pay

## 2024-05-30 ENCOUNTER — Inpatient Hospital Stay: Admitting: Hematology and Oncology

## 2024-05-30 ENCOUNTER — Telehealth: Payer: Self-pay | Admitting: Hematology and Oncology

## 2024-05-30 ENCOUNTER — Inpatient Hospital Stay: Attending: Hematology and Oncology

## 2024-05-30 ENCOUNTER — Other Ambulatory Visit: Payer: Self-pay

## 2024-05-30 ENCOUNTER — Other Ambulatory Visit: Payer: Self-pay | Admitting: Hematology and Oncology

## 2024-05-30 VITALS — BP 131/83 | HR 63 | Temp 98.2°F | Resp 16 | Ht 64.0 in | Wt 151.5 lb

## 2024-05-30 DIAGNOSIS — D509 Iron deficiency anemia, unspecified: Secondary | ICD-10-CM | POA: Diagnosis not present

## 2024-05-30 DIAGNOSIS — E611 Iron deficiency: Secondary | ICD-10-CM | POA: Insufficient documentation

## 2024-05-30 DIAGNOSIS — R5383 Other fatigue: Secondary | ICD-10-CM | POA: Insufficient documentation

## 2024-05-30 DIAGNOSIS — Z79899 Other long term (current) drug therapy: Secondary | ICD-10-CM | POA: Diagnosis not present

## 2024-05-30 DIAGNOSIS — R0602 Shortness of breath: Secondary | ICD-10-CM | POA: Diagnosis not present

## 2024-05-30 DIAGNOSIS — Z7982 Long term (current) use of aspirin: Secondary | ICD-10-CM | POA: Insufficient documentation

## 2024-05-30 LAB — CMP (CANCER CENTER ONLY)
ALT: 8 U/L (ref 0–44)
AST: 17 U/L (ref 15–41)
Albumin: 4.3 g/dL (ref 3.5–5.0)
Alkaline Phosphatase: 76 U/L (ref 38–126)
Anion gap: 14 (ref 5–15)
BUN: 13 mg/dL (ref 8–23)
CO2: 24 mmol/L (ref 22–32)
Calcium: 9.9 mg/dL (ref 8.9–10.3)
Chloride: 100 mmol/L (ref 98–111)
Creatinine: 0.78 mg/dL (ref 0.44–1.00)
GFR, Estimated: >60 mL/min (ref >60)
Glucose, Bld: 107 mg/dL — ABNORMAL HIGH (ref 70–99)
Potassium: 4.3 mmol/L (ref 3.5–5.1)
Sodium: 137 mmol/L (ref 135–145)
Total Bilirubin: 0.4 mg/dL (ref 0.0–1.2)
Total Protein: 7.3 g/dL (ref 6.5–8.1)

## 2024-05-30 LAB — CBC WITH DIFFERENTIAL (CANCER CENTER ONLY)
Abs Immature Granulocytes: 0.01 K/uL (ref 0.00–0.07)
Basophils Absolute: 0 K/uL (ref 0.0–0.1)
Basophils Relative: 1 %
Eosinophils Absolute: 0.1 K/uL (ref 0.0–0.5)
Eosinophils Relative: 2 %
HCT: 42.2 % (ref 36.0–46.0)
Hemoglobin: 14 g/dL (ref 12.0–15.0)
Immature Granulocytes: 0 %
Lymphocytes Relative: 22 %
Lymphs Abs: 1.2 K/uL (ref 0.7–4.0)
MCH: 27.1 pg (ref 26.0–34.0)
MCHC: 33.2 g/dL (ref 30.0–36.0)
MCV: 81.8 fL (ref 80.0–100.0)
Monocytes Absolute: 0.4 K/uL (ref 0.1–1.0)
Monocytes Relative: 8 %
Neutro Abs: 3.7 K/uL (ref 1.7–7.7)
Neutrophils Relative %: 67 %
Platelet Count: 410 K/uL — ABNORMAL HIGH (ref 150–400)
RBC: 5.16 MIL/uL — ABNORMAL HIGH (ref 3.87–5.11)
RDW: 17.9 % — ABNORMAL HIGH (ref 11.5–15.5)
WBC Count: 5.4 K/uL (ref 4.0–10.5)
nRBC: 0 % (ref 0.0–0.2)

## 2024-05-30 LAB — VITAMIN B12: Vitamin B-12: 236 pg/mL (ref 180–914)

## 2024-05-30 LAB — IRON AND TIBC
Iron: 69 ug/dL (ref 28–170)
Saturation Ratios: 18 % (ref 10.4–31.8)
TIBC: 384 ug/dL (ref 250–450)
UIBC: 315 ug/dL

## 2024-05-30 LAB — FOLATE: Folate: 13.6 ng/mL (ref >5.9)

## 2024-05-30 LAB — FERRITIN: Ferritin: 135 ng/mL (ref 11–307)

## 2024-05-30 LAB — TSH: TSH: 1.88 u[IU]/mL (ref 0.350–4.500)

## 2024-05-30 NOTE — Telephone Encounter (Signed)
 Patient has been scheduled for follow-up visit per 05/29/24 LOS.  Pt given an appt calendar with date and time.

## 2024-05-30 NOTE — Progress Notes (Signed)
 [facility] [address] [place],  [place]  [address] [phone]  Clinic Day:  05/30/2024   Referring physician: Sherre Clapper, MD  Patient Care Team: Patient Care Team: Sherre Clapper, MD as PCP - General (Family Medicine) Erasmo Bernardino BRAVO, OD (Optometry)   REASON FOR CONSULTATION:  Elevated Platelets  HISTORY OF PRESENT ILLNESS:   Lindsey [name] is a 68 y.o. female with a history of elevated platelets who is referred in consultation by Josette Sherre, MD for assessment and management. She was noted to have elevated platelets in March at 473. She reports symptoms including fatigue, shortness of breath upon exertion and pica of crushed ice. She denies fever, chills, nausea or vomiting. She denies chest pain or cough. She denies issue with bowel or bladder. She denies changes in appetite or weight loss. She is a never smoker and denies use of alcohol or drugs.    REVIEW OF SYSTEMS:   Review of Systems  Constitutional:  Positive for fatigue.  HENT:  Negative.    Eyes: Negative.   Respiratory:  Positive for shortness of breath.   Cardiovascular: Negative.   Gastrointestinal: Negative.   Endocrine: Negative.   Genitourinary: Negative.    Skin: Negative.   Neurological:  Positive for extremity weakness.  Hematological: Negative.   Psychiatric/Behavioral: Negative.       VITALS:   There were no vitals taken for this visit.  Wt Readings from Last 3 Encounters:  04/23/24 155 lb 12 oz (70.6 kg)  04/14/24 156 lb 3.2 oz (70.9 kg)  03/24/24 157 lb (71.2 kg)    There is no height or weight on file to calculate BMI.  Performance status (ECOG): 1 - Symptomatic but completely ambulatory  PHYSICAL EXAM:   Physical Exam Constitutional:      Appearance: Normal appearance. She is normal weight.  HENT:     Mouth/Throat:     Mouth: Mucous membranes are moist.  Cardiovascular:     Rate and Rhythm: Normal rate and regular rhythm.     Pulses: Normal pulses.      Heart sounds: Normal heart sounds.  Pulmonary:     Effort: Pulmonary effort is normal.     Breath sounds: Normal breath sounds.  Abdominal:     Palpations: Abdomen is soft.  Musculoskeletal:        General: Normal range of motion.     Cervical back: Normal range of motion.  Skin:    General: Skin is warm and dry.  Neurological:     General: No focal deficit present.     Mental Status: She is alert and oriented to person, place, and time. Mental status is at baseline.  Psychiatric:        Mood and Affect: Mood normal.        Behavior: Behavior normal.        Thought Content: Thought content normal.        Judgment: Judgment normal.      LABS:      Latest Ref Rng & Units 04/14/2024   12:25 PM 03/20/2024    7:54 AM 12/03/2023   10:17 AM  CBC  WBC 4.0 - 10.5 K/uL 9.1  5.3  6.6   Hemoglobin 12.0 - 15.0 g/dL 87.3  85.9  86.3   Hematocrit 36.0 - 46.0 % 40.3  43.4  42.6   Platelets 150 - 400 K/uL 439  498  474       Latest Ref Rng & Units 04/14/2024  12:25 PM 03/20/2024    7:54 AM 12/03/2023   10:17 AM  CMP  Glucose 70 - 99 mg/dL 884  98  94   BUN 8 - 23 mg/dL 14  13  18    Creatinine 0.44 - 1.00 mg/dL 9.17  9.04  9.17   Sodium 135 - 145 mmol/L 136  135  136   Potassium 3.5 - 5.1 mmol/L 4.0  4.3  4.7   Chloride 98 - 111 mmol/L 100  98  100   CO2 22 - 32 mmol/L 22  19  21    Calcium  8.9 - 10.3 mg/dL 9.8  89.5  89.8   Total Protein 6.5 - 8.1 g/dL 7.2  7.2  7.0   Total Bilirubin 0.0 - 1.2 mg/dL 0.4  0.4  0.3   Alkaline Phos 38 - 126 U/L 81  85  81   AST 15 - 41 U/L 19  21  18    ALT 0 - 44 U/L 12  15  12       No results found for: CEA1, CEA / No results found for: CEA1, CEA No results found for: PSA1 No results found for: CAN199 No results found for: CAN125  No results found for: TOTALPROTELP, ALBUMINELP, A1GS, A2GS, BETS, BETA2SER, GAMS, MSPIKE, SPEI Lab Results  Component Value Date   TIBC 531 (H) 04/14/2024   FERRITIN 5 (L) 04/14/2024    IRONPCTSAT 7 (L) 04/14/2024   No results found for: LDH  STUDIES:   No results found.    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 Abigail Free, LOV 12/19/21 in Epic as of 03/15/22.   Dyslipidemia associated with type 2 diabetes mellitus [facility])    Patient follows with Dr. Abigail Free, LOV 12/19/21 in Epic as of 03/15/22.   Frequent UTI     h/o since hysterectomy/04/01/13 asymptomatic   GERD (gastroesophageal reflux disease)    Takes Omeprazole  daily as of 03/15/22.   Headache(784.0)    migraines   Hypertension    Currently on Lisinopril . Follows w/ PCP Dr. Abigail Free, ARNETTA 12/19/21 as of 03/15/22.   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 Overview:  The patient in 09/2009 underwent TAHBSO with the findings of a ruptured 10 cm right ovarian mass. 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    Past Surgical History:  Procedure Laterality Date   ABDOMINAL HYSTERECTOMY  2011   Stromal ovarian cancer   ANTERIOR AND POSTERIOR REPAIR WITH SACROSPINOUS FIXATION N/A 03/27/2022   Procedure: ANTERIOR REPAIR WITH SACROSPINOUS FIXATION;  Surgeon: Marilynne Rosaline SAILOR, MD;  Location: [facility];  Service: Gynecology;  Laterality: N/A;   CHOLECYSTECTOMY  1989   COLONOSCOPY  2010   no polyps per pt   CYSTOSCOPY N/A 03/27/2022   Procedure: CYSTOSCOPY;  Surgeon: Marilynne Rosaline SAILOR, MD;  Location: [facility];  Service: Gynecology;  Laterality: N/A;   ENTEROCELE REPAIR     around 2007   KNEE ARTHROSCOPY Right    many yrs ago before knee replacemnt   KNEE ARTHROSCOPY Right 07/01/2014    Procedure: RIGHT  ARTHROSCOPY KNEE WITH SYNOVECTOMY;  Surgeon: Dempsey Melodi GAILS, MD;  Location: [facility];  Service: Orthopedics;  Laterality: Right;   RECTOCELE REPAIR     around 2007   TONSILLECTOMY  1975   TOTAL KNEE ARTHROPLASTY Right 04/07/2013   Procedure: RIGHT TOTAL KNEE ARTHROPLASTY;  Surgeon: Dempsey GAILS Melodi, MD;  Location: [facility];  Service: Orthopedics;  Laterality: Right;    Family History  Problem Relation Age of Onset   Hyperlipidemia Mother    Hypertension Mother    Transient ischemic attack Mother    Hyperlipidemia Father    Hypertension Father    Lung cancer Father    BRCA 1/2 Maternal Grandmother     Social History:  reports that she has never smoked. She has never used smokeless tobacco. She reports that she does not drink alcohol and does not use drugs.The patient is accompanied by husband today.  Allergies:  Allergies  Allergen Reactions   Amoxicillin Hives   Trulicity [Dulaglutide] Nausea And Vomiting   Citalopram Other (See Comments)    headache   Erythromycin Hives and Rash   Hydrocodone Other (See Comments)    Achy all over    Sertraline Other (See Comments)    irritability   Sulfa Antibiotics Rash    fever   Tramadol  Nausea Only and Palpitations   Trazodone And Nefazodone Other (See Comments)    Insomnia    Venlafaxine Other (See Comments)    migrane     Current Medications: Current Outpatient Medications  Medication Sig Dispense Refill   aspirin EC 81 MG tablet Take 81 mg by mouth every morning.     Calcium  Carb-Cholecalciferol (CALCIUM  600 + D PO) Take 1 tablet by mouth at bedtime.     diltiazem  (DILACOR XR ) 240 MG 24 hr capsule Take 1 capsule (240 mg total) by mouth daily. 90 capsule 1   Empagliflozin -metFORMIN  HCl ER (SYNJARDY  XR) 12.01-999 MG TB24 Take 2 tablets by mouth daily. 180 tablet 3   glucose blood (CONTOUR NEXT TEST) test strip Use as instructed 100 each 3   JUBLIA 10 % SOLN Apply topically.     Lancet Devices (MICROLET NEXT  LANCING DEVICE) MISC 1 each by Does not apply route in the morning. 1 each 0   lisinopril  (ZESTRIL ) 40 MG tablet TAKE 1 TABLET BY MOUTH TWICE  DAILY 180 tablet 3   LORazepam  (ATIVAN ) 0.5 MG tablet Take 1 tablet (0.5 mg total) by mouth 2 (two) times daily as needed for anxiety. 30 tablet 1   Magnesium 250 MG TABS Take by mouth in the morning and at bedtime.     omega-3 acid ethyl esters (LOVAZA ) 1 g capsule Take 2 capsules (2 g total) by mouth 2 (two) times daily. 360 capsule 3   omeprazole  (PRILOSEC) 20 MG capsule Take 1 capsule (20 mg total) by mouth daily before a meal. 90 capsule 3   ondansetron  (ZOFRAN ) 4 MG tablet Take 1 tablet (4 mg total) by mouth every 8 (eight) hours as needed for nausea or vomiting. 40 tablet 0   pravastatin  (PRAVACHOL ) 10 MG tablet Take 1 tablet (10 mg total) by mouth daily. 90 tablet 3   Probiotic Product (PROBIOTIC BLEND PO) Take by mouth daily.     Semaglutide ,0.25 or 0.5MG /DOS, (OZEMPIC , 0.25 OR 0.5 MG/DOSE,) 2 MG/3ML SOPN INJECT 0.5 MG INTO THE SKIN ONE TIME PER WEEK 6 mL 3   SUMAtriptan  (IMITREX ) 100 MG tablet TAKE 1 TABLET BY MOUTH AT ONSET  OF MIGRAINE MAY REPEAT IN  2  HOURS AS NEEDED. MAX OF 2  TABLETS DAILY 27 tablet 1   zolpidem  (AMBIEN  CR) 12.5 MG CR tablet Take 1 tablet (12.5 mg total) by mouth at bedtime. 90 tablet 1   No current facility-administered medications for this visit.     ASSESSMENT & PLAN:   Assessment:  Lindsey [name] is a 68 y.o. female is here for elevated platelets. She was found to be iron  deficient and received IV Venofer . She tolerated this well and notes improvement to her symptoms, mainly feeling less cold. Her iron  studies are pending from today and I will call her with results this afternoon.   Plan: 1.  Return to clinic in 3 months, sooner if needed.   I discussed the assessment and treatment plan with the patient.  The patient was provided an opportunity to ask questions and all were answered.  The patient agreed with the  plan and demonstrated an understanding of the instructions.    Thank you for the referral    20 minutes was spent in patient care.  This included time spent preparing to see the patient (e.g., review of tests), obtaining and/or reviewing separately obtained history, counseling and educating the patient/family/caregiver, ordering medications, tests, or procedures; documenting clinical information in the electronic or other health record, independently interpreting results and communicating results to the patient/family/caregiver as well as coordination of care.      Eleanor DELENA Bach, NP   Family Nurse Practitioner - Board Certified [facility] [place] [phone]

## 2024-06-16 NOTE — Progress Notes (Unsigned)
   06/16/2024  Patient ID: Lindsey [name], female   DOB: August 18, 1956, 68 y.o.   MRN: [id number]  Pharmacy Quality Measure Review  This patient is appearing on a report for being at risk of failing the adherence measure for cholesterol (statin) medications this calendar year.   Medication: pravastatin  10mg .  Due for refill 05/12/2024 per [facility] dispensing hx.   Patient stated she would like to have the medication refilled.   Contacted pharmacy to facilitate refills.  Lang Sieve, PharmD, BCGP Clinical Pharmacist  [phone]

## 2024-06-18 ENCOUNTER — Other Ambulatory Visit: Payer: Self-pay | Admitting: Family Medicine

## 2024-06-18 ENCOUNTER — Other Ambulatory Visit (HOSPITAL_COMMUNITY): Payer: Self-pay

## 2024-06-18 ENCOUNTER — Other Ambulatory Visit: Payer: Self-pay

## 2024-06-18 DIAGNOSIS — I152 Hypertension secondary to endocrine disorders: Secondary | ICD-10-CM

## 2024-06-18 MED ORDER — DILTIAZEM HCL ER 240 MG PO CP24
240.0000 mg | ORAL_CAPSULE | Freq: Every day | ORAL | 1 refills | Status: AC
  Filled 2024-06-18: qty 90, 90d supply, fill #0
  Filled 2024-09-22: qty 90, 90d supply, fill #1

## 2024-06-18 MED ORDER — QULIPTA 60 MG PO TABS
60.0000 mg | ORAL_TABLET | Freq: Every day | ORAL | 3 refills | Status: AC
  Filled 2024-06-18: qty 90, 90d supply, fill #0
  Filled 2024-09-18: qty 30, 30d supply, fill #1

## 2024-06-19 ENCOUNTER — Other Ambulatory Visit: Payer: Self-pay

## 2024-06-19 ENCOUNTER — Other Ambulatory Visit (HOSPITAL_COMMUNITY): Payer: Self-pay

## 2024-06-19 MED FILL — Pravastatin Sodium Tab 10 MG: ORAL | 90 days supply | Qty: 90 | Fill #1 | Status: AC

## 2024-06-24 NOTE — Assessment & Plan Note (Signed)
 SABRA

## 2024-06-24 NOTE — Progress Notes (Unsigned)
  error

## 2024-06-25 ENCOUNTER — Ambulatory Visit: Payer: Self-pay | Admitting: Family Medicine

## 2024-06-25 ENCOUNTER — Other Ambulatory Visit (HOSPITAL_BASED_OUTPATIENT_CLINIC_OR_DEPARTMENT_OTHER): Payer: Self-pay

## 2024-06-25 ENCOUNTER — Ambulatory Visit: Admitting: Family Medicine

## 2024-06-25 VITALS — BP 130/74 | HR 88 | Temp 98.2°F | Ht 64.0 in | Wt 153.0 lb

## 2024-06-25 DIAGNOSIS — N3001 Acute cystitis with hematuria: Secondary | ICD-10-CM | POA: Diagnosis not present

## 2024-06-25 DIAGNOSIS — G43009 Migraine without aura, not intractable, without status migrainosus: Secondary | ICD-10-CM | POA: Diagnosis not present

## 2024-06-25 DIAGNOSIS — E782 Mixed hyperlipidemia: Secondary | ICD-10-CM | POA: Diagnosis not present

## 2024-06-25 DIAGNOSIS — Z23 Encounter for immunization: Secondary | ICD-10-CM

## 2024-06-25 DIAGNOSIS — I10 Essential (primary) hypertension: Secondary | ICD-10-CM | POA: Diagnosis not present

## 2024-06-25 DIAGNOSIS — E1121 Type 2 diabetes mellitus with diabetic nephropathy: Secondary | ICD-10-CM

## 2024-06-25 DIAGNOSIS — D508 Other iron deficiency anemias: Secondary | ICD-10-CM

## 2024-06-25 DIAGNOSIS — B351 Tinea unguium: Secondary | ICD-10-CM | POA: Diagnosis not present

## 2024-06-25 LAB — POCT URINALYSIS DIP (CLINITEK)
Glucose, UA: NEGATIVE mg/dL
Nitrite, UA: POSITIVE — AB
POC PROTEIN,UA: 30 — AB
Spec Grav, UA: >=1.03 — AB (ref 1.010–1.025)
Urobilinogen, UA: 0.2 U/dL
pH, UA: 6 (ref 5.0–8.0)

## 2024-06-25 LAB — POCT GLYCOSYLATED HEMOGLOBIN (HGB A1C): HbA1c POC (<> result, manual entry): 5.5 % (ref 4.0–5.6)

## 2024-06-25 MED ORDER — CIPROFLOXACIN HCL 250 MG PO TABS
250.0000 mg | ORAL_TABLET | Freq: Two times a day (BID) | ORAL | 0 refills | Status: AC
  Filled 2024-06-25: qty 10, 5d supply, fill #0

## 2024-06-25 MED ORDER — LISINOPRIL 40 MG PO TABS: 40.0000 mg | ORAL_TABLET | Freq: Every day | ORAL | Status: DC

## 2024-06-25 NOTE — Assessment & Plan Note (Addendum)
Managed with pravastatin. 

## 2024-06-25 NOTE — Progress Notes (Signed)
 Subjective:  Patient ID: Lindsey [name], female    DOB: June 18, 1956  Age: 68 y.o. MRN: [id number]  Chief Complaint  Patient presents with   Medical Management of Chronic Issues    HPI: Discussed the use of AI scribe software for clinical note transcription with the patient, who gave verbal consent to proceed.  History of Present Illness Lindsey [name] is a 68 year old female who presents for follow-up on her iron  infusions and diabetes management.  Iron  deficiency and iron  infusions - Receiving iron  infusions at the cancer center for previously low iron  levels - Iron  levels have improved following infusions - Improved energy and decreased cold intolerance since starting infusions - Uncertain underlying cause of iron  deficiency - May require yearly iron  infusions  Diabetes mellitus management - Takes Synjardy  XR 12.01/999 mg twice daily and Ozempic  0.5 mg - Recent A1c is 5.5, improved from previous 6.1 - Attributes improved glycemic control to better dietary habits  Migraine headaches - Takes Qulipta  for migraine prophylaxis, resulting in reduced frequency of headaches but not complete resolution - Uses sumatriptan  as needed for acute migraine attacks, which is effective  Urinary tract symptoms - Burning and urgency with urination for several days - Difficulty with effective urination - No prescription medication used for this episode - Took over-the-counter Aviane  Fingernail infection - Using Jublia for approximately three months following a six-month course of Diflucan  - Some improvement in nail appearance, though progress is slow - Nail initially turned purple, now improving as it grows out  Gastrointestinal symptoms - Bloating present - No chest pain or breathing problems  Antihypertensive and other chronic medications - Takes lisinopril  40 mg once daily for blood pressure - Takes diltiazem  240 mg once daily - Takes pravastatin  10 mg once daily for cholesterol -  Takes magnesium 250 mg twice daily - Takes omeprazole  for acid reflux - Takes aspirin 81 mg daily  Sleep and anxiety symptoms - Takes Ambien  CR at night for sleep - Uses lorazepam  0.5 mg twice daily as needed for stress - Experiences irritability after several days of lorazepam  use  Physical activity - No regular exercise currently - Interested in starting a fitness routine, possibly at a local gym covered by Medicare       06/25/2024    1:45 PM 05/30/2024    8:00 AM 04/21/2024    9:00 AM 04/14/2024   12:58 PM 03/24/2024    2:01 PM  Depression screen PHQ 2/9  Decreased Interest 0 0 0 0 0  Down, Depressed, Hopeless 0 0 0 0 0  PHQ - 2 Score 0 0 0 0 0  Altered sleeping 0    1  Tired, decreased energy 0    0  Change in appetite 0    0  Feeling bad or failure about yourself  0    0  Trouble concentrating 0    0  Moving slowly or fidgety/restless 0    0  Suicidal thoughts 0    0  PHQ-9 Score 0    1  Difficult doing work/chores Not difficult at all    Not difficult at all        12/03/2023    9:44 AM  Fall Risk   Falls in the past year? 0  Number falls in past yr: 0  Injury with Fall? 0  Risk for fall due to : No Fall Risks  Follow up Falls evaluation completed    Patient Care Team: Sherre Clapper,  MD as PCP - General (Family Medicine) Snipes, Bernardino BRAVO, OD (Optometry)   Review of Systems  Constitutional:  Negative for chills, fatigue and fever.  HENT:  Negative for congestion, ear pain and sore throat.   Respiratory:  Negative for cough and shortness of breath.   Cardiovascular:  Negative for chest pain.  Gastrointestinal:  Negative for abdominal pain, constipation, diarrhea, nausea and vomiting.  Genitourinary:  Positive for dysuria, frequency and urgency.  Musculoskeletal:  Negative for arthralgias and myalgias.  Skin:  Negative for rash.  Neurological:  Negative for dizziness and headaches.  Psychiatric/Behavioral:  Negative for dysphoric mood. The patient is not  nervous/anxious.     Current Outpatient Medications on File Prior to Visit  Medication Sig Dispense Refill   aspirin EC 81 MG tablet Take 81 mg by mouth every morning.     Atogepant  (QULIPTA ) 60 MG TABS Take 1 tablet (60 mg total) by mouth daily. 90 tablet 3   Calcium  Carb-Cholecalciferol (CALCIUM  600 + D PO) Take 1 tablet by mouth at bedtime.     diltiazem  (DILACOR XR ) 240 MG 24 hr capsule Take 1 capsule (240 mg total) by mouth daily. 90 capsule 1   Empagliflozin -metFORMIN  HCl ER (SYNJARDY  XR) 12.01-999 MG TB24 Take 2 tablets by mouth daily. 180 tablet 3   glucose blood (CONTOUR NEXT TEST) test strip Use as instructed 100 each 3   JUBLIA 10 % SOLN Apply topically.     Lancet Devices (MICROLET NEXT LANCING DEVICE) MISC 1 each by Does not apply route in the morning. 1 each 0   LORazepam  (ATIVAN ) 0.5 MG tablet Take 1 tablet (0.5 mg total) by mouth 2 (two) times daily as needed for anxiety. 30 tablet 1   Magnesium 250 MG TABS Take by mouth in the morning and at bedtime.     omega-3 acid ethyl esters (LOVAZA ) 1 g capsule Take 2 capsules (2 g total) by mouth 2 (two) times daily. 360 capsule 3   omeprazole  (PRILOSEC) 20 MG capsule Take 1 capsule (20 mg total) by mouth daily before a meal. 90 capsule 3   ondansetron  (ZOFRAN ) 4 MG tablet Take 1 tablet (4 mg total) by mouth every 8 (eight) hours as needed for nausea or vomiting. 40 tablet 0   pravastatin  (PRAVACHOL ) 10 MG tablet Take 1 tablet (10 mg total) by mouth daily. 90 tablet 3   Probiotic Product (PROBIOTIC BLEND PO) Take by mouth daily.     Semaglutide ,0.25 or 0.5MG /DOS, (OZEMPIC , 0.25 OR 0.5 MG/DOSE,) 2 MG/3ML SOPN INJECT 0.5 MG INTO THE SKIN ONE TIME PER WEEK 6 mL 3   SUMAtriptan  (IMITREX ) 100 MG tablet TAKE 1 TABLET BY MOUTH AT ONSET  OF MIGRAINE MAY REPEAT IN 2  HOURS AS NEEDED. MAX OF 2  TABLETS DAILY 27 tablet 1   zolpidem  (AMBIEN  CR) 12.5 MG CR tablet Take 1 tablet (12.5 mg total) by mouth at bedtime. 90 tablet 1   No current  facility-administered medications on file prior to visit.   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 Abigail Free, LOV 12/19/21 in Epic as of 03/15/22.   Dyslipidemia associated with type 2 diabetes mellitus [facility])    Patient follows with Dr. Abigail Free, LOV 12/19/21 in  Epic as of 03/15/22.   Frequent UTI     h/o since hysterectomy/04/01/13 asymptomatic   GERD (gastroesophageal reflux disease)    Takes Omeprazole  daily as of 03/15/22.   Headache(784.0)    migraines   Hypertension    Currently on Lisinopril . Follows w/ PCP Dr. Abigail Free, ARNETTA 12/19/21 as of 03/15/22.   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 Overview:  The patient in 09/2009 underwent TAHBSO with the findings of a ruptured 10 cm right ovarian mass. 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   Past Surgical History:  Procedure Laterality Date   ABDOMINAL HYSTERECTOMY  2011   Stromal ovarian cancer   ANTERIOR AND POSTERIOR REPAIR WITH SACROSPINOUS FIXATION N/A 03/27/2022   Procedure: ANTERIOR REPAIR WITH SACROSPINOUS FIXATION;  Surgeon: Marilynne Rosaline SAILOR, MD;  Location: [facility];  Service: Gynecology;  Laterality: N/A;   CHOLECYSTECTOMY  1989   COLONOSCOPY  2010   no polyps per pt   CYSTOSCOPY N/A 03/27/2022   Procedure: CYSTOSCOPY;  Surgeon: Marilynne Rosaline SAILOR, MD;  Location: [facility];  Service: Gynecology;  Laterality: N/A;   ENTEROCELE REPAIR     around 2007   KNEE ARTHROSCOPY Right    many yrs ago before knee replacemnt   KNEE ARTHROSCOPY Right 07/01/2014   Procedure: RIGHT ARTHROSCOPY KNEE WITH SYNOVECTOMY;  Surgeon:  Dempsey Melodi GAILS, MD;  Location: [facility];  Service: Orthopedics;  Laterality: Right;   RECTOCELE REPAIR     around 2007   TONSILLECTOMY  1975   TOTAL KNEE ARTHROPLASTY Right 04/07/2013   Procedure: RIGHT TOTAL KNEE ARTHROPLASTY;  Surgeon: Dempsey GAILS Melodi, MD;  Location: [facility];  Service: Orthopedics;  Laterality: Right;    Family History  Problem Relation Age of Onset   Hyperlipidemia Mother    Hypertension Mother    Transient ischemic attack Mother    Hyperlipidemia Father    Hypertension Father    Lung cancer Father    BRCA 1/2 Maternal Grandmother    Social History   Socioeconomic History   Marital status: Married    Spouse name: Not on file   Number of children: Not on file   Years of education: Not on file   Highest education level: Some college, no degree  Occupational History   Occupation: Retired  Tobacco Use   Smoking status: Never   Smokeless tobacco: Never  Vaping Use   Vaping status: Never Used  Substance and Sexual Activity   Alcohol use: No   Drug use: No   Sexual activity: Yes    Birth control/protection: Surgical    Comment: hysterectomy  Other Topics Concern   Not on file  Social History Narrative   Not on file   Social Drivers of Health   Financial Resource Strain: Low Risk  (09/24/2023)   Overall Financial Resource Strain (CARDIA)    Difficulty of Paying Living Expenses: Not hard at all  Food Insecurity: No Food Insecurity (09/24/2023)   Hunger Vital Sign    Worried About Running Out of Food in the Last Year: Never true    Ran Out of Food in the Last Year: Never true  Transportation Needs: No Transportation Needs (09/24/2023)   PRAPARE - Administrator, Civil Service (Medical): No    Lack of Transportation (Non-Medical): No  Physical Activity: Insufficiently Active (09/24/2023)  Exercise Vital Sign    Days of Exercise per Week: 3 days    Minutes of Exercise per Session: 20 min  Stress: No Stress Concern Present (09/24/2023)    [employer] of Occupational Health - Occupational Stress Questionnaire    Feeling of Stress : Only a little  Social Connections: Socially Integrated (09/24/2023)   Social Connection and Isolation Panel    Frequency of Communication with Friends and Family: More than three times a week    Frequency of Social Gatherings with Friends and Family: Once a week    Attends Religious Services: More than 4 times per year    Active Member of [employer] or Organizations: Yes    Attends Engineer, Structural: More than 4 times per year    Marital Status: Married    Objective:  BP 130/74   Pulse 88   Temp 98.2 F (36.8 C)   Ht 5' 4 (1.626 m)   Wt 153 lb (69.4 kg)   SpO2 96%   BMI 26.26 kg/m      06/25/2024    1:45 PM 05/30/2024    8:30 AM 04/24/2024    8:56 AM  BP/Weight  Systolic BP 130 131 123  Diastolic BP 74 83 83  Wt. (Lbs) 153 151.5   BMI 26.26 kg/m2 26 kg/m2     Physical Exam Vitals reviewed.  Constitutional:      Appearance: Normal appearance. She is normal weight.  Neck:     Vascular: No carotid bruit.  Cardiovascular:     Rate and Rhythm: Normal rate and regular rhythm.     Pulses: Normal pulses.     Heart sounds: Normal heart sounds.  Pulmonary:     Effort: Pulmonary effort is normal. No respiratory distress.     Breath sounds: Normal breath sounds.  Abdominal:     General: Abdomen is flat. Bowel sounds are normal.     Palpations: Abdomen is soft.     Tenderness: There is no abdominal tenderness.  Neurological:     Mental Status: She is alert and oriented to person, place, and time.  Psychiatric:        Mood and Affect: Mood normal.        Behavior: Behavior normal.      Diabetic foot exam was performed with the following findings:   No deformities, ulcerations, or other skin breakdown Normal sensation of 10g monofilament Intact posterior tibialis and dorsalis pedis pulses      Lab Results  Component Value Date   WBC 5.4 05/30/2024   HGB 14.0  05/30/2024   HCT 42.2 05/30/2024   PLT 410 (H) 05/30/2024   GLUCOSE 107 (H) 05/30/2024   CHOL 136 03/20/2024   TRIG 110 03/20/2024   HDL 66 03/20/2024   LDLCALC 50 03/20/2024   ALT 8 05/30/2024   AST 17 05/30/2024   NA 137 05/30/2024   K 4.3 05/30/2024   CL 100 05/30/2024   CREATININE 0.78 05/30/2024   BUN 13 05/30/2024   CO2 24 05/30/2024   TSH 1.880 05/30/2024   INR 0.92 04/01/2013   HGBA1C 5.5 06/25/2024    Results for orders placed or performed in visit on 06/25/24  POCT URINALYSIS DIP (CLINITEK)   Collection Time: 06/25/24  1:51 PM  Result Value Ref Range   Color, UA brown (A) yellow   Clarity, UA cloudy (A) clear   Glucose, UA negative negative mg/dL   Bilirubin, UA small (A) negative   Ketones, POC UA moderate (  40) (A) negative mg/dL   Spec Grav, UA >=8.969 (A) 1.010 - 1.025   Blood, UA moderate (A) negative   pH, UA 6.0 5.0 - 8.0   POC PROTEIN,UA =30 (A) negative, trace   Urobilinogen, UA 0.2 0.2 or 1.0 E.U./dL   Nitrite, UA Positive (A) Negative   Leukocytes, UA Moderate (2+) (A) Negative  POCT glycosylated hemoglobin (Hb A1C)   Collection Time: 06/25/24  2:16 PM  Result Value Ref Range   Hemoglobin A1C     HbA1c POC (<> result, manual entry) 5.5 4.0 - 5.6 %   HbA1c, POC (prediabetic range)     HbA1c, POC (controlled diabetic range)    .  Assessment & Plan:   Assessment & Plan Mixed hyperlipidemia Managed with pravastatin .    Essential hypertension, benign Hypertension well controlled.  Stable on current regimen of Lisinopril  and Diltiazem .  -Monitor blood pressure at home. -BP 130/74 mmHg.     Diabetic glomerulopathy (HCC)  Diabetes excellent controlled Recommend check sugars fasting daily. Recommend check feet daily. Recommend annual eye exams. Medicines: Synjardy  XR 12.01/999 mg 2 daily, Ozempic  0.5 mg weekly and aspirin 81 mg daily. Continue to work on eating a healthy diet and exercise.  A1c improved from 6.1 to 5.5 .  Orders:    POCT glycosylated hemoglobin (Hb A1C)   lisinopril  (ZESTRIL ) 40 MG tablet; Take 1 tablet (40 mg total) by mouth daily.  Acute cystitis with hematuria Acute UTI with burning and urgency. OTC Aviane used. - Send urine for culture. - Prescribe ciprofloxacin . - Send prescription to [facility] pharmacy on [address]. Orders:   POCT URINALYSIS DIP (CLINITEK)   Urine Culture  Migraine without aura and without status migrainosus, not intractable Improved with Qulipta . Sumatriptan  effective for acute attacks.    Other iron  deficiency anemia Improved hemoglobin levels post iron  infusions. - Follow-up with the cancer center in six months.    Onychomycosis Onychomycosis (fingernail fungal infection) Chronic onychomycosis with some improvement on Jublia. Previous Diflucan  not fully effe    Encounter for immunization  Orders:   Flu vaccine HIGH DOSE PF(Fluzone Trivalent)  Body mass index is 26.26 kg/m.   Meds ordered this encounter  Medications   lisinopril  (ZESTRIL ) 40 MG tablet    Sig: Take 1 tablet (40 mg total) by mouth daily.   ciprofloxacin  (CIPRO ) 250 MG tablet    Sig: Take 1 tablet (250 mg total) by mouth 2 (two) times daily for 5 days.    Dispense:  10 tablet    Refill:  0    Orders Placed This Encounter  Procedures   Urine Culture   Flu vaccine HIGH DOSE PF(Fluzone Trivalent)   POCT glycosylated hemoglobin (Hb A1C)   POCT URINALYSIS DIP (CLINITEK)     I,Marla I Leal-Borjas,acting as a scribe for Abigail Free, MD.,have documented all relevant documentation on the behalf of Abigail Free, MD,as directed by  Abigail Free, MD while in the presence of Abigail Free, MD.   Follow-up: Return in about 6 months (around 12/24/2024) for chronic follow up.  An After Visit Summary was printed and given to the patient.  Abigail Free, MD [facility] [phone]

## 2024-06-25 NOTE — Assessment & Plan Note (Addendum)
  Diabetes excellent controlled Recommend check sugars fasting daily. Recommend check feet daily. Recommend annual eye exams. Medicines: Synjardy  XR 12.01/999 mg 2 daily, Ozempic  0.5 mg weekly and aspirin 81 mg daily. Continue to work on eating a healthy diet and exercise.  A1c improved from 6.1 to 5.5 .  Orders:   POCT glycosylated hemoglobin (Hb A1C)   lisinopril  (ZESTRIL ) 40 MG tablet; Take 1 tablet (40 mg total) by mouth daily.

## 2024-06-26 DIAGNOSIS — E782 Mixed hyperlipidemia: Secondary | ICD-10-CM | POA: Diagnosis not present

## 2024-06-28 ENCOUNTER — Encounter: Payer: Self-pay | Admitting: Family Medicine

## 2024-06-28 DIAGNOSIS — B351 Tinea unguium: Secondary | ICD-10-CM | POA: Insufficient documentation

## 2024-06-28 NOTE — Assessment & Plan Note (Addendum)
 Improved hemoglobin levels post iron  infusions. - Follow-up with the cancer center in six months.

## 2024-06-28 NOTE — Assessment & Plan Note (Addendum)
 Acute UTI with burning and urgency. OTC Aviane used. - Send urine for culture. - Prescribe ciprofloxacin . - Send prescription to [facility] pharmacy on [address]. Orders:   POCT URINALYSIS DIP (CLINITEK)   Urine Culture

## 2024-06-28 NOTE — Assessment & Plan Note (Addendum)
 Improved with Qulipta . Sumatriptan  effective for acute attacks.

## 2024-06-28 NOTE — Assessment & Plan Note (Addendum)
 Hypertension well controlled.  Stable on current regimen of Lisinopril  and Diltiazem .  -Monitor blood pressure at home. -BP 130/74 mmHg.

## 2024-06-28 NOTE — Assessment & Plan Note (Addendum)
 Onychomycosis (fingernail fungal infection) Chronic onychomycosis with some improvement on Jublia. Previous Diflucan  not fully effe

## 2024-06-30 LAB — URINE CULTURE

## 2024-08-02 ENCOUNTER — Other Ambulatory Visit (HOSPITAL_COMMUNITY): Payer: Self-pay

## 2024-08-18 ENCOUNTER — Other Ambulatory Visit (HOSPITAL_COMMUNITY): Payer: Self-pay

## 2024-08-18 ENCOUNTER — Other Ambulatory Visit: Payer: Self-pay

## 2024-08-18 ENCOUNTER — Other Ambulatory Visit: Payer: Self-pay | Admitting: Family Medicine

## 2024-08-18 DIAGNOSIS — E1121 Type 2 diabetes mellitus with diabetic nephropathy: Secondary | ICD-10-CM

## 2024-08-18 MED FILL — Omeprazole Cap Delayed Release 20 MG: ORAL | 90 days supply | Qty: 90 | Fill #2 | Status: AC

## 2024-08-18 MED FILL — Omega-3-acid Ethyl Esters Cap 1 GM: ORAL | 90 days supply | Qty: 360 | Fill #2 | Status: AC

## 2024-08-18 MED FILL — Empagliflozin-Metformin HCl Tab ER 24HR 12.5-1000 MG: ORAL | 90 days supply | Qty: 180 | Fill #2 | Status: AC

## 2024-08-19 ENCOUNTER — Other Ambulatory Visit (HOSPITAL_COMMUNITY): Payer: Self-pay

## 2024-08-19 ENCOUNTER — Other Ambulatory Visit: Payer: Self-pay

## 2024-08-19 MED ORDER — LISINOPRIL 40 MG PO TABS
40.0000 mg | ORAL_TABLET | Freq: Every day | ORAL | 3 refills | Status: AC
  Filled 2024-08-19: qty 90, 90d supply, fill #0

## 2024-08-19 MED ORDER — ZOLPIDEM TARTRATE ER 12.5 MG PO TBCR
12.5000 mg | EXTENDED_RELEASE_TABLET | Freq: Every day | ORAL | 1 refills | Status: AC
  Filled 2024-08-19: qty 90, 90d supply, fill #0

## 2024-08-25 ENCOUNTER — Other Ambulatory Visit (HOSPITAL_BASED_OUTPATIENT_CLINIC_OR_DEPARTMENT_OTHER): Payer: Self-pay

## 2024-08-25 MED ORDER — CHLORHEXIDINE GLUCONATE 0.12 % MT SOLN
15.0000 mL | Freq: Two times a day (BID) | OROMUCOSAL | 0 refills | Status: AC
  Filled 2024-08-25: qty 473, 16d supply, fill #0

## 2024-08-25 MED ORDER — CLINDAMYCIN HCL 300 MG PO CAPS
300.0000 mg | ORAL_CAPSULE | Freq: Four times a day (QID) | ORAL | 0 refills | Status: AC
  Filled 2024-08-25: qty 28, 7d supply, fill #0

## 2024-08-31 ENCOUNTER — Encounter: Payer: Self-pay | Admitting: Family Medicine

## 2024-09-01 ENCOUNTER — Other Ambulatory Visit: Payer: Self-pay

## 2024-09-01 ENCOUNTER — Encounter: Payer: Self-pay | Admitting: Hematology and Oncology

## 2024-09-01 ENCOUNTER — Inpatient Hospital Stay

## 2024-09-01 ENCOUNTER — Inpatient Hospital Stay: Attending: Hematology and Oncology | Admitting: Hematology and Oncology

## 2024-09-01 ENCOUNTER — Telehealth: Payer: Self-pay | Admitting: Hematology and Oncology

## 2024-09-01 ENCOUNTER — Other Ambulatory Visit: Payer: Self-pay | Admitting: Hematology and Oncology

## 2024-09-01 ENCOUNTER — Other Ambulatory Visit (HOSPITAL_COMMUNITY): Payer: Self-pay

## 2024-09-01 VITALS — BP 135/82 | HR 59 | Temp 98.0°F | Resp 16 | Ht 64.0 in | Wt 151.1 lb

## 2024-09-01 DIAGNOSIS — E611 Iron deficiency: Secondary | ICD-10-CM | POA: Diagnosis present

## 2024-09-01 DIAGNOSIS — Z7982 Long term (current) use of aspirin: Secondary | ICD-10-CM | POA: Diagnosis not present

## 2024-09-01 DIAGNOSIS — R0602 Shortness of breath: Secondary | ICD-10-CM | POA: Diagnosis not present

## 2024-09-01 DIAGNOSIS — D509 Iron deficiency anemia, unspecified: Secondary | ICD-10-CM

## 2024-09-01 DIAGNOSIS — Z79899 Other long term (current) drug therapy: Secondary | ICD-10-CM | POA: Insufficient documentation

## 2024-09-01 DIAGNOSIS — R5383 Other fatigue: Secondary | ICD-10-CM | POA: Diagnosis not present

## 2024-09-01 LAB — CBC WITH DIFFERENTIAL (CANCER CENTER ONLY)
Abs Immature Granulocytes: 0.01 K/uL (ref 0.00–0.07)
Basophils Absolute: 0 K/uL (ref 0.0–0.1)
Basophils Relative: 1 %
Eosinophils Absolute: 0.1 K/uL (ref 0.0–0.5)
Eosinophils Relative: 2 %
HCT: 44.3 % (ref 36.0–46.0)
Hemoglobin: 15.1 g/dL — ABNORMAL HIGH (ref 12.0–15.0)
Immature Granulocytes: 0 %
Lymphocytes Relative: 26 %
Lymphs Abs: 1.3 K/uL (ref 0.7–4.0)
MCH: 29.2 pg (ref 26.0–34.0)
MCHC: 34.1 g/dL (ref 30.0–36.0)
MCV: 85.5 fL (ref 80.0–100.0)
Monocytes Absolute: 0.5 K/uL (ref 0.1–1.0)
Monocytes Relative: 10 %
Neutro Abs: 3 K/uL (ref 1.7–7.7)
Neutrophils Relative %: 61 %
Platelet Count: 380 K/uL (ref 150–400)
RBC: 5.18 MIL/uL — ABNORMAL HIGH (ref 3.87–5.11)
RDW: 13.9 % (ref 11.5–15.5)
WBC Count: 4.9 K/uL (ref 4.0–10.5)
nRBC: 0 % (ref 0.0–0.2)

## 2024-09-01 LAB — IRON AND TIBC
Iron: 97 ug/dL (ref 28–170)
Saturation Ratios: 24 % (ref 10.4–31.8)
TIBC: 409 ug/dL (ref 250–450)
UIBC: 312 ug/dL

## 2024-09-01 LAB — CMP (CANCER CENTER ONLY)
ALT: <5 U/L (ref 0–44)
AST: 17 U/L (ref 15–41)
Albumin: 4.7 g/dL (ref 3.5–5.0)
Alkaline Phosphatase: 68 U/L (ref 38–126)
Anion gap: 12 (ref 5–15)
BUN: 16 mg/dL (ref 8–23)
CO2: 26 mmol/L (ref 22–32)
Calcium: 10.5 mg/dL — ABNORMAL HIGH (ref 8.9–10.3)
Chloride: 98 mmol/L (ref 98–111)
Creatinine: 0.89 mg/dL (ref 0.44–1.00)
GFR, Estimated: >60 mL/min
Glucose, Bld: 93 mg/dL (ref 70–99)
Potassium: 4.4 mmol/L (ref 3.5–5.1)
Sodium: 136 mmol/L (ref 135–145)
Total Bilirubin: 0.6 mg/dL (ref 0.0–1.2)
Total Protein: 7.3 g/dL (ref 6.5–8.1)

## 2024-09-01 LAB — TSH: TSH: 2.31 u[IU]/mL (ref 0.350–4.500)

## 2024-09-01 LAB — FOLATE: Folate: 15.3 ng/mL

## 2024-09-01 LAB — FERRITIN: Ferritin: 153 ng/mL (ref 11–307)

## 2024-09-01 LAB — VITAMIN B12: Vitamin B-12: 239 pg/mL (ref 180–914)

## 2024-09-01 MED ORDER — CONTOUR NEXT TEST VI STRP
ORAL_STRIP | 3 refills | Status: AC
  Filled 2024-09-01: qty 100, 100d supply, fill #0

## 2024-09-01 MED ORDER — MICROLET NEXT LANCING DEVICE MISC
1.0000 | Freq: Every morning | 0 refills | Status: AC
  Filled 2024-09-01: qty 1, 30d supply, fill #0

## 2024-09-01 MED ORDER — CONTOUR NEXT TEST VI STRP
ORAL_STRIP | 3 refills | Status: DC
  Filled 2024-09-01: qty 100, fill #0

## 2024-09-01 MED ORDER — MICROLET NEXT LANCING DEVICE MISC
1.0000 | Freq: Every morning | 0 refills | Status: DC
  Filled 2024-09-01: qty 1, fill #0

## 2024-09-01 NOTE — Progress Notes (Signed)
 " [facility] [address] [place],  [place]  [address] [phone]  Clinic Day:  09/01/2024   Referring physician: Sherre Clapper, MD  Patient Care Team: Patient Care Team: Sherre Clapper, MD as PCP - General (Family Medicine) Erasmo Bernardino BRAVO, OD (Optometry)   REASON FOR CONSULTATION:  Elevated Platelets  HISTORY OF PRESENT ILLNESS:   Lindsey [name] is a 68 y.o. female with a history of elevated platelets who is referred in consultation by Josette Sherre, MD for assessment and management. She was noted to have elevated platelets in March at 473. She reports symptoms including fatigue, shortness of breath upon exertion and pica of crushed ice. She denies fever, chills, nausea or vomiting. She denies chest pain or cough. She denies issue with bowel or bladder. She denies changes in appetite or weight loss. She is a never smoker and denies use of alcohol or drugs.    REVIEW OF SYSTEMS:   Review of Systems  Constitutional:  Positive for fatigue.  HENT:  Negative.    Eyes: Negative.   Respiratory:  Positive for shortness of breath.   Cardiovascular: Negative.   Gastrointestinal: Negative.   Endocrine: Negative.   Genitourinary: Negative.    Skin: Negative.   Neurological:  Positive for extremity weakness.  Hematological: Negative.   Psychiatric/Behavioral: Negative.       VITALS:   There were no vitals taken for this visit.  Wt Readings from Last 3 Encounters:  06/25/24 153 lb (69.4 kg)  05/30/24 151 lb 8 oz (68.7 kg)  04/23/24 155 lb 12 oz (70.6 kg)    There is no height or weight on file to calculate BMI.  Performance status (ECOG): 1 - Symptomatic but completely ambulatory  PHYSICAL EXAM:   Physical Exam Constitutional:      Appearance: Normal appearance. She is normal weight.  HENT:     Mouth/Throat:     Mouth: Mucous membranes are moist.  Cardiovascular:     Rate and Rhythm: Normal rate and regular rhythm.     Pulses: Normal pulses.      Heart sounds: Normal heart sounds.  Pulmonary:     Effort: Pulmonary effort is normal.     Breath sounds: Normal breath sounds.  Abdominal:     Palpations: Abdomen is soft.  Musculoskeletal:        General: Normal range of motion.     Cervical back: Normal range of motion.  Skin:    General: Skin is warm and dry.  Neurological:     General: No focal deficit present.     Mental Status: She is alert and oriented to person, place, and time. Mental status is at baseline.  Psychiatric:        Mood and Affect: Mood normal.        Behavior: Behavior normal.        Thought Content: Thought content normal.        Judgment: Judgment normal.      LABS:      Latest Ref Rng & Units 09/01/2024    8:04 AM 05/30/2024    8:12 AM 04/14/2024   12:25 PM  CBC  WBC 4.0 - 10.5 K/uL 4.9  5.4  9.1   Hemoglobin 12.0 - 15.0 g/dL 84.8  85.9  87.3   Hematocrit 36.0 - 46.0 % 44.3  42.2  40.3   Platelets 150 - 400 K/uL 380  410  439       Latest Ref Rng &  Units 05/30/2024    8:12 AM 04/14/2024   12:25 PM 03/20/2024    7:54 AM  CMP  Glucose 70 - 99 mg/dL 892  884  98   BUN 8 - 23 mg/dL 13  14  13    Creatinine 0.44 - 1.00 mg/dL 9.21  9.17  9.04   Sodium 135 - 145 mmol/L 137  136  135   Potassium 3.5 - 5.1 mmol/L 4.3  4.0  4.3   Chloride 98 - 111 mmol/L 100  100  98   CO2 22 - 32 mmol/L 24  22  19    Calcium  8.9 - 10.3 mg/dL 9.9  9.8  89.5   Total Protein 6.5 - 8.1 g/dL 7.3  7.2  7.2   Total Bilirubin 0.0 - 1.2 mg/dL 0.4  0.4  0.4   Alkaline Phos 38 - 126 U/L 76  81  85   AST 15 - 41 U/L 17  19  21    ALT 0 - 44 U/L 8  12  15       No results found for: CEA1, CEA / No results found for: CEA1, CEA No results found for: PSA1 No results found for: CAN199 No results found for: CAN125  No results found for: TOTALPROTELP, ALBUMINELP, A1GS, A2GS, BETS, BETA2SER, GAMS, MSPIKE, SPEI Lab Results  Component Value Date   TIBC 384 05/30/2024   TIBC 531 (H) 04/14/2024    FERRITIN 135 05/30/2024   FERRITIN 5 (L) 04/14/2024   IRONPCTSAT 18 05/30/2024   IRONPCTSAT 7 (L) 04/14/2024   No results found for: LDH  STUDIES:   No results found.    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 Abigail Free, LOV 12/19/21 in Epic as of 03/15/22.   Dyslipidemia associated with type 2 diabetes mellitus [facility])    Patient follows with Dr. Abigail Free, LOV 12/19/21 in Epic as of 03/15/22.   Frequent UTI     h/o since hysterectomy/04/01/13 asymptomatic   GERD (gastroesophageal reflux disease)    Takes Omeprazole  daily as of 03/15/22.   Headache(784.0)    migraines   Hypertension    Currently on Lisinopril . Follows w/ PCP Dr. Abigail Free, ARNETTA 12/19/21 as of 03/15/22.   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 Overview:  The patient in 09/2009 underwent TAHBSO with the findings of a ruptured 10 cm right ovarian mass. 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    Past Surgical History:  Procedure Laterality Date   ABDOMINAL HYSTERECTOMY  2011   Stromal ovarian cancer   ANTERIOR AND POSTERIOR REPAIR WITH SACROSPINOUS FIXATION N/A 03/27/2022   Procedure: ANTERIOR REPAIR WITH SACROSPINOUS FIXATION;  Surgeon: Marilynne Rosaline SAILOR, MD;  Location: [facility];  Service: Gynecology;  Laterality: N/A;   CHOLECYSTECTOMY  1989   COLONOSCOPY  2010   no polyps per pt   CYSTOSCOPY N/A 03/27/2022   Procedure: CYSTOSCOPY;  Surgeon: Marilynne Rosaline SAILOR, MD;  Location: [facility];  Service: Gynecology;  Laterality: N/A;   ENTEROCELE REPAIR     around 2007   KNEE ARTHROSCOPY Right  many yrs ago before knee replacemnt   KNEE ARTHROSCOPY Right 07/01/2014   Procedure: RIGHT ARTHROSCOPY KNEE WITH SYNOVECTOMY;  Surgeon: Dempsey Melodi GAILS, MD;  Location: [facility];  Service: Orthopedics;  Laterality: Right;   RECTOCELE REPAIR     around 2007   TONSILLECTOMY  1975   TOTAL KNEE ARTHROPLASTY Right 04/07/2013   Procedure: RIGHT TOTAL KNEE ARTHROPLASTY;  Surgeon: Dempsey GAILS Melodi, MD;  Location: [facility];  Service: Orthopedics;  Laterality: Right;    Family History  Problem Relation Age of Onset   Hyperlipidemia Mother    Hypertension Mother    Transient ischemic attack Mother    Hyperlipidemia Father    Hypertension Father    Lung cancer Father    BRCA 1/2 Maternal Grandmother     Social History:  reports that she has never smoked. She has never used smokeless tobacco. She reports that she does not drink alcohol and does not use drugs.The patient is accompanied by husband today.  Allergies:  Allergies  Allergen Reactions   Amoxicillin Hives   Trulicity [Dulaglutide] Nausea And Vomiting   Citalopram Other (See Comments)    headache   Erythromycin Hives and Rash   Hydrocodone Other (See Comments)    Achy all over    Sertraline Other (See Comments)    irritability   Sulfa Antibiotics Rash    fever   Tramadol  Nausea Only and Palpitations   Trazodone And Nefazodone Other (See Comments)    Insomnia    Venlafaxine Other (See Comments)    migrane     Current Medications: Current Outpatient Medications  Medication Sig Dispense Refill   aspirin EC 81 MG tablet Take 81 mg by mouth every morning.     Atogepant  (QULIPTA ) 60 MG TABS Take 1 tablet (60 mg total) by mouth daily. 90 tablet 3   Calcium  Carb-Cholecalciferol (CALCIUM  600 + D PO) Take 1 tablet by mouth at bedtime.     chlorhexidine  (PERIDEX ) 0.12 % solution Take 15 mLs by Mouth Rinse route 2 (two) times daily. Gently rinse and spit. 473 mL 0   clindamycin  (CLEOCIN ) 300 MG capsule Take 1 capsule (300 mg total) by  mouth every 6 (six) hours. 28 capsule 0   diltiazem  (DILACOR XR ) 240 MG 24 hr capsule Take 1 capsule (240 mg total) by mouth daily. 90 capsule 1   Empagliflozin -metFORMIN  HCl ER (SYNJARDY  XR) 12.01-999 MG TB24 Take 2 tablets by mouth daily. 180 tablet 3   glucose blood (CONTOUR NEXT TEST) test strip Use as instructed 100 each 3   JUBLIA 10 % SOLN Apply topically.     Lancet Devices (MICROLET NEXT LANCING DEVICE) MISC 1 each by Does not apply route in the morning. 1 each 0   lisinopril  (ZESTRIL ) 40 MG tablet Take 1 tablet (40 mg total) by mouth daily. 90 tablet 3   LORazepam  (ATIVAN ) 0.5 MG tablet Take 1 tablet (0.5 mg total) by mouth 2 (two) times daily as needed for anxiety. 30 tablet 1   Magnesium 250 MG TABS Take by mouth in the morning and at bedtime.     omega-3 acid ethyl esters (LOVAZA ) 1 g capsule Take 2 capsules (2 g total) by mouth 2 (two) times daily. 360 capsule 3   omeprazole  (PRILOSEC) 20 MG capsule Take 1 capsule (20 mg total) by mouth daily before a meal. 90 capsule 3   ondansetron  (ZOFRAN ) 4 MG tablet Take 1 tablet (4 mg total) by mouth every 8 (eight) hours as needed for nausea  or vomiting. 40 tablet 0   pravastatin  (PRAVACHOL ) 10 MG tablet Take 1 tablet (10 mg total) by mouth daily. 90 tablet 3   Probiotic Product (PROBIOTIC BLEND PO) Take by mouth daily.     Semaglutide ,0.25 or 0.5MG /DOS, (OZEMPIC , 0.25 OR 0.5 MG/DOSE,) 2 MG/3ML SOPN INJECT 0.5 MG INTO THE SKIN ONE TIME PER WEEK 6 mL 3   SUMAtriptan  (IMITREX ) 100 MG tablet TAKE 1 TABLET BY MOUTH AT ONSET  OF MIGRAINE MAY REPEAT IN 2  HOURS AS NEEDED. MAX OF 2  TABLETS DAILY 27 tablet 1   zolpidem  (AMBIEN  CR) 12.5 MG CR tablet Take 1 tablet (12.5 mg total) by mouth at bedtime. 90 tablet 1   No current facility-administered medications for this visit.     ASSESSMENT & PLAN:   Assessment:  Lindsey [name] is a 68 y.o. female is here for elevated platelets. She was found to be iron  deficient and received IV Venofer . She  tolerated this well and notes improvement to her symptoms, mainly feeling less cold. She felt good over the holidays. Hemoglobin is slightly elevated at 15.1.  Her iron  studies are pending from today and I will call her with results this afternoon. If normal, will plan to follow in 3 months.   Plan: 1.  Return to clinic in 3 months, sooner if needed.   I discussed the assessment and treatment plan with the patient.  The patient was provided an opportunity to ask questions and all were answered.  The patient agreed with the plan and demonstrated an understanding of the instructions.    Thank you for the referral    20 minutes was spent in patient care.  This included time spent preparing to see the patient (e.g., review of tests), obtaining and/or reviewing separately obtained history, counseling and educating the patient/family/caregiver, ordering medications, tests, or procedures; documenting clinical information in the electronic or other health record, independently interpreting results and communicating results to the patient/family/caregiver as well as coordination of care.      Eleanor DELENA Bach, NP   Family Nurse Practitioner - Board Certified [facility] [place] [phone]     "

## 2024-09-01 NOTE — Telephone Encounter (Signed)
 Patient has been scheduled for follow-up visit per 09/01/2024 LOS.  Pt given an appt calendar with date and time.

## 2024-09-03 ENCOUNTER — Other Ambulatory Visit: Payer: Self-pay

## 2024-09-15 ENCOUNTER — Other Ambulatory Visit: Payer: Self-pay

## 2024-09-15 MED FILL — Pravastatin Sodium Tab 10 MG: ORAL | 90 days supply | Qty: 90 | Fill #2 | Status: AC

## 2024-09-18 ENCOUNTER — Other Ambulatory Visit: Payer: Self-pay

## 2024-09-18 ENCOUNTER — Other Ambulatory Visit (HOSPITAL_COMMUNITY): Payer: Self-pay

## 2024-09-22 ENCOUNTER — Other Ambulatory Visit (HOSPITAL_COMMUNITY): Payer: Self-pay

## 2024-09-23 ENCOUNTER — Other Ambulatory Visit: Payer: Self-pay | Admitting: Family Medicine

## 2024-12-01 ENCOUNTER — Inpatient Hospital Stay: Admitting: Hematology and Oncology

## 2024-12-01 ENCOUNTER — Inpatient Hospital Stay

## 2024-12-10 ENCOUNTER — Inpatient Hospital Stay: Admission: RE | Admit: 2024-12-10 | Source: Ambulatory Visit

## 2024-12-22 ENCOUNTER — Other Ambulatory Visit

## 2024-12-24 ENCOUNTER — Ambulatory Visit: Admitting: Family Medicine
